# Patient Record
Sex: Female | Born: 1985 | Race: White | Hispanic: No | Marital: Married | Smoking: Former smoker
Health system: Southern US, Community
[De-identification: ages and names within clinical notes are randomized; demographics above are authoritative.]

## PROBLEM LIST (undated history)

## (undated) ENCOUNTER — Inpatient Hospital Stay (HOSPITAL_COMMUNITY): Payer: Self-pay

## (undated) DIAGNOSIS — O24419 Gestational diabetes mellitus in pregnancy, unspecified control: Secondary | ICD-10-CM

## (undated) DIAGNOSIS — F329 Major depressive disorder, single episode, unspecified: Secondary | ICD-10-CM

## (undated) DIAGNOSIS — F411 Generalized anxiety disorder: Secondary | ICD-10-CM

## (undated) DIAGNOSIS — R87810 Cervical high risk human papillomavirus (HPV) DNA test positive: Secondary | ICD-10-CM

## (undated) DIAGNOSIS — R55 Syncope and collapse: Secondary | ICD-10-CM

## (undated) DIAGNOSIS — Z87891 Personal history of nicotine dependence: Secondary | ICD-10-CM

## (undated) DIAGNOSIS — I4510 Unspecified right bundle-branch block: Secondary | ICD-10-CM

## (undated) DIAGNOSIS — Z8759 Personal history of other complications of pregnancy, childbirth and the puerperium: Secondary | ICD-10-CM

## (undated) DIAGNOSIS — I491 Atrial premature depolarization: Secondary | ICD-10-CM

## (undated) DIAGNOSIS — F419 Anxiety disorder, unspecified: Secondary | ICD-10-CM

## (undated) DIAGNOSIS — F32A Depression, unspecified: Secondary | ICD-10-CM

## (undated) DIAGNOSIS — O24415 Gestational diabetes mellitus in pregnancy, controlled by oral hypoglycemic drugs: Secondary | ICD-10-CM

## (undated) DIAGNOSIS — F988 Other specified behavioral and emotional disorders with onset usually occurring in childhood and adolescence: Secondary | ICD-10-CM

## (undated) DIAGNOSIS — R87612 Low grade squamous intraepithelial lesion on cytologic smear of cervix (LGSIL): Secondary | ICD-10-CM

## (undated) HISTORY — DX: Low grade squamous intraepithelial lesion on cytologic smear of cervix (LGSIL): R87.612

## (undated) HISTORY — DX: Other specified behavioral and emotional disorders with onset usually occurring in childhood and adolescence: F98.8

## (undated) HISTORY — DX: Unspecified right bundle-branch block: I45.10

## (undated) HISTORY — DX: Atrial premature depolarization: I49.1

## (undated) HISTORY — DX: Syncope and collapse: R55

## (undated) HISTORY — DX: Depression, unspecified: F32.A

## (undated) HISTORY — DX: Major depressive disorder, single episode, unspecified: F32.9

## (undated) HISTORY — DX: Cervical high risk human papillomavirus (HPV) DNA test positive: R87.810

## (undated) HISTORY — DX: Gestational diabetes mellitus in pregnancy, unspecified control: O24.419

## (undated) HISTORY — DX: Anxiety disorder, unspecified: F41.9

## (undated) HISTORY — DX: Personal history of nicotine dependence: Z87.891

## (undated) HISTORY — DX: Gestational diabetes mellitus in pregnancy, controlled by oral hypoglycemic drugs: O24.415

## (undated) HISTORY — DX: Generalized anxiety disorder: F41.1

## (undated) HISTORY — PX: FRACTURE SURGERY: SHX138

---
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
## 2007-12-20 ENCOUNTER — Ambulatory Visit: Payer: Self-pay | Admitting: Psychiatry

## 2007-12-20 ENCOUNTER — Other Ambulatory Visit: Payer: Self-pay

## 2008-09-14 ENCOUNTER — Ambulatory Visit: Payer: Self-pay | Admitting: Psychiatry

## 2010-01-09 ENCOUNTER — Emergency Department: Payer: Self-pay | Admitting: Emergency Medicine

## 2012-05-09 ENCOUNTER — Ambulatory Visit (INDEPENDENT_AMBULATORY_CARE_PROVIDER_SITE_OTHER): Payer: BC Managed Care – PPO | Admitting: Family Medicine

## 2012-05-09 VITALS — BP 100/68 | HR 83 | Temp 98.9°F | Resp 16 | Ht 66.0 in | Wt 126.0 lb

## 2012-05-09 DIAGNOSIS — IMO0001 Reserved for inherently not codable concepts without codable children: Secondary | ICD-10-CM

## 2012-05-09 DIAGNOSIS — Z0000 Encounter for general adult medical examination without abnormal findings: Secondary | ICD-10-CM

## 2012-05-09 LAB — POCT URINALYSIS DIPSTICK
Bilirubin, UA: NEGATIVE
Blood, UA: NEGATIVE
Glucose, UA: NEGATIVE
Ketones, UA: NEGATIVE
Leukocytes, UA: NEGATIVE
Nitrite, UA: NEGATIVE
Protein, UA: NEGATIVE
Spec Grav, UA: 1.015
Urobilinogen, UA: 1
pH, UA: 7.5

## 2012-05-09 LAB — POCT CBC
Granulocyte percent: 64.8 %G (ref 37–80)
HCT, POC: 46.3 % (ref 37.7–47.9)
Hemoglobin: 14.6 g/dL (ref 12.2–16.2)
Lymph, poc: 2.6 (ref 0.6–3.4)
MCH, POC: 30.2 pg (ref 27–31.2)
MCHC: 31.5 g/dL — AB (ref 31.8–35.4)
MCV: 95.9 fL (ref 80–97)
MID (cbc): 0.5 (ref 0–0.9)
MPV: 9.8 fL (ref 0–99.8)
POC Granulocyte: 5.7 (ref 2–6.9)
POC LYMPH PERCENT: 29.9 %L (ref 10–50)
POC MID %: 5.3 %M (ref 0–12)
Platelet Count, POC: 288 10*3/uL (ref 142–424)
RBC: 4.83 M/uL (ref 4.04–5.48)
RDW, POC: 12.5 %
WBC: 8.8 10*3/uL (ref 4.6–10.2)

## 2012-05-09 LAB — COMPREHENSIVE METABOLIC PANEL
ALT: 9 U/L (ref 0–35)
AST: 13 U/L (ref 0–37)
Albumin: 4.5 g/dL (ref 3.5–5.2)
Alkaline Phosphatase: 38 U/L — ABNORMAL LOW (ref 39–117)
BUN: 10 mg/dL (ref 6–23)
CO2: 30 mEq/L (ref 19–32)
Calcium: 9.6 mg/dL (ref 8.4–10.5)
Chloride: 103 mEq/L (ref 96–112)
Creat: 0.63 mg/dL (ref 0.50–1.10)
Glucose, Bld: 74 mg/dL (ref 70–99)
Potassium: 4.2 mEq/L (ref 3.5–5.3)
Sodium: 139 mEq/L (ref 135–145)
Total Bilirubin: 0.3 mg/dL (ref 0.3–1.2)
Total Protein: 7.1 g/dL (ref 6.0–8.3)

## 2012-05-09 LAB — LIPID PANEL
Cholesterol: 124 mg/dL (ref 0–200)
HDL: 47 mg/dL (ref >39)
LDL Cholesterol: 63 mg/dL (ref 0–99)
Total CHOL/HDL Ratio: 2.6 Ratio
Triglycerides: 69 mg/dL (ref <150)
VLDL: 14 mg/dL (ref 0–40)

## 2012-05-09 LAB — POCT URINE PREGNANCY: Preg Test, Ur: NEGATIVE

## 2012-05-09 MED ORDER — NORETHINDRONE ACET-ETHINYL EST 1.5-30 MG-MCG PO TABS: 1.0000 | ORAL_TABLET | Freq: Every day | ORAL | Status: DC

## 2012-05-09 NOTE — Progress Notes (Signed)
@[facility] @  Patient ID: Zoe Wiley MRN: [id number], DOB: 1986-02-24, 26 y.o. Date of Encounter: 05/09/2012, 3:45 PM  Primary Physician: No primary provider on file.  Chief Complaint: Physical (CPE)  HPI: 26 y.o. y/o female with history of noted below here for CPE.  Doing well. C/o spotting midcycle x 2 cycles, no cramps  LMP: 10 days ago, spotting now Pap:  1 year ago MMG:  N/A Review of Systems: Consitutional: No fever, chills, fatigue, night sweats, lymphadenopathy, or weight changes. Eyes: No visual changes, eye redness, or discharge. ENT/Mouth: Ears: No otalgia, tinnitus, hearing loss, discharge. Nose: No congestion, rhinorrhea, sinus pain, or epistaxis. Throat: No sore throat, post nasal drip, or teeth pain. Cardiovascular: No CP, palpitations, diaphoresis, DOE, edema, orthopnea, PND. Respiratory: No cough, hemoptysis, SOB, or wheezing. Gastrointestinal: No anorexia, dysphagia, reflux, pain, nausea, vomiting, hematemesis, diarrhea, constipation, BRBPR, or melena. Breast: No discharge, pain, swelling, or mass. Genitourinary: No dysuria, frequency, urgency, hematuria, incontinence, nocturia, amenorrhea, vaginal discharge, pruritis, burning, abnormal bleeding, or pain. Musculoskeletal: No decreased ROM, myalgias, stiffness, joint swelling, or weakness. Skin: No rash, erythema, lesion changes, pain, warmth, jaundice, or pruritis. Neurological: No headache, dizziness, syncope, seizures, tremors, memory loss, coordination problems, or paresthesias. Psychological: No anxiety, depression, hallucinations, SI/HI. Endocrine: No fatigue, polydipsia, polyphagia, polyuria, or known diabetes. All other systems were reviewed and are otherwise negative.  No past medical history on file.   No past surgical history on file.  Home Meds:  Prior to Admission medications   Medication Sig Start Date End Date Taking? Authorizing Provider  amphetamine-dextroamphetamine (ADDERALL) 30 MG tablet Take  30 mg by mouth as needed.   Yes Historical Provider, MD  clonazePAM (KLONOPIN) 0.5 MG tablet Take 0.5 mg by mouth 2 (two) times daily as needed.   Yes Historical Provider, MD  Norethindrone Acetate-Ethinyl Estradiol (JUNEL,LOESTRIN,MICROGESTIN) 1.5-30 MG-MCG tablet Take 1 tablet by mouth daily. 05/09/12 05/09/13  Elvina Sidle, MD    Allergies: No Known Allergies  History   Social History  . Marital Status: Single    Spouse Name: N/A    Number of Children: N/A  . Years of Education: N/A   Occupational History  . Not on file.   Social History Main Topics  . Smoking status: Current Everyday Smoker -- 1.0 packs/day    Types: Cigarettes  . Smokeless tobacco: Not on file  . Alcohol Use: Not on file  . Drug Use: Not on file  . Sexually Active: Not on file   Other Topics Concern  . Not on file   Social History Narrative  . No narrative on file    No family history on file.  Physical Exam: Blood pressure 100/68, pulse 83, temperature 98.9 F (37.2 C), temperature source Oral, resp. rate 16, height 5\' 6"  (1.676 m), weight 126 lb (57.153 kg), last menstrual period 05/08/2012, SpO2 100.00%., Body mass index is 20.34 kg/(m^2). General: Well developed, well nourished, in no acute distress. HEENT: Normocephalic, atraumatic. Conjunctiva pink, sclera non-icteric. Pupils 2 mm constricting to 1 mm, round, regular, and equally reactive to light and accomodation. EOMI. Internal auditory canal clear. TMs with good cone of light and without pathology. Nasal mucosa pink. Nares are without discharge. No sinus tenderness. Oral mucosa pink. Dentition good. Pharynx without exudate.   Neck: Supple. Trachea midline. No thyromegaly. Full ROM. No lymphadenopathy. Lungs: Clear to auscultation bilaterally without wheezes, rales, or rhonchi. Breathing is of normal effort and unlabored. Cardiovascular: RRR with S1 S2. No murmurs, rubs, or gallops appreciated. Distal pulses 2+  symmetrically. No carotid or  abdominal bruit Abdomen: Soft, non-tender, non-distended with normoactive bowel sounds. No hepatosplenomegaly or masses. No rebound/guarding. No CVA tenderness. Without hernias.  Genitourinary:  External genitalia without lesions. Vaginal mucosa pink. Cervix pink and without discharge. No cervical or adnexal tenderness. Pap smear taken Musculoskeletal: Full range of motion and 5/5 strength throughout. Without swelling, atrophy, tenderness, crepitus, or warmth. Extremities without clubbing, cyanosis, or edema. Calves supple. Skin: Warm and moist without erythema, ecchymosis, wounds, or rash. Neuro: A+Ox3. CN II-XII grossly intact. Moves all extremities spontaneously. Full sensation throughout. Normal gait. DTR 2+ throughout upper and lower extremities. Finger to nose intact. Psych:  Responds to questions appropriately with a normal affect.   Studies:  Patient is healthy except for the spotting   Assessment/Plan:  26 y.o. y/o female here for CPE 1. Contraception  Norethindrone Acetate-Ethinyl Estradiol (JUNEL,LOESTRIN,MICROGESTIN) 1.5-30 MG-MCG tablet, POCT CBC, Comprehensive metabolic panel  2. Routine general medical examination at a health care facility  POCT CBC, Comprehensive metabolic panel, POCT urinalysis dipstick, POCT urine pregnancy, Pap IG w/ reflex to HPV when ASC-U    -  Signed, Elvina Sidle, MD 05/09/2012 3:45 PM

## 2012-05-10 ENCOUNTER — Encounter: Payer: Self-pay | Admitting: Family Medicine

## 2012-05-10 LAB — PAP IG W/ RFLX HPV ASCU

## 2012-06-21 ENCOUNTER — Telehealth: Payer: Self-pay

## 2012-06-21 NOTE — Telephone Encounter (Signed)
Please pull chart.

## 2012-06-21 NOTE — Telephone Encounter (Signed)
PT STATES SHE CANNOT GET IN TO SEE HER DR UNTIL October, BUT IS OUT OF HER MEDS AND WOULD LIKE TO KNOW IF WE WOULD GIVE HER THE ADDERALL 10MG S AND THE ADDERALL TIME RELEASE XR 30MG S, ALONG WITH HER KOLONOPIN PLEASE CALL [phone]

## 2012-06-22 NOTE — Telephone Encounter (Signed)
No paper chart, epic only.

## 2012-06-22 NOTE — Telephone Encounter (Signed)
We have never seen you for this before. Sorry not without a formal evaluation.

## 2012-06-22 NOTE — Telephone Encounter (Signed)
LMOM for pt that we would be happy to see her and eval her for these medications and gave her our walk -in hours, but explained that we can not write these Rxs for her w/out ever having seen her for this before.

## 2012-06-25 ENCOUNTER — Ambulatory Visit (INDEPENDENT_AMBULATORY_CARE_PROVIDER_SITE_OTHER): Payer: BC Managed Care – PPO | Admitting: Internal Medicine

## 2012-06-25 VITALS — BP 117/73 | HR 77 | Temp 98.7°F | Resp 16 | Ht 66.0 in | Wt 129.0 lb

## 2012-06-25 DIAGNOSIS — F988 Other specified behavioral and emotional disorders with onset usually occurring in childhood and adolescence: Secondary | ICD-10-CM

## 2012-06-25 DIAGNOSIS — F411 Generalized anxiety disorder: Secondary | ICD-10-CM

## 2012-06-25 HISTORY — DX: Generalized anxiety disorder: F41.1

## 2012-06-25 MED ORDER — CLONAZEPAM 0.5 MG PO TABS: 0.5000 mg | ORAL_TABLET | Freq: Two times a day (BID) | ORAL | Status: DC | PRN

## 2012-06-25 MED ORDER — AMPHETAMINE-DEXTROAMPHET ER 30 MG PO CP24: 30.0000 mg | ORAL_CAPSULE | ORAL | Status: DC

## 2012-06-25 MED ORDER — AMPHETAMINE-DEXTROAMPHETAMINE 10 MG PO TABS: 10.0000 mg | ORAL_TABLET | Freq: Every day | ORAL | Status: DC

## 2012-06-25 NOTE — Progress Notes (Signed)
  Subjective:    Patient ID: Zoe Wiley, female    DOB: April 27, 1986, 26 y.o.   MRN: [id number]  HPIHere for followup for problems= Patient Active Problem List  Diagnosis  . ADD (attention deficit disorder)  . Generalized anxiety disorder  Diagnosed by psychiatry in [place] about 18 months ago after a stormy time With anxiety and depression And poor school performance. Problems with her mom in the aftermath of the divorce. Mom also with cerebral palsy and cervical disc disease needing support. Zoe Wiley Was considered as ADD in elementary school but mom refused medications. She had mediocre performance through high school. She dropped out after 3 years at [employer]. She returned to [employer] years ago but did poorly and was placed on academic probation until her eventual presentation to psychiatrist Theador Hawthorne. Since being on medication for attention deficit disorder she has had straight A's and made the Dean's list every time. Her anxiety has greatly improved. She initially did not respond to 4 different SSRIs or other medications but did respond to Klonopin twice a day. She now only uses this once in the morning before work. 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 She is nearing the end of the business degree at [employer]. She is also now a good relationship for over one year No signs of depression/anxiety only about work/no insomnia Adderall 30 XRT the a.m. With occasional 10 mg doses in the p.m. Her heavy days of work. Her college curriculum is now on line.    Past medical history-recent physical examination here documents everything/all normal  Review of Systems     Objective:   Physical Exam In no acute distress Normal vital signs Neuropsychiatric stable       Assessment & Plan:  Problem #1 attention deficit disorder Problem #2 generalized anxiety secondary to situation   Her psychiatrist is moving and  she needs followup here Meds ordered this encounter  Medications  . amphetamine-dextroamphetamine (ADDERALL XR) 30 MG 24 hr capsule    Sig: Take 1 capsule (30 mg total) by mouth every morning.    Dispense:  30 capsule    Refill:  0  . amphetamine-dextroamphetamine (ADDERALL XR) 30 MG 24 hr capsule    Sig: Take 1 capsule (30 mg total) by mouth every morning.    Dispense:  30 capsule    Refill:  0   4 after 07/25/2012  . amphetamine-dextroamphetamine (ADDERALL XR) 30 MG 24 hr capsule    Sig: Take 1 capsule (30 mg total) by mouth every morning.    Dispense:  30 capsule    Refill:  04 after 08/25/2012  . amphetamine-dextroamphetamine (ADDERALL) 10 MG tablet    Sig: Take 1 tablet (10 mg total) by mouth daily.    Dispense:  30 tablet    Refill:  0  . amphetamine-dextroamphetamine (ADDERALL) 10 MG tablet    Sig: Take 1 tablet (10 mg total) by mouth daily.    Dispense:  30 tablet    Refill:  0Full       4 after 07/25/2012  . clonazePAM (KLONOPIN) 0.5 MG tablet    Sig: Take 1 tablet (0.5 mg total) by mouth 2 (two) times daily as needed for anxiety.    Dispense:  60 tablet    Refill:  2   Followup 3 months or 104

## 2012-06-27 NOTE — Progress Notes (Signed)
Left msg for pt to call to schedule appt 09/14/12. Zoe Wiley

## 2012-09-14 ENCOUNTER — Encounter: Payer: Self-pay | Admitting: Internal Medicine

## 2012-09-14 ENCOUNTER — Ambulatory Visit (INDEPENDENT_AMBULATORY_CARE_PROVIDER_SITE_OTHER): Payer: BC Managed Care – PPO | Admitting: Internal Medicine

## 2012-09-14 VITALS — BP 110/74 | HR 89 | Temp 98.1°F | Resp 16 | Ht 66.0 in | Wt 125.4 lb

## 2012-09-14 DIAGNOSIS — F988 Other specified behavioral and emotional disorders with onset usually occurring in childhood and adolescence: Secondary | ICD-10-CM

## 2012-09-14 DIAGNOSIS — F419 Anxiety disorder, unspecified: Secondary | ICD-10-CM

## 2012-09-14 DIAGNOSIS — F411 Generalized anxiety disorder: Secondary | ICD-10-CM

## 2012-09-14 MED ORDER — AMPHETAMINE-DEXTROAMPHET ER 30 MG PO CP24: 30.0000 mg | ORAL_CAPSULE | ORAL | Status: DC

## 2012-09-14 MED ORDER — AMPHETAMINE-DEXTROAMPHET ER 30 MG PO CP24
30.0000 mg | ORAL_CAPSULE | ORAL | Status: DC
Start: 2012-09-14 — End: 2013-04-03

## 2012-09-14 MED ORDER — CLONAZEPAM 0.5 MG PO TABS: 0.5000 mg | ORAL_TABLET | Freq: Two times a day (BID) | ORAL | Status: DC | PRN

## 2012-09-14 MED ORDER — AMPHETAMINE-DEXTROAMPHETAMINE 10 MG PO TABS
10.0000 mg | ORAL_TABLET | Freq: Every day | ORAL | Status: DC
Start: 2012-09-14 — End: 2012-11-03

## 2012-09-14 MED ORDER — AMPHETAMINE-DEXTROAMPHETAMINE 10 MG PO TABS: 10.0000 mg | ORAL_TABLET | Freq: Every day | ORAL | Status: DC

## 2012-09-14 NOTE — Progress Notes (Signed)
  Subjective:    Patient ID: Zoe Wiley, female    DOB: March 22, 1986, 26 y.o.   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 in the business program expecting to graduate in 2-3 years unless she can accelerate her course load Her anxiety is improving slowly over these past few years as she comes to grips with growing up with an alcoholic mother and having an unstable family/she uses Klonopin 5 or 6 times a week occasionally before work and occasionally for sleep  Stable relationship Arts and crafts are her passion [employer]-bored with her job and hopes to make changes as she graduates Continues to [employer] ready to change Review of Systems No vision changes or headaches No tremor No chest pain or palpitations No weight loss Adderall does not change appetite or sleep habits No irritability    Objective:   Physical Exam Vital signs stable Pupils equal round reactive to light and accommodation/EOMs conjugate Heart regular Neurological intact Psychological intact     Assessment & Plan:   1. ADD (attention deficit disorder)  amphetamine-dextroamphetamine (ADDERALL XR) 30 MG 24 hr capsule, amphetamine-dextroamphetamine (ADDERALL XR) 30 MG 24 hr capsule,for 10/14/12 amphetamine-dextroamphetamine (ADDERALL XR) 30 MG 24 hr capsule,for2/3/14 amphetamine-dextroamphetamine (ADDERALL) 10 MG tablet, amphetamine-dextroamphetamine (ADDERALL) 10 MG tablet for after 10/14/12  2. Anxiety  clonazePAM (KLONOPIN) 0.5 MG tablet 2 ref  3.  Nic addic to be addressed at next OV  If doing well in 3 months she may call for refills of all her medications for another 3 months with followup in 6 months

## 2012-10-24 NOTE — Progress Notes (Signed)
Completed prior auth for pt's Adderall 10 mg and Adderall XR 30 mg over the phone and received approval for 1 year. Faxed notice of approval to pharmacy.

## 2012-11-03 ENCOUNTER — Ambulatory Visit (INDEPENDENT_AMBULATORY_CARE_PROVIDER_SITE_OTHER): Payer: BC Managed Care – PPO | Admitting: Physician Assistant

## 2012-11-03 VITALS — BP 115/75 | HR 76 | Temp 98.5°F | Resp 16 | Ht 66.0 in | Wt 125.0 lb

## 2012-11-03 DIAGNOSIS — R1110 Vomiting, unspecified: Secondary | ICD-10-CM

## 2012-11-03 DIAGNOSIS — J111 Influenza due to unidentified influenza virus with other respiratory manifestations: Secondary | ICD-10-CM

## 2012-11-03 DIAGNOSIS — R110 Nausea: Secondary | ICD-10-CM

## 2012-11-03 DIAGNOSIS — R5383 Other fatigue: Secondary | ICD-10-CM

## 2012-11-03 DIAGNOSIS — R5381 Other malaise: Secondary | ICD-10-CM

## 2012-11-03 LAB — POCT CBC
Granulocyte percent: 53.8 %G (ref 37–80)
MID (cbc): 0.4 (ref 0–0.9)
MPV: 9.7 fL (ref 0–99.8)
POC Granulocyte: 3.7 (ref 2–6.9)
POC LYMPH PERCENT: 40.4 %L (ref 10–50)
Platelet Count, POC: 238 10*3/uL (ref 142–424)
RBC: 4.26 M/uL (ref 4.04–5.48)
RDW, POC: 12.6 %

## 2012-11-03 LAB — POCT INFLUENZA A/B
Influenza A, POC: NEGATIVE
Influenza B, POC: NEGATIVE

## 2012-11-03 MED ORDER — OSELTAMIVIR PHOSPHATE 75 MG PO CAPS: 75.0000 mg | ORAL_CAPSULE | Freq: Two times a day (BID) | ORAL | Status: DC

## 2012-11-03 MED ORDER — ONDANSETRON 4 MG PO TBDP
4.0000 mg | ORAL_TABLET | Freq: Once | ORAL | Status: AC
  Administered 2012-11-03: 4 mg via ORAL

## 2012-11-03 MED ORDER — ONDANSETRON 4 MG PO TBDP: 4.0000 mg | ORAL_TABLET | Freq: Three times a day (TID) | ORAL | Status: DC | PRN

## 2012-11-03 MED ORDER — ONDANSETRON 4 MG PO TBDP: 8.0000 mg | ORAL_TABLET | Freq: Once | ORAL | Status: DC

## 2012-11-03 NOTE — Progress Notes (Signed)
Patient ID: Zoe Wiley MRN: [id number], DOB: [birth year], 27 y.o. Date of Encounter: 11/03/2012, 3:43 PM  Primary Physician: No primary provider on file.  Chief Complaint: Fatigue, congestion, and myalgias for one day  HPI: 27 y.o. year old female with history below presents with nasal congestion, fatigue, myalgias, and emesis for one day. Patient woke this morning with sudden onset of the above. Tried to go to work, but could not make it through the day. Afebrile. No chills. Complains of nasal congestion, rhinorrhea, and sinus pressure. No sore throat or cough. No shortness of breath or wheezing. No chest pain. One episode of of emesis earlier today. Still with some nausea, without recent emesis though. One episode of diarrhea this afternoon prior to her arrival here. Multiple sick contacts at work with similar symptoms and with influenza. She did not receive the flu vaccine this year. She refuses to get the flu shot since 2009 secondary to getting H1N1 that year after getting the vaccine. She has taken Dayquil today without any relief.    Past Medical History  Diagnosis Date  . Anxiety      Home Meds: Prior to Admission medications   Medication Sig Start Date End Date Taking? Authorizing Provider  amphetamine-dextroamphetamine (ADDERALL XR) 30 MG 24 hr capsule Take 1 capsule (30 mg total) by mouth every morning. 09/14/12  Yes Tonye Pearson, MD  amphetamine-dextroamphetamine (ADDERALL) 10 MG tablet Take 1 tablet (10 mg total) by mouth daily. 09/14/12  Yes Tonye Pearson, MD  clonazePAM (KLONOPIN) 0.5 MG tablet Take 0.5 mg by mouth once.   Yes Historical Provider, MD         Norethindrone Acetate-Ethinyl Estradiol (JUNEL,LOESTRIN,MICROGESTIN) 1.5-30 MG-MCG tablet Take 1 tablet by mouth daily. 05/09/12 05/09/13  Elvina Sidle, MD    Allergies: No Known Allergies  History   Social History  . Marital Status: Single    Spouse Name: N/A    Number of Children: N/A  . Years of  Education: N/A   Occupational History  . Not on file.   Social History Main Topics  . Smoking status: Current Every Day Smoker -- 0.5 packs/day    Types: Cigarettes  . Smokeless tobacco: Not on file  . Alcohol Use: Not on file  . Drug Use: Not on file  . Sexually Active: Not on file   Other Topics Concern  . Not on file   Social History Narrative  . No narrative on file     Review of Systems: Constitutional: positive for fatigue. negative for chills, fever, night sweats, or weight changes HEENT: see above Cardiovascular: negative for chest pain or palpitations Respiratory: negative for hemoptysis, wheezing, shortness of breath, or cough Abdominal: positive for nausea, vomiting, and diarrhea. negative for abdominal pain, BRBPR, or melena Dermatological: negative for rash Neurologic: positive for headache negative for dizziness, or syncope All other systems reviewed and are otherwise negative with the exception to those above and in the HPI.   Physical Exam: Blood pressure 115/75, pulse 76, temperature 98.5 F (36.9 C), temperature source Oral, resp. rate 16, height 5\' 6"  (1.676 m), weight 125 lb (56.7 kg), SpO2 100.00%., Body mass index is 20.18 kg/(m^2). General: Well developed, well nourished, in no acute distress. Not toxic appearing.  Head: Normocephalic, atraumatic, eyes without discharge, sclera non-icteric, nares are congested. Bilateral auditory canals clear, TM's are without perforation, pearly grey and translucent with reflective cone of light bilaterally. Serous effusion bilaterally. Oral cavity moist, posterior pharynx with post nasal drip. No  exudate, erythema, or peritonsillar abscess. Uvula midline.  Neck: Supple. No thyromegaly. Full ROM. No lymphadenopathy. No nuchal rigidity.  Lungs: Clear bilaterally to auscultation without wheezes, rales, or rhonchi. Breathing is unlabored. Heart: RRR with S1 S2. No murmurs, rubs, or gallops appreciated. Msk:  Strength and  tone normal for age. Extremities/Skin: Warm and dry. No clubbing or cyanosis. No edema. No rashes or suspicious lesions. Neuro: Alert and oriented X 3. Moves all extremities spontaneously. Gait is normal. CNII-XII grossly in tact. Psych:  Responds to questions appropriately with a normal affect.   Labs: Results for orders placed in visit on 11/03/12  POCT CBC      Component Value Range   WBC 6.9  4.6 - 10.2 K/uL   Lymph, poc 2.8  0.6 - 3.4   POC LYMPH PERCENT 40.4  10 - 50 %L   MID (cbc) 0.4  0 - 0.9   POC MID % 5.8  0 - 12 %M   POC Granulocyte 3.7  2 - 6.9   Granulocyte percent 53.8  37 - 80 %G   RBC 4.26  4.04 - 5.48 M/uL   Hemoglobin 12.8  12.2 - 16.2 g/dL   HCT, POC 14.7  82.9 - 47.9 %   MCV 94.6  80 - 97 fL   MCH, POC 30.0  27 - 31.2 pg   MCHC 31.8  31.8 - 35.4 g/dL   RDW, POC 56.2     Platelet Count, POC 238  142 - 424 K/uL   MPV 9.7  0 - 99.8 fL  POCT INFLUENZA A/B      Component Value Range   Influenza A, POC Negative     Influenza B, POC Negative       ASSESSMENT AND PLAN:  27 y.o. year old female with ILI, nausea, emesis, and diarrhea. -Empiric treatment with Tamiflu 75 mg 1 po bid #10 no RF -Zofran ODT 4 mg x 2 given in office -Fluids -Rest -Out of work 11/04/12 -Return to clinic precautions  Signed, Eula Listen, PA-C 11/03/2012 3:43 PM

## 2012-11-16 ENCOUNTER — Telehealth: Payer: Self-pay

## 2012-11-16 NOTE — Telephone Encounter (Signed)
PT SAYS SHE LOST HER SCRIPT FOR CLONAZEPAM AND WANTS TO KNOW IF [facility] WILL CALL HER IN A REFILL. [phone]

## 2012-11-20 MED ORDER — CLONAZEPAM 0.5 MG PO TABS: 0.5000 mg | ORAL_TABLET | Freq: Once | ORAL | Status: DC

## 2012-11-20 NOTE — Telephone Encounter (Signed)
Pt notified that rx was sent to pharm.

## 2012-11-20 NOTE — Telephone Encounter (Signed)
Meds ordered this encounter  Medications  . clonazePAM (KLONOPIN) 0.5 MG tablet    Sig: Take 1 tablet (0.5 mg total) by mouth once.    Dispense:  30 tablet    Refill:  2

## 2012-12-12 ENCOUNTER — Telehealth: Payer: Self-pay | Admitting: Radiology

## 2012-12-12 NOTE — Telephone Encounter (Signed)
Patient states the Klonopin that was filled was for #30 she is requesting quantity of #60 please advise.

## 2012-12-12 NOTE — Telephone Encounter (Signed)
Looks like pt has previously been prescribed #60 for BID use, but Dr. Merla Riches will have to address this as he is the prescriber.  I will forward to him

## 2012-12-13 ENCOUNTER — Other Ambulatory Visit: Payer: Self-pay | Admitting: Radiology

## 2012-12-13 MED ORDER — CLONAZEPAM 0.5 MG PO TABS: 0.5000 mg | ORAL_TABLET | Freq: Two times a day (BID) | ORAL | Status: DC | PRN

## 2012-12-13 NOTE — Telephone Encounter (Signed)
She is correct/My mistake  Meds ordered this encounter  Medications  . clonazePAM (KLONOPIN) 0.5 MG tablet    Sig: Take 1 tablet (0.5 mg total) by mouth 2 (two) times daily as needed for anxiety.    Dispense:  60 tablet    Refill:  2

## 2013-04-03 ENCOUNTER — Ambulatory Visit: Payer: BC Managed Care – PPO | Admitting: Internal Medicine

## 2013-04-03 VITALS — BP 104/72 | HR 82 | Temp 98.1°F | Resp 16 | Ht 66.0 in | Wt 126.8 lb

## 2013-04-03 DIAGNOSIS — F411 Generalized anxiety disorder: Secondary | ICD-10-CM

## 2013-04-03 DIAGNOSIS — F988 Other specified behavioral and emotional disorders with onset usually occurring in childhood and adolescence: Secondary | ICD-10-CM

## 2013-04-03 DIAGNOSIS — F17200 Nicotine dependence, unspecified, uncomplicated: Secondary | ICD-10-CM

## 2013-04-03 MED ORDER — AMPHETAMINE-DEXTROAMPHET ER 30 MG PO CP24: 30.0000 mg | ORAL_CAPSULE | ORAL | Status: DC

## 2013-04-03 MED ORDER — CLONAZEPAM 0.5 MG PO TABS: 0.5000 mg | ORAL_TABLET | Freq: Two times a day (BID) | ORAL | Status: DC | PRN

## 2013-04-03 MED ORDER — AMPHETAMINE-DEXTROAMPHETAMINE 10 MG PO TABS: 10.0000 mg | ORAL_TABLET | Freq: Every day | ORAL | Status: DC

## 2013-04-03 NOTE — Progress Notes (Signed)
  Subjective:    Patient ID: Zoe Wiley, female    DOB: 14-Nov-1985, 27 y.o.   MRN: [id number]  HPI followup  Patient Active Problem List   Diagnosis Date Noted  . Nicotine addiction--1ppd to 1ppweek//has quit plan- 09/14/2012  . 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 is already selling things on [employer]//good relationship for past 2 years --he is well employed and they are considering eloping  Needing only long-acting Adderall since no longer in school  06/25/2012  . Generalized anxiety disorder--improving /no longer requiring Klonopin daily /has come to terms with her family particularly her mother though still struggles with guilt  06/25/2012   No other issues  Review of Systems No headache or vision changes Chest pain or palpitations No weight loss or fatigue No tremor    Objective:   Physical Exam BP 104/72  Pulse 82  Temp(Src) 98.1 F (36.7 C) (Oral)  Resp 16  Ht 5\' 6"  (1.676 m)  Wt 126 lb 12.8 oz (57.516 kg)  BMI 20.48 kg/m2  SpO2 100%  LMP 03/27/2013 Pupils equal round reactive to light and accommodation/EOMs conjugate  Heart regular Cranial nerves II through XII intact Gait normal Psychiatric normal       Assessment & Plan:  ADD (attention deficit disorder) -  Plan: amphetamine-dextroamphetamine (ADDERALL XR) 30 MG 24 hr capsule, amphetamine-dextroamphetamine (ADDERALL XR) 30 MG 24 hr capsule, amphetamine-dextroamphetamine (ADDERALL XR) 30 MG 24 hr capsule, amphetamine-dextroamphetamine (ADDERALL) 10 MG tablet amphetamine-dextroamphetamine (ADDERALL) 10 MG tablet  Generalized anxiety disorder - Plan: clonazePAM (KLONOPIN) 0.5 MG tablet  Nicotine  addiction--improving  Discussed the value of an ADD brain Call for refills in 3 months in followup in 6 months

## 2013-05-02 ENCOUNTER — Other Ambulatory Visit: Payer: Self-pay | Admitting: Internal Medicine

## 2013-05-03 NOTE — Telephone Encounter (Signed)
Needs office visit.

## 2013-05-30 ENCOUNTER — Other Ambulatory Visit: Payer: Self-pay | Admitting: Physician Assistant

## 2013-06-05 ENCOUNTER — Telehealth: Payer: Self-pay

## 2013-06-05 MED ORDER — NORETHINDRONE ACET-ETHINYL EST 1.5-30 MG-MCG PO TABS: 1.0000 | ORAL_TABLET | Freq: Every day | ORAL | Status: DC

## 2013-06-05 NOTE — Telephone Encounter (Signed)
Sent!

## 2013-06-05 NOTE — Telephone Encounter (Signed)
Pt is scheduled for a CPE with Dr Merla Riches on 08/02/13. Pt is asking if she can get 1 month refill on birth control

## 2013-06-21 ENCOUNTER — Telehealth: Payer: Self-pay

## 2013-06-21 MED ORDER — NORETHINDRONE ACET-ETHINYL EST 1.5-30 MG-MCG PO TABS: 1.0000 | ORAL_TABLET | Freq: Every day | ORAL | Status: DC

## 2013-06-21 NOTE — Telephone Encounter (Signed)
lmom that rx was sent in and to make sure she keeps her appt.

## 2013-06-21 NOTE — Telephone Encounter (Signed)
Patient has an appt with Dr. Merla Riches on Oct.22nd but her birth control runs out this week. Wants to know if she can get an extension on them; one more refill. [facility]. [phone].

## 2013-06-21 NOTE — Telephone Encounter (Signed)
Pended please advise.  

## 2013-06-21 NOTE — Telephone Encounter (Signed)
Sent!

## 2013-06-26 ENCOUNTER — Ambulatory Visit (INDEPENDENT_AMBULATORY_CARE_PROVIDER_SITE_OTHER): Payer: BC Managed Care – PPO | Admitting: Emergency Medicine

## 2013-06-26 VITALS — BP 120/70 | HR 72 | Temp 98.7°F | Resp 16 | Ht 65.0 in | Wt 133.0 lb

## 2013-06-26 DIAGNOSIS — J209 Acute bronchitis, unspecified: Secondary | ICD-10-CM

## 2013-06-26 MED ORDER — AZITHROMYCIN 250 MG PO TABS: ORAL_TABLET | ORAL | Status: DC

## 2013-06-26 MED ORDER — HYDROCOD POLST-CHLORPHEN POLST 10-8 MG/5ML PO LQCR: 5.0000 mL | Freq: Two times a day (BID) | ORAL | Status: DC | PRN

## 2013-06-26 MED ORDER — PSEUDOEPHEDRINE-GUAIFENESIN ER 60-600 MG PO TB12: 1.0000 | ORAL_TABLET | Freq: Two times a day (BID) | ORAL | Status: DC

## 2013-06-26 NOTE — Progress Notes (Signed)
[facility] [address], [place] [address] [phone]  Date:  06/26/2013   Name:  Zoe Wiley   DOB:  1986-01-19   MRN:  [id number]  PCP:  No PCP Per Patient    Chief Complaint: URI   History of Present Illness:  Karuna Balducci is a 27 y.o. very pleasant female patient who presents with the following:  Ill since last week with nasal congestion, sore throat, post nasal drainage and cough productive of a foul tasting sputum.  No wheezing or shortness of breath.  Has frontal headache.  No nausea or vomiting.  No stool change.  No rash.  No improvement with over the counter medications or other home remedies. Denies other complaint or health concern today.   Patient Active Problem List   Diagnosis Date Noted  . Nicotine addiction 09/14/2012  . ADD (attention deficit disorder) 06/25/2012  . Generalized anxiety disorder 06/25/2012    Past Medical History  Diagnosis Date  . Anxiety     Past Surgical History  Procedure Laterality Date  . Fracture surgery      History  Substance Use Topics  . Smoking status: Current Every Day Smoker -- 0.50 packs/day for 7 years    Types: Cigarettes  . Smokeless tobacco: Not on file  . Alcohol Use: Not on file    Family History  Problem Relation Age of Onset  . Heart disease Father     No Known Allergies  Medication list has been reviewed and updated.  Current Outpatient Prescriptions on File Prior to Visit  Medication Sig Dispense Refill  . amphetamine-dextroamphetamine (ADDERALL XR) 30 MG 24 hr capsule Take 1 capsule (30 mg total) by mouth every morning.  30 capsule  0  . amphetamine-dextroamphetamine (ADDERALL XR) 30 MG 24 hr capsule Take 1 capsule (30 mg total) by mouth every morning.  30 capsule  0  . amphetamine-dextroamphetamine (ADDERALL XR) 30 MG 24 hr capsule Take 1 capsule (30 mg total) by mouth every morning.  30 capsule  0  . amphetamine-dextroamphetamine (ADDERALL) 10 MG tablet Take 1 tablet (10  mg total) by mouth daily.  30 tablet  0  . clonazePAM (KLONOPIN) 0.5 MG tablet Take 1 tablet (0.5 mg total) by mouth 2 (two) times daily as needed for anxiety.  60 tablet  2  . Norethindrone Acetate-Ethinyl Estradiol (MICROGESTIN) 1.5-30 MG-MCG tablet Take 1 tablet by mouth daily. PATIENT NEEDS OFFICE VISIT FOR ADDITIONAL REFILLS  21 tablet  1   No current facility-administered medications on file prior to visit.    Review of Systems:  As per HPI, otherwise negative.    Physical Examination: Filed Vitals:   06/26/13 2042  BP: 120/70  Pulse: 72  Temp: 98.7 F (37.1 C)  Resp: 16   Filed Vitals:   06/26/13 2042  Height: 5\' 5"  (1.651 m)  Weight: 133 lb (60.328 kg)   Body mass index is 22.13 kg/(m^2). Ideal Body Weight: Weight in (lb) to have BMI = 25: 149.9  GEN: WDWN, NAD, Non-toxic, A & O x 3 HEENT: Atraumatic, Normocephalic. Neck supple. No masses, No LAD. Ears and Nose: No external deformity. CV: RRR, No M/G/R. No JVD. No thrill. No extra heart sounds. PULM: CTA B, no wheezes, crackles, rhonchi. No retractions. No resp. distress. No accessory muscle use. ABD: S, NT, ND, +BS. No rebound. No HSM. EXTR: No c/c/e NEURO Normal gait.  PSYCH: Normally interactive. Conversant. Not depressed or anxious appearing.  Calm demeanor.  Assessment and Plan: Upper respiratory infection Bronchitis tussionex mucinex d  Signed,  Phillips Odor, MD

## 2013-06-26 NOTE — Patient Instructions (Addendum)

## 2013-07-10 ENCOUNTER — Ambulatory Visit: Payer: BC Managed Care – PPO | Admitting: Family Medicine

## 2013-07-10 VITALS — BP 112/70 | HR 77 | Temp 98.0°F | Resp 16 | Ht 67.0 in | Wt 129.0 lb

## 2013-07-10 DIAGNOSIS — J3489 Other specified disorders of nose and nasal sinuses: Secondary | ICD-10-CM

## 2013-07-10 DIAGNOSIS — J309 Allergic rhinitis, unspecified: Secondary | ICD-10-CM

## 2013-07-10 MED ORDER — AMOXICILLIN 500 MG PO CAPS: 1000.0000 mg | ORAL_CAPSULE | Freq: Two times a day (BID) | ORAL | Status: DC

## 2013-07-10 MED ORDER — FLUTICASONE PROPIONATE 50 MCG/ACT NA SUSP: 2.0000 | Freq: Every day | NASAL | Status: DC

## 2013-07-10 NOTE — Patient Instructions (Addendum)
You probably have allergies. Try using the nasal spray, and also use claritin or zyrtec OTC.  If you are not feeling better in the next few days you can also start the amoxicillin rx (antiboitc).  Please let me know if you are not feeling better soon- Sooner if worse.

## 2013-07-10 NOTE — Progress Notes (Signed)
[facility] [address], [place] [address] [phone]  Date:  07/10/2013   Name:  Zoe Wiley   DOB:  [birth year]   MRN:  [id number]  PCP:  No PCP Per Patient    Chief Complaint: Nasal Congestion and Cough   History of Present Illness:  Zoe Wiley is a 27 y.o. very pleasant female patient who presents with the following:  She is here today for a recheck- see OV from 9/15.  She notes sneezing, congestion, stuffy and runny nose.  She has not noted a fever.  She does have some cough. She was treated with azithromycin, mucinex and tussionex at her last visit.  She felt better while she was using these medications, but now her sx seem to have recurred (as of the last 3 days). No GI symptoms.   Her LMP is current.   Her sx are now more in her sinuses- they had been more in her chest  Patient Active Problem List   Diagnosis Date Noted  . Nicotine addiction 09/14/2012  . ADD (attention deficit disorder) 06/25/2012  . Generalized anxiety disorder 06/25/2012    Past Medical History  Diagnosis Date  . Anxiety     Past Surgical History  Procedure Laterality Date  . Fracture surgery      History  Substance Use Topics  . Smoking status: Current Every Day Smoker -- 0.50 packs/day for 7 years    Types: Cigarettes  . Smokeless tobacco: Not on file  . Alcohol Use: Not on file    Family History  Problem Relation Age of Onset  . Heart disease Father     Not on File  Medication list has been reviewed and updated.  Current Outpatient Prescriptions on File Prior to Visit  Medication Sig Dispense Refill  . amphetamine-dextroamphetamine (ADDERALL XR) 30 MG 24 hr capsule Take 1 capsule (30 mg total) by mouth every morning.  30 capsule  0  . amphetamine-dextroamphetamine (ADDERALL XR) 30 MG 24 hr capsule Take 1 capsule (30 mg total) by mouth every morning.  30 capsule  0  . amphetamine-dextroamphetamine (ADDERALL XR) 30 MG 24 hr capsule Take 1 capsule (30  mg total) by mouth every morning.  30 capsule  0  . amphetamine-dextroamphetamine (ADDERALL) 10 MG tablet Take 1 tablet (10 mg total) by mouth daily.  30 tablet  0  . clonazePAM (KLONOPIN) 0.5 MG tablet Take 1 tablet (0.5 mg total) by mouth 2 (two) times daily as needed for anxiety.  60 tablet  2  . Norethindrone Acetate-Ethinyl Estradiol (MICROGESTIN) 1.5-30 MG-MCG tablet Take 1 tablet by mouth daily. PATIENT NEEDS OFFICE VISIT FOR ADDITIONAL REFILLS  21 tablet  1  . azithromycin (ZITHROMAX) 250 MG tablet Take 2 tabs PO x 1 dose, then 1 tab PO QD x 4 days  6 tablet  0  . chlorpheniramine-HYDROcodone (TUSSIONEX PENNKINETIC ER) 10-8 MG/5ML LQCR Take 5 mLs by mouth every 12 (twelve) hours as needed.  60 mL  0  . pseudoephedrine-guaifenesin (MUCINEX D) 60-600 MG per tablet Take 1 tablet by mouth every 12 (twelve) hours.  18 tablet  0   No current facility-administered medications on file prior to visit.    Review of Systems:  As per HPI- otherwise negative.   Physical Examination: Filed Vitals:   07/10/13 1057  BP: 100/60  Pulse: 77  Temp: 98 F (36.7 C)  Resp: 16   Filed Vitals:   07/10/13 1057  Height: 5\' 7"  (1.702 m)  Weight: 129 lb (58.514 kg)   Body mass index is 20.2 kg/(m^2). Ideal Body Weight: Weight in (lb) to have BMI = 25: 159.3  GEN: WDWN, NAD, Non-toxic, A & O x 3 HEENT: Atraumatic, Normocephalic. Neck supple. No masses, No LAD.  Bilateral TM wnl, oropharynx normal.  PEERL,EOMI.   Nasal cavity is inflamed and sinuses are tender to palpation Ears and Nose: No external deformity. CV: RRR, No M/G/R. No JVD. No thrill. No extra heart sounds. PULM: CTA B, no wheezes, crackles, rhonchi. No retractions. No resp. distress. No accessory muscle use.Marland Kitchen EXTR: No c/c/e NEURO Normal gait.  PSYCH: Normally interactive. Conversant. Not depressed or anxious appearing.  Calm demeanor.    Assessment and Plan: Allergic rhinitis - Plan: fluticasone (FLONASE) 50 MCG/ACT nasal  spray  Sinus pain - Plan: amoxicillin (AMOXIL) 500 MG capsule  Suspect viral sinus infection vs allergies.  Recommend that she try flonase and zyrtec or claritin first- if not better soon can take a course of amox.  Let me know if any problems.    Signed Abbe Amsterdam, MD

## 2013-08-02 ENCOUNTER — Ambulatory Visit (INDEPENDENT_AMBULATORY_CARE_PROVIDER_SITE_OTHER): Payer: BC Managed Care – PPO | Admitting: Internal Medicine

## 2013-08-02 ENCOUNTER — Encounter: Payer: Self-pay | Admitting: Internal Medicine

## 2013-08-02 VITALS — BP 110/72 | HR 73 | Temp 97.9°F | Resp 16 | Ht 65.5 in | Wt 130.0 lb

## 2013-08-02 DIAGNOSIS — F411 Generalized anxiety disorder: Secondary | ICD-10-CM

## 2013-08-02 DIAGNOSIS — Z309 Encounter for contraceptive management, unspecified: Secondary | ICD-10-CM

## 2013-08-02 DIAGNOSIS — D235 Other benign neoplasm of skin of trunk: Secondary | ICD-10-CM

## 2013-08-02 DIAGNOSIS — D225 Melanocytic nevi of trunk: Secondary | ICD-10-CM

## 2013-08-02 DIAGNOSIS — IMO0001 Reserved for inherently not codable concepts without codable children: Secondary | ICD-10-CM

## 2013-08-02 DIAGNOSIS — F988 Other specified behavioral and emotional disorders with onset usually occurring in childhood and adolescence: Secondary | ICD-10-CM

## 2013-08-02 DIAGNOSIS — Z0000 Encounter for general adult medical examination without abnormal findings: Secondary | ICD-10-CM

## 2013-08-02 LAB — POCT URINALYSIS DIPSTICK
Bilirubin, UA: NEGATIVE
Blood, UA: NEGATIVE
Glucose, UA: NEGATIVE
Nitrite, UA: NEGATIVE
Spec Grav, UA: 1.015
pH, UA: 7

## 2013-08-02 MED ORDER — NORETHINDRONE ACET-ETHINYL EST 1.5-30 MG-MCG PO TABS: 1.0000 | ORAL_TABLET | Freq: Every day | ORAL | Status: DC

## 2013-08-02 MED ORDER — AMPHETAMINE-DEXTROAMPHET ER 30 MG PO CP24: 30.0000 mg | ORAL_CAPSULE | ORAL | Status: DC

## 2013-08-02 MED ORDER — CLONAZEPAM 0.5 MG PO TABS: 0.5000 mg | ORAL_TABLET | Freq: Two times a day (BID) | ORAL | Status: DC | PRN

## 2013-08-02 MED ORDER — AMPHETAMINE-DEXTROAMPHETAMINE 10 MG PO TABS: 10.0000 mg | ORAL_TABLET | Freq: Every day | ORAL | Status: DC

## 2013-08-02 MED ORDER — FLUOXETINE HCL 10 MG PO CAPS: 10.0000 mg | ORAL_CAPSULE | Freq: Every day | ORAL | Status: DC

## 2013-08-02 NOTE — Progress Notes (Signed)
Subjective:    Patient ID: Zoe Wiley, female    DOB: [birth year], 27 y.o.   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 Nicotine addiction----Has quit this year!!!! 09/14/2012  . ADD (attention deficit disorder)---meds working well 06/25/2012  . Generalized anxiety disorder 06/25/2012   Anxiety long term-always afr to try meds --reluctant to use more than 1/2 -1 benzo in am incr agitat Snaps at bf Short at work Small panic attacks Unsure why the recent increase  Review of Systems 13point all neg other than pi    Objective:   Physical Exam  Constitutional: She is oriented to person, place, and time. She appears well-developed and well-nourished.  HENT:  Right Ear: External ear normal.  Left Ear: External ear normal.  Nose: Nose normal.  Mouth/Throat: Oropharynx is clear and moist.  Eyes: Conjunctivae and EOM are normal. Pupils are equal, round, and reactive to light.  Neck: Normal range of motion. Neck supple. No thyromegaly present.  Cardiovascular: Normal rate, regular rhythm, normal heart sounds and intact distal pulses.   No murmur heard. Pulmonary/Chest: Breath sounds normal. No respiratory distress. She has no wheezes.    Marland Kitchen8 cm atypical nevus with central clearing and rolled outer edges that are discrete but sl irreg  Both breasts are otherwise normal to exam Small dark nevus lower outer quad L breast as well but more normal appearance  Abdominal: Soft. Bowel sounds are normal. There is no splenomegaly or hepatomegaly. Hernia confirmed negative in the right inguinal area and confirmed negative in the left inguinal area.  Genitourinary: Vagina normal. Pelvic exam was performed with patient supine. There is  no rash or lesion on the right labia. There is no rash or lesion on the left labia.  Uterus midposit NT No adnexal masses or tenderness  Musculoskeletal: Normal range of motion. She exhibits no edema and no tenderness.  Lymphadenopathy:    She has no cervical adenopathy.  Neurological: She is alert and oriented to person, place, and time. She has normal reflexes. No cranial nerve deficit.  Skin: Rash noted.  Small hives and blotches around neck come and go  Psychiatric: She has a normal mood and affect. Her behavior is normal. Judgment and thought content normal.   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 Norethindrone Acetate-Ethinyl Estradiol (MICROGESTIN) 1.5-30 MG-MCG tablet    Sig: Take 1 tablet by mouth daily.  Dispense:  21 tablet    Refill:  1  . clonazePAM (KLONOPIN) 0.5 MG tablet    Sig: Take 1 tablet (0.5 mg total) by mouth 2 (two) times daily as needed for anxiety.    Dispense:  60 tablet    Refill:  2  . amphetamine-dextroamphetamine (ADDERALL XR) 30 MG 24 hr capsule    Sig: Take 1 capsule (30 mg total) by mouth every morning.    Dispense:  30 capsule    Refill:  0  . amphetamine-dextroamphetamine (ADDERALL XR) 30 MG 24 hr capsule    Sig: Take 1 capsule (30 mg  total) by mouth every morning.    Dispense:  30 capsule    Refill:  0  . amphetamine-dextroamphetamine (ADDERALL XR) 30 MG 24 hr capsule    Sig: Take 1 capsule (30 mg total) by mouth every morning.    Dispense:  30 capsule    Refill:  0  . amphetamine-dextroamphetamine (ADDERALL) 10 MG tablet    Sig: Take 1 tablet (10 mg total) by mouth daily.    Dispense:  30 tablet    Refill:  0  . FLUoxetine (PROZAC) 10 MG capsule    Sig: Take 1 capsule (10 mg total) by mouth daily.    Dispense:  30 capsule    Refill:  5

## 2013-08-03 ENCOUNTER — Other Ambulatory Visit: Payer: Self-pay | Admitting: Radiology

## 2013-08-03 ENCOUNTER — Telehealth: Payer: Self-pay | Admitting: Internal Medicine

## 2013-08-03 DIAGNOSIS — D235 Other benign neoplasm of skin of trunk: Secondary | ICD-10-CM

## 2013-08-03 NOTE — Telephone Encounter (Signed)
Left message that dr Irene Limbo at [facility] derm can see her at 230 today [facility] scheduling nurse [phone] option 1

## 2013-08-04 LAB — PAP IG, CT-NG, RFX HPV ASCU: GC Probe Amp: NEGATIVE

## 2013-08-10 ENCOUNTER — Telehealth: Payer: Self-pay | Admitting: Internal Medicine

## 2013-08-10 DIAGNOSIS — IMO0002 Reserved for concepts with insufficient information to code with codable children: Secondary | ICD-10-CM

## 2013-08-10 NOTE — Telephone Encounter (Signed)
i discussed with her

## 2013-09-06 ENCOUNTER — Telehealth: Payer: Self-pay

## 2013-09-06 MED ORDER — FLUOXETINE HCL 20 MG PO TABS: 20.0000 mg | ORAL_TABLET | Freq: Every day | ORAL | Status: DC

## 2013-09-06 NOTE — Telephone Encounter (Signed)
PT STATES DR DOOLITTLE PUT HER ON PROZAC FOR ONCE A DAY AND TOLD HER TO CALL IF SHE NEEDED TO TAKE MORE AND SHE IS TAKING TWICE A DAY AND DIDN'T KNOW IF THE MGS NEEDED TO BE CHANGED. PLEASE CALL [phone]    [facility] ON [address] IN [facility]

## 2013-09-06 NOTE — Telephone Encounter (Signed)
Meds ordered this encounter  Medications  . FLUoxetine (PROZAC) 20 MG tablet    Sig: Take 1 tablet (20 mg total) by mouth daily.    Dispense:  30 tablet    Refill:  3   Can increase again in 1-2 months if needed

## 2013-09-06 NOTE — Telephone Encounter (Signed)
Pended at higher dose please advise.

## 2013-09-08 NOTE — Telephone Encounter (Signed)
Called her to advise, left mssg for her to call me back.

## 2013-09-11 NOTE — Telephone Encounter (Signed)
Called again. Left another, detailed message.

## 2013-10-13 ENCOUNTER — Telehealth: Payer: Self-pay

## 2013-10-13 NOTE — Telephone Encounter (Signed)
Pt would like to increase her prozac from 20 mg to 30 mg. [facility] [address] [employer] [phone]

## 2013-10-13 NOTE — Telephone Encounter (Signed)
Pended please advise.  

## 2013-10-14 MED ORDER — FLUOXETINE HCL 20 MG PO TABS: 30.0000 mg | ORAL_TABLET | Freq: Every day | ORAL | Status: DC

## 2013-10-14 NOTE — Telephone Encounter (Signed)
Ok proz 30--to f/u if questions or in 1-2 months

## 2013-10-16 NOTE — Telephone Encounter (Signed)
Thanks. Called her to advise.

## 2013-11-10 ENCOUNTER — Ambulatory Visit: Payer: BC Managed Care – PPO | Admitting: Internal Medicine

## 2014-01-03 ENCOUNTER — Ambulatory Visit (INDEPENDENT_AMBULATORY_CARE_PROVIDER_SITE_OTHER): Payer: BC Managed Care – PPO | Admitting: Internal Medicine

## 2014-01-03 ENCOUNTER — Encounter: Payer: Self-pay | Admitting: Internal Medicine

## 2014-01-03 VITALS — BP 130/72 | HR 79 | Temp 98.2°F | Resp 16 | Ht 66.5 in | Wt 129.2 lb

## 2014-01-03 DIAGNOSIS — F411 Generalized anxiety disorder: Secondary | ICD-10-CM

## 2014-01-03 DIAGNOSIS — F988 Other specified behavioral and emotional disorders with onset usually occurring in childhood and adolescence: Secondary | ICD-10-CM

## 2014-01-03 MED ORDER — NORETHIN ACE-ETH ESTRAD-FE 1-20 MG-MCG PO TABS: 1.0000 | ORAL_TABLET | Freq: Every day | ORAL | Status: DC

## 2014-01-03 MED ORDER — FLUOXETINE HCL 40 MG PO CAPS: 40.0000 mg | ORAL_CAPSULE | Freq: Every day | ORAL | Status: DC

## 2014-01-03 MED ORDER — FLUOXETINE HCL 20 MG PO TABS: 30.0000 mg | ORAL_TABLET | Freq: Every day | ORAL | Status: DC

## 2014-01-03 MED ORDER — AMPHETAMINE-DEXTROAMPHETAMINE 10 MG PO TABS: 10.0000 mg | ORAL_TABLET | Freq: Two times a day (BID) | ORAL | Status: DC

## 2014-01-03 NOTE — Progress Notes (Signed)
Subjective:    Patient ID: Zoe Wiley, female    DOB: 21-Jun-1986, 28 y.o.   MRN: [id number]  HPI followup Patient Active Problem List   Diagnosis Date Noted  . Generalized anxiety disorder 06/25/2012    Priority: Medium  . ADD (attention deficit disorder) 06/25/2012   30xr=too much now that she is on prozac and her anxiety is well controlled  She has discontinued this and now takes 10 mg in the morning which seems to last all day  Occasionally takes an evening dose  She has no procrastination, no disorganization, no hyperactive or impulsive symptoms   Meds were started by psychiatrist who told her not to be too concerned about the increased   anxiety on this medication  She now but rarely needs Klonopin, maybe once or twice a week Quit smoking oct 2014 Diet soda 2-3/day is current project to discontinue Steady BF--anticipating marriage/ childrearing 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  Eyes: Negative for visual disturbance.  Respiratory: Negative for shortness of breath.   Cardiovascular: Negative for chest pain and palpitations.  Neurological: Negative for headaches.  Psychiatric/Behavioral: Negative for behavioral problems and sleep disturbance.       Objective:   Physical Exam BP 130/72  Pulse 79  Temp(Src) 98.2 F (36.8 C) (Oral)  Resp 16  Ht 5' 6.5" (1.689 m)  Wt 129 lb 3.2 oz (58.605 kg)  BMI 20.54 kg/m2  SpO2 98%  LMP 01/02/2014 HEENT clear/no  thyromegaly Heart regular Neurological intact Mood good/affect appropriate       Assessment & Plan:  Generalized anxiety disorder  Increase Prozac to 40 mg  Change birth control back to original pills-Junel  Continue Klonopin when necessary  ADD (attention deficit disorder)  Discontinue long-acting Adderall and use Adderall 10 mg in the morning/second dose prn  She is encouraged to consider using 5 mg and even discontinuing medication to see if her  attention deficit disorder was not an accurate diagnosis  Meds ordered this encounter  Medications  . DISCONTD: FLUoxetine (PROZAC) 20 MG tablet    Sig: Take 1.5 tablets (30 mg total) by mouth daily.    Dispense:  45 tablet    Refill:  3  . FLUoxetine (PROZAC) 40 MG capsule    Sig: Take 1 capsule (40 mg total) by mouth daily.    Dispense:  90 capsule    Refill:  3  . norethindrone-ethinyl estradiol (JUNEL FE 1/20) 1-20 MG-MCG tablet    Sig: Take 1 tablet by mouth daily.    Dispense:  1 Package    Refill:  11  . amphetamine-dextroamphetamine (ADDERALL) 10 MG tablet    Sig: Take 1 tablet (10 mg total) by mouth 2 (two) times daily.    Dispense:  60 tablet    Refill:  0  . amphetamine-dextroamphetamine (ADDERALL) 10 MG tablet    Sig: Take 1 tablet (10 mg total) by mouth 2 (two) times daily. 30 d from signed date    Dispense:  8 tablet  Refill:  0  . amphetamine-dextroamphetamine (ADDERALL) 10 MG tablet    Sig: Take 1 tablet (10 mg total) by mouth 2 (two) times daily. 60d from signed date    Dispense:  60 tablet    Refill:  0

## 2014-03-19 ENCOUNTER — Ambulatory Visit (INDEPENDENT_AMBULATORY_CARE_PROVIDER_SITE_OTHER): Payer: BC Managed Care – PPO | Admitting: Family Medicine

## 2014-03-19 VITALS — BP 128/72 | HR 83 | Temp 98.2°F | Resp 16 | Ht 65.0 in | Wt 127.4 lb

## 2014-03-19 DIAGNOSIS — T783XXA Angioneurotic edema, initial encounter: Secondary | ICD-10-CM

## 2014-03-19 DIAGNOSIS — H01119 Allergic dermatitis of unspecified eye, unspecified eyelid: Secondary | ICD-10-CM

## 2014-03-19 MED ORDER — HYDROXYZINE HCL 25 MG PO TABS: 12.5000 mg | ORAL_TABLET | Freq: Three times a day (TID) | ORAL | Status: DC | PRN

## 2014-03-19 MED ORDER — CETIRIZINE HCL 10 MG PO TABS: 10.0000 mg | ORAL_TABLET | Freq: Every day | ORAL | Status: DC

## 2014-03-19 MED ORDER — RANITIDINE HCL 150 MG PO TABS: 150.0000 mg | ORAL_TABLET | Freq: Two times a day (BID) | ORAL | Status: DC

## 2014-03-19 MED ORDER — METHYLPREDNISOLONE ACETATE 80 MG/ML IJ SUSP
80.0000 mg | Freq: Once | INTRAMUSCULAR | Status: AC
  Administered 2014-03-19: 80 mg via INTRAMUSCULAR

## 2014-03-19 NOTE — Patient Instructions (Signed)
Angioedema Angioedema is sudden puffiness (swelling), often of the skin. It can happen:  On your face or privates (genitals).  In your belly (abdomen) or other body parts. It usually happens quickly and gets better in 1 or 2 days. It often starts at night and is found when you wake up. You may get red, itchy patches of skin (hives). Attacks can be dangerous if your breathing passages get puffy. The condition may happen only once, or it can come back at random times. It may happen for several years before it goes away for good. HOME CARE  Only take medicines as told by your doctor.  Always carry your emergency allergy medicines with you.  Wear a medical bracelet as told by your doctor.  Avoid things that you know will cause attacks (triggers). GET HELP IF:  You have another attack.  Your attacks happen more often or get worse.  The condition was passed to you by your parents and you want to have children. GET HELP RIGHT AWAY IF:   Your mouth, tongue, or lips are very puffy.  You have trouble breathing.  You have trouble swallowing.  You pass out (faint). MAKE SURE YOU:   Understand these instructions.  Will watch your condition.  Will get help right away if you are not doing well or get worse. Document Released: 09/16/2009 Document Revised: 07/19/2013 Document Reviewed: 05/22/2013 [facility] Patient Information 2014 [place], [place].

## 2014-03-24 ENCOUNTER — Other Ambulatory Visit: Payer: Self-pay | Admitting: Internal Medicine

## 2014-03-24 NOTE — Telephone Encounter (Signed)
PT CALLED IN WANTING A REFILL ON HER clonazePAM (KLONOPIN) 0.5 MG tablet STATED DR. DOOLITTLE TOLD HER TO CALL IN.   PLEASE RETURN HER CALL AT [phone]

## 2014-03-27 NOTE — Telephone Encounter (Signed)
Faxed and notified pt on VM. 

## 2014-05-07 ENCOUNTER — Telehealth: Payer: Self-pay

## 2014-05-07 NOTE — Telephone Encounter (Signed)
amphetamine-dextroamphetamine (ADDERALL) 10 MG tablet   Patient is coming in on Saturday Aug 1st to see Dr. Laney Pastor,  Meanwhile she is out of meds.    [phone]

## 2014-05-08 NOTE — Telephone Encounter (Signed)
Please advise 

## 2014-05-09 MED ORDER — AMPHETAMINE-DEXTROAMPHETAMINE 10 MG PO TABS: 10.0000 mg | ORAL_TABLET | Freq: Two times a day (BID) | ORAL | Status: DC

## 2014-05-09 NOTE — Telephone Encounter (Signed)
Notified pt Rx is ready on VM.

## 2014-06-01 NOTE — Progress Notes (Signed)
Subjective:    Patient ID: Zoe Wiley, female    DOB: 1986-06-03, 28 y.o.   MRN: [id number] Chief Complaint  Patient presents with  . Allergic Reaction    thinks she is having a reaction to the eye make up she tried on x 2 days    HPI  Past Medical History  Diagnosis Date  . Anxiety    Current Outpatient Prescriptions on File Prior to Visit  Medication Sig Dispense Refill  . amphetamine-dextroamphetamine (ADDERALL) 10 MG tablet Take 1 tablet (10 mg total) by mouth 2 (two) times daily. 30 d from signed date  60 tablet  0  . amphetamine-dextroamphetamine (ADDERALL) 10 MG tablet Take 1 tablet (10 mg total) by mouth 2 (two) times daily. 60d from signed date  60 tablet  0  . Norethindrone Acetate-Ethinyl Estradiol (MICROGESTIN) 1.5-30 MG-MCG tablet Take 1 tablet by mouth daily.  21 tablet  11  . norethindrone-ethinyl estradiol (JUNEL FE 1/20) 1-20 MG-MCG tablet Take 1 tablet by mouth daily.  1 Package  11   No current facility-administered medications on file prior to visit.   No Known Allergies   Review of Systems  Constitutional: Negative for fever, chills, diaphoresis, activity change, appetite change and fatigue.  HENT: Positive for congestion. Negative for ear discharge, ear pain, nosebleeds, postnasal drip, rhinorrhea, sinus pressure, sneezing, sore throat, trouble swallowing and voice change.   Eyes: Positive for itching. Negative for photophobia, pain, discharge, redness and visual disturbance.       Lids swollen  Respiratory: Negative for cough and shortness of breath.   Cardiovascular: Negative for chest pain.  Gastrointestinal: Negative for nausea, vomiting and abdominal pain.  Musculoskeletal: Negative for neck pain and neck stiffness.  Skin: Positive for color change and rash.  Neurological: Negative for dizziness, syncope and headaches.  Hematological: Negative for adenopathy.  Psychiatric/Behavioral: Negative for sleep disturbance.       Objective:  BP  128/72  Pulse 83  Temp(Src) 98.2 F (36.8 C) (Oral)  Resp 16  Ht 5\' 5"  (1.651 m)  Wt 127 lb 6.4 oz (57.788 kg)  BMI 21.20 kg/m2  SpO2 100%  LMP 03/13/2014  Physical Exam  Constitutional: She is oriented to person, place, and time. She appears well-developed and well-nourished. She does not appear ill. No distress.  HENT:  Head: Normocephalic and atraumatic.  Right Ear: External ear and ear canal normal. Tympanic membrane is retracted. A middle ear effusion is present.  Left Ear: External ear and ear canal normal. Tympanic membrane is retracted. A middle ear effusion is present.  Nose: Mucosal edema and rhinorrhea present. Right sinus exhibits maxillary sinus tenderness. Left sinus exhibits maxillary sinus tenderness.  Mouth/Throat: Uvula is midline and mucous membranes are normal. Posterior oropharyngeal erythema present. No oropharyngeal exudate, posterior oropharyngeal edema or tonsillar abscesses.  Eyes: Conjunctivae and EOM are normal. Pupils are equal, round, and reactive to light. Right eye exhibits no chemosis and no discharge. Left eye exhibits no chemosis and no discharge. No scleral icterus.  Lids swollen  Neck: Normal range of motion. Neck supple.  Cardiovascular: Normal rate, regular rhythm, normal heart sounds and intact distal pulses.   Pulmonary/Chest: Effort normal and breath sounds normal.  Lymphadenopathy:       Head (right side): No submandibular, no tonsillar, no preauricular and no posterior auricular adenopathy present.       Head (left side): No submandibular, no tonsillar, no preauricular and no posterior auricular adenopathy present.    She has no  cervical adenopathy.       Right: No supraclavicular adenopathy present.       Left: No supraclavicular adenopathy present.  Neurological: She is alert and oriented to person, place, and time.  Skin: Skin is warm and dry. She is not diaphoretic. No erythema.  Psychiatric: She has a normal mood and affect. Her behavior  is normal.          Assessment & Plan:   Allergic dermatitis eyelid - Plan: methylPREDNISolone acetate (DEPO-MEDROL) injection 80 mg  Allergic angioedema  Meds ordered this encounter  Medications  . FLUoxetine (PROZAC) 40 MG capsule    Sig: Take 20 mg by mouth daily.  . ranitidine (ZANTAC) 150 MG tablet    Sig: Take 1 tablet (150 mg total) by mouth 2 (two) times daily.    Dispense:  60 tablet    Refill:  1  . cetirizine (ZYRTEC) 10 MG tablet    Sig: Take 1 tablet (10 mg total) by mouth daily.    Dispense:  30 tablet    Refill:  1  . methylPREDNISolone acetate (DEPO-MEDROL) injection 80 mg    Sig:   . hydrOXYzine (ATARAX/VISTARIL) 25 MG tablet    Sig: Take 0.5-1 tablets (12.5-25 mg total) by mouth every 8 (eight) hours as needed for itching.    Dispense:  30 tablet    Refill:  0     Delman Cheadle, MD MPH

## 2014-07-02 ENCOUNTER — Ambulatory Visit (INDEPENDENT_AMBULATORY_CARE_PROVIDER_SITE_OTHER): Payer: BC Managed Care – PPO | Admitting: Internal Medicine

## 2014-07-02 VITALS — BP 124/70 | HR 70 | Temp 99.0°F | Resp 16 | Ht 66.0 in | Wt 130.0 lb

## 2014-07-02 DIAGNOSIS — N920 Excessive and frequent menstruation with regular cycle: Secondary | ICD-10-CM

## 2014-07-02 DIAGNOSIS — N921 Excessive and frequent menstruation with irregular cycle: Secondary | ICD-10-CM

## 2014-07-02 DIAGNOSIS — F988 Other specified behavioral and emotional disorders with onset usually occurring in childhood and adolescence: Secondary | ICD-10-CM

## 2014-07-02 DIAGNOSIS — F411 Generalized anxiety disorder: Secondary | ICD-10-CM

## 2014-07-02 MED ORDER — NORGESTIMATE-ETH ESTRADIOL 0.25-35 MG-MCG PO TABS: 1.0000 | ORAL_TABLET | Freq: Every day | ORAL | Status: DC

## 2014-07-02 MED ORDER — CLONAZEPAM 0.5 MG PO TABS: ORAL_TABLET | ORAL | Status: DC

## 2014-07-02 MED ORDER — FLUOXETINE HCL 40 MG PO CAPS: 40.0000 mg | ORAL_CAPSULE | Freq: Every day | ORAL | Status: DC

## 2014-07-02 NOTE — Progress Notes (Signed)
Subjective:  This chart was scribed for Zoe Lin, MD by Mercy Moore, Medial Scribe. This patient was seen in room 12 and the patient's care was started at 7:05 PM.    Patient ID: Zoe Wiley, female    DOB: 1985-12-12, 28 y.o.   MRN: [id number]  Chief Complaint  Patient presents with  . Panic Attack    Pt. had a panic attack after work, had a rough day at work. Pt. was taking Klonopin but stopped taking it.     HPI HPI Comments: Zoe Wiley is a 28 y.o. female who presents to the [facility] complaining of a panic attack, increased anxiety, today at work. Patient reports an embarrassing event inducing her attack. She shares recently starting her period and bleeding so heavily today that she bleed through her pants and onto her chair at work. Patient reports that her the period is typically irregular even with her taking birth control. Bleeds before placebos. Occas back pain with her periods which she usually treats with Midol. Does not bleed heavily with every period Patient denies complications with intercourse. One partner. Monogamous.  Patient shares that she and her boyfriend are discussing marriage and children and she is concerned that she will have to discontinue use of her Prozac and other anxiety medications during pregnancy for the health of her child. Patient is reassured that her increased anxiety today was not atypical or a sign that her medication dosage is no longer effective. Over the past 6 months she has really remained in good control of her life and has even been able to discontinue Adderall feeling that the Prozac has been so effective at relieving anxiety and she now is more clearheaded. She has needed Klonopin once a week or less at half dose.   Patient Active Problem List   Diagnosis Date Noted  . ADD (attention deficit disorder) 06/25/2012  . Generalized anxiety disorder 06/25/2012   Past Medical History  Diagnosis Date  .  Anxiety    Past Surgical History  Procedure Laterality Date  . Fracture surgery     No Known Allergies Prior to Admission medications   Medication Sig Start Date End Date Taking? Authorizing Provider  amphetamine-dextroamphetamine (ADDERALL) 10 MG tablet Take 1 tablet (10 mg total) by mouth 2 (two) times daily. 30 d from signed date 01/03/14  Yes Leandrew Koyanagi, MD  amphetamine-dextroamphetamine (ADDERALL) 10 MG tablet Take 1 tablet (10 mg total) by mouth 2 (two) times daily. 60d from signed date 01/03/14  Yes Leandrew Koyanagi, MD  amphetamine-dextroamphetamine (ADDERALL) 10 MG tablet Take 1 tablet (10 mg total) by mouth 2 (two) times daily. 05/09/14  Yes Leandrew Koyanagi, MD  FLUoxetine (PROZAC) 40 MG capsule Take 20 mg by mouth daily. 01/03/14  Yes Leandrew Koyanagi, MD  norethindrone-ethinyl estradiol (JUNEL FE 1/20) 1-20 MG-MCG tablet Take 1 tablet by mouth daily. 01/03/14  Yes Leandrew Koyanagi, MD  cetirizine (ZYRTEC) 10 MG tablet Take 1 tablet (10 mg total) by mouth daily. 03/19/14   Shawnee Knapp, MD  clonazePAM (KLONOPIN) 0.5 MG tablet TAKE 1 TABLET BY MOUTH TWICE DAILY AS NEEDED    Leandrew Koyanagi, MD  hydrOXYzine (ATARAX/VISTARIL) 25 MG tablet Take 0.5-1 tablets (12.5-25 mg total) by mouth every 8 (eight) hours as needed for itching. 03/19/14   Shawnee Knapp, MD  Norethindrone Acetate-Ethinyl Estradiol (MICROGESTIN) 1.5-30 MG-MCG tablet Take 1 tablet by mouth daily. 08/02/13   Leandrew Koyanagi,  MD  ranitidine (ZANTAC) 150 MG tablet Take 1 tablet (150 mg total) by mouth 2 (two) times daily. 03/19/14   Shawnee Knapp, MD    Review of Systems  Constitutional: Negative for fever and chills.  Gastrointestinal: Negative for abdominal pain.  Genitourinary: Positive for menstrual problem.       Objective:   Physical Exam  Nursing note and vitals reviewed. Constitutional: She is oriented to person, place, and time. She appears well-developed and well-nourished. No distress.  HENT:  Head:  Normocephalic and atraumatic.  Eyes: EOM are normal.  Neck: Neck supple.  Cardiovascular: Normal rate.   Pulmonary/Chest: Effort normal. No respiratory distress.  Musculoskeletal: Normal range of motion.  Neurological: She is alert and oriented to person, place, and time.  Skin: Skin is warm and dry.  Psychiatric: She has a normal mood and affect. Her behavior is normal.    Filed Vitals:   07/02/14 1839  BP: 124/70  Pulse: 70  Temp: 99 F (37.2 C)  TempSrc: Oral  Resp: 16  Height: 5\' 6"  (1.676 m)  Weight: 130 lb (58.968 kg)  SpO2: 99%        Assessment & Plan:  Generalized anxiety disorder  Refill Prozac 40 mg  Continue Klonopin for panic attacks  ADD (attention deficit disorder)  Stay off medication for now  Menorrhagia with irregular cycle  Change birth control to [employer]  F/u 3-6 mos//f/u when desires pregnancy  I personally performed the services described in this documentation, which was scribed in my presence. The recorded information has been reviewed and is accurate.

## 2014-08-07 ENCOUNTER — Telehealth: Payer: Self-pay

## 2014-08-07 MED ORDER — AMPHETAMINE-DEXTROAMPHETAMINE 10 MG PO TABS: 10.0000 mg | ORAL_TABLET | Freq: Two times a day (BID) | ORAL | Status: DC

## 2014-08-07 NOTE — Telephone Encounter (Signed)
Pt is needing refill on clonazePAM (KLONOPIN) 0.5 MG tablet and her adderall

## 2014-08-07 NOTE — Telephone Encounter (Signed)
Meds ordered this encounter  Medications  . amphetamine-dextroamphetamine (ADDERALL) 10 MG tablet    Sig: Take 1 tablet (10 mg total) by mouth 2 (two) times daily.    Dispense:  60 tablet    Refill:  0   Was given ref on klonopin 07/02/14

## 2014-08-08 NOTE — Telephone Encounter (Signed)
Called pt, advised adderall Rx ready and she should have RFs on clonazepam from last months Rx. She agreed to contact pharmacy.

## 2014-10-19 ENCOUNTER — Telehealth: Payer: Self-pay | Admitting: Internal Medicine

## 2014-10-19 MED ORDER — AMPHETAMINE-DEXTROAMPHETAMINE 10 MG PO TABS: 10.0000 mg | ORAL_TABLET | Freq: Two times a day (BID) | ORAL | Status: DC

## 2014-10-19 NOTE — Telephone Encounter (Signed)
Patient wants to know if she needs to have an OV in order to get another refill on her Adderall.  [phone]

## 2014-10-19 NOTE — Telephone Encounter (Signed)
Per Dr. Ninfa Meeker note, she needs to follow-up with him in Mid-March.  Rx printed. Meds ordered this encounter  Medications  . amphetamine-dextroamphetamine (ADDERALL) 10 MG tablet    Sig: Take 1 tablet (10 mg total) by mouth 2 (two) times daily.    Dispense:  60 tablet    Refill:  0

## 2014-10-20 NOTE — Telephone Encounter (Signed)
LMOM that this is ready for p/u and to f/u in mid March

## 2014-11-26 ENCOUNTER — Other Ambulatory Visit: Payer: Self-pay

## 2014-11-26 ENCOUNTER — Telehealth: Payer: Self-pay | Admitting: Family Medicine

## 2014-11-26 NOTE — Telephone Encounter (Signed)
Pt requesting adderall  Refill    Best phone for pt is [phone]

## 2014-11-28 MED ORDER — AMPHETAMINE-DEXTROAMPHETAMINE 10 MG PO TABS: 10.0000 mg | ORAL_TABLET | Freq: Two times a day (BID) | ORAL | Status: DC

## 2014-11-28 NOTE — Telephone Encounter (Signed)
Dr Laney Pastor, I just pended 1 mos bc it looks like she might be due for f/up then?

## 2014-11-28 NOTE — Telephone Encounter (Signed)
Meds ordered this encounter  Medications  . amphetamine-dextroamphetamine (ADDERALL) 10 MG tablet    Sig: Take 1 tablet (10 mg total) by mouth 2 (two) times daily.    Dispense:  60 tablet    Refill:  0   F/u in march

## 2014-11-29 NOTE — Telephone Encounter (Signed)
Notified pt ready and she agreed to f/up bf runs out.

## 2014-12-09 ENCOUNTER — Other Ambulatory Visit: Payer: Self-pay | Admitting: Internal Medicine

## 2014-12-11 NOTE — Telephone Encounter (Signed)
Faxed

## 2014-12-21 ENCOUNTER — Encounter (INDEPENDENT_AMBULATORY_CARE_PROVIDER_SITE_OTHER): Payer: BLUE CROSS/BLUE SHIELD

## 2014-12-21 ENCOUNTER — Other Ambulatory Visit: Payer: Self-pay | Admitting: *Deleted

## 2014-12-21 ENCOUNTER — Ambulatory Visit (INDEPENDENT_AMBULATORY_CARE_PROVIDER_SITE_OTHER): Payer: BLUE CROSS/BLUE SHIELD | Admitting: Cardiology

## 2014-12-21 ENCOUNTER — Encounter: Payer: Self-pay | Admitting: Radiology

## 2014-12-21 ENCOUNTER — Ambulatory Visit (INDEPENDENT_AMBULATORY_CARE_PROVIDER_SITE_OTHER): Payer: BLUE CROSS/BLUE SHIELD | Admitting: Family Medicine

## 2014-12-21 ENCOUNTER — Encounter: Payer: Self-pay | Admitting: Cardiology

## 2014-12-21 ENCOUNTER — Ambulatory Visit
Admission: RE | Admit: 2014-12-21 | Discharge: 2014-12-21 | Disposition: A | Discharge status: Home / Self Care | Payer: BLUE CROSS/BLUE SHIELD | Source: Ambulatory Visit | Attending: Family Medicine | Admitting: Family Medicine

## 2014-12-21 ENCOUNTER — Encounter: Payer: Self-pay | Admitting: Family Medicine

## 2014-12-21 VITALS — BP 102/60 | HR 61 | Temp 98.8°F | Resp 16 | Ht 66.5 in | Wt 127.0 lb

## 2014-12-21 VITALS — BP 104/50 | HR 63 | Ht 66.0 in | Wt 127.2 lb

## 2014-12-21 DIAGNOSIS — E041 Nontoxic single thyroid nodule: Secondary | ICD-10-CM

## 2014-12-21 DIAGNOSIS — I499 Cardiac arrhythmia, unspecified: Secondary | ICD-10-CM

## 2014-12-21 DIAGNOSIS — R55 Syncope and collapse: Secondary | ICD-10-CM

## 2014-12-21 DIAGNOSIS — I491 Atrial premature depolarization: Secondary | ICD-10-CM | POA: Insufficient documentation

## 2014-12-21 DIAGNOSIS — I4510 Unspecified right bundle-branch block: Secondary | ICD-10-CM

## 2014-12-21 DIAGNOSIS — R221 Localized swelling, mass and lump, neck: Secondary | ICD-10-CM | POA: Diagnosis not present

## 2014-12-21 DIAGNOSIS — R9431 Abnormal electrocardiogram [ECG] [EKG]: Secondary | ICD-10-CM | POA: Diagnosis not present

## 2014-12-21 DIAGNOSIS — N920 Excessive and frequent menstruation with regular cycle: Secondary | ICD-10-CM

## 2014-12-21 DIAGNOSIS — I498 Other specified cardiac arrhythmias: Secondary | ICD-10-CM

## 2014-12-21 DIAGNOSIS — F411 Generalized anxiety disorder: Secondary | ICD-10-CM

## 2014-12-21 HISTORY — DX: Unspecified right bundle-branch block: I45.10

## 2014-12-21 HISTORY — DX: Atrial premature depolarization: I49.1

## 2014-12-21 HISTORY — DX: Syncope and collapse: R55

## 2014-12-21 LAB — POCT UA - MICROSCOPIC ONLY
Casts, Ur, LPF, POC: NEGATIVE
Crystals, Ur, HPF, POC: NEGATIVE
MUCUS UA: POSITIVE
RBC, URINE, MICROSCOPIC: NEGATIVE
Yeast, UA: NEGATIVE

## 2014-12-21 LAB — COMPREHENSIVE METABOLIC PANEL
ALT: 12 U/L (ref 0–35)
AST: 13 U/L (ref 0–37)
Albumin: 4.2 g/dL (ref 3.5–5.2)
Alkaline Phosphatase: 34 U/L — ABNORMAL LOW (ref 39–117)
BILIRUBIN TOTAL: 0.5 mg/dL (ref 0.2–1.2)
BUN: 10 mg/dL (ref 6–23)
CALCIUM: 8.9 mg/dL (ref 8.4–10.5)
CHLORIDE: 106 meq/L (ref 96–112)
CO2: 25 meq/L (ref 19–32)
Creat: 0.68 mg/dL (ref 0.50–1.10)
GLUCOSE: 83 mg/dL (ref 70–99)
POTASSIUM: 4.3 meq/L (ref 3.5–5.3)
SODIUM: 137 meq/L (ref 135–145)
TOTAL PROTEIN: 6.6 g/dL (ref 6.0–8.3)

## 2014-12-21 LAB — POCT URINALYSIS DIPSTICK
Bilirubin, UA: NEGATIVE
Blood, UA: NEGATIVE
Glucose, UA: NEGATIVE
Ketones, UA: NEGATIVE
LEUKOCYTES UA: NEGATIVE
NITRITE UA: NEGATIVE
Protein, UA: NEGATIVE
Spec Grav, UA: 1.02
UROBILINOGEN UA: 0.2
pH, UA: 5.5

## 2014-12-21 LAB — POCT CBC
Granulocyte percent: 54.6 %G (ref 37–80)
HCT, POC: 40.9 % (ref 37.7–47.9)
Hemoglobin: 13.6 g/dL (ref 12.2–16.2)
LYMPH, POC: 1.5 (ref 0.6–3.4)
MCH, POC: 30.5 pg (ref 27–31.2)
MCHC: 33.1 g/dL (ref 31.8–35.4)
MCV: 92.2 fL (ref 80–97)
MID (CBC): 0.3 (ref 0–0.9)
MPV: 8.1 fL (ref 0–99.8)
PLATELET COUNT, POC: 168 10*3/uL (ref 142–424)
POC Granulocyte: 2.1 (ref 2–6.9)
POC LYMPH %: 38.4 % (ref 10–50)
POC MID %: 7 % (ref 0–12)
RBC: 4.44 M/uL (ref 4.04–5.48)
RDW, POC: 12.5 %
WBC: 3.8 10*3/uL — AB (ref 4.6–10.2)

## 2014-12-21 LAB — POCT URINE PREGNANCY: PREG TEST UR: NEGATIVE

## 2014-12-21 LAB — TSH: TSH: 1.792 u[IU]/mL (ref 0.350–4.500)

## 2014-12-21 LAB — T4, FREE: FREE T4: 0.95 ng/dL (ref 0.80–1.80)

## 2014-12-21 LAB — HCG, SERUM, QUALITATIVE: Preg, Serum: NEGATIVE

## 2014-12-21 LAB — POCT SEDIMENTATION RATE: POCT SED RATE: 4 mm/h (ref 0–22)

## 2014-12-21 LAB — T3, FREE: T3 FREE: 3.1 pg/mL (ref 2.3–4.2)

## 2014-12-21 NOTE — Patient Instructions (Signed)
Go to [facility] on [address] talked with Dr. Radford Pax who has ordered a heart monitor for you to wear over the weekend and then you will be scheduled to come in for a cardiology visit at some point next week.  Go to [facility] on [address] by 1:25 for the thyroid ultasound  Near-Syncope Near-syncope (commonly known as near fainting) is sudden weakness, dizziness, or feeling like you might pass out. During an episode of near-syncope, you may also develop pale skin, have tunnel vision, or feel sick to your stomach (nauseous). Near-syncope may occur when getting up after sitting or while standing for a long time. It is caused by a sudden decrease in blood flow to the brain. This decrease can result from various causes or triggers, most of which are not serious. However, because near-syncope can sometimes be a sign of something serious, a medical evaluation is required. The specific cause is often not determined. HOME CARE INSTRUCTIONS  Monitor your condition for any changes. The following actions may help to alleviate any discomfort you are experiencing:  Have someone stay with you until you feel stable.  Lie down right away and prop your feet up if you start feeling like you might faint. Breathe deeply and steadily. Wait until all the symptoms have passed. Most of these episodes last only a few minutes. You may feel tired for several hours.   Drink enough fluids to keep your urine clear or pale yellow.   If you are taking blood pressure or heart medicine, get up slowly when seated or lying down. Take several minutes to sit and then stand. This can reduce dizziness.  Follow up with your health care provider as directed. SEEK IMMEDIATE MEDICAL CARE IF:   You have a severe headache.   You have unusual pain in the chest, abdomen, or back.   You are bleeding from the mouth or rectum, or you have black or tarry stool.   You have an irregular or  very fast heartbeat.   You have repeated fainting or have seizure-like jerking during an episode.   You faint when sitting or lying down.   You have confusion.   You have difficulty walking.   You have severe weakness.   You have vision problems.  MAKE SURE YOU:   Understand these instructions.  Will watch your condition.  Will get help right away if you are not doing well or get worse. Document Released: 09/28/2005 Document Revised: 10/03/2013 Document Reviewed: 03/03/2013 [facility] Patient Information 2015 [place], [place]. This information is not intended to replace advice given to you by your health care provider. Make sure you discuss any questions you have with your health care provider. Syncope Syncope is a medical term for fainting or passing out. This means you lose consciousness and drop to the ground. People are generally unconscious for less than 5 minutes. You may have some muscle twitches for up to 15 seconds before waking up and returning to normal. Syncope occurs more often in older adults, but it can happen to anyone. While most causes of syncope are not dangerous, syncope can be a sign of a serious medical problem. It is important to seek medical care.  CAUSES  Syncope is caused by a sudden drop in blood flow to the brain. The specific cause is often not determined. Factors that can bring on syncope include:  Taking medicines that lower blood pressure.  Sudden changes in posture, such as  standing up quickly.  Taking more medicine than prescribed.  Standing in one place for too long.  Seizure disorders.  Dehydration and excessive exposure to heat.  Low blood sugar (hypoglycemia).  Straining to have a bowel movement.  Heart disease, irregular heartbeat, or other circulatory problems.  Fear, emotional distress, seeing blood, or severe pain. SYMPTOMS  Right before fainting, you may:  Feel dizzy or light-headed.  Feel nauseous.  See all white or  all black in your field of vision.  Have cold, clammy skin. DIAGNOSIS  Your health care provider will ask about your symptoms, perform a physical exam, and perform an electrocardiogram (ECG) to record the electrical activity of your heart. Your health care provider may also perform other heart or blood tests to determine the cause of your syncope which may include:  Transthoracic echocardiogram (TTE). During echocardiography, sound waves are used to evaluate how blood flows through your heart.  Transesophageal echocardiogram (TEE).  Cardiac monitoring. This allows your health care provider to monitor your heart rate and rhythm in real time.  Holter monitor. This is a portable device that records your heartbeat and can help diagnose heart arrhythmias. It allows your health care provider to track your heart activity for several days, if needed.  Stress tests by exercise or by giving medicine that makes the heart beat faster. TREATMENT  In most cases, no treatment is needed. Depending on the cause of your syncope, your health care provider may recommend changing or stopping some of your medicines. HOME CARE INSTRUCTIONS  Have someone stay with you until you feel stable.  Do not drive, use machinery, or play sports until your health care provider says it is okay.  Keep all follow-up appointments as directed by your health care provider.  Lie down right away if you start feeling like you might faint. Breathe deeply and steadily. Wait until all the symptoms have passed.  Drink enough fluids to keep your urine clear or pale yellow.  If you are taking blood pressure or heart medicine, get up slowly and take several minutes to sit and then stand. This can reduce dizziness. SEEK IMMEDIATE MEDICAL CARE IF:   You have a severe headache.  You have unusual pain in the chest, abdomen, or back.  You are bleeding from your mouth or rectum, or you have black or tarry stool.  You have an irregular  or very fast heartbeat.  You have pain with breathing.  You have repeated fainting or seizure-like jerking during an episode.  You faint when sitting or lying down.  You have confusion.  You have trouble walking.  You have severe weakness.  You have vision problems. If you fainted, call your local emergency services (911 in [place] Do not drive yourself to the hospital.  MAKE SURE YOU:  Understand these instructions.  Will watch your condition.  Will get help right away if you are not doing well or get worse. Document Released: 09/28/2005 Document Revised: 10/03/2013 Document Reviewed: 11/27/2011 [facility] Patient Information 2015 [place], [place]. This information is not intended to replace advice given to you by your health care provider. Make sure you discuss any questions you have with your health care provider. Vasovagal Syncope, Adult Syncope, commonly known as fainting, is a temporary loss of consciousness. It occurs when the blood flow to the brain is reduced. Vasovagal syncope (also called neurocardiogenic syncope) is a fainting spell in which the blood flow to the brain is reduced because of a sudden drop in heart rate  and blood pressure. Vasovagal syncope occurs when the brain and the cardiovascular system (blood vessels) do not adequately communicate and respond to each other. This is the most common cause of fainting. It often occurs in response to fear or some other type of emotional or physical stress. The body has a reaction in which the heart starts beating too slowly or the blood vessels expand, reducing blood pressure. This type of fainting spell is generally considered harmless. However, injuries can occur if a person takes a sudden fall during a fainting spell.  CAUSES  Vasovagal syncope occurs when a person's blood pressure and heart rate decrease suddenly, usually in response to a trigger. Many things and situations can trigger an episode. Some of these include:    Pain.   Fear.   The sight of blood or medical procedures, such as blood being drawn from a vein.   Common activities, such as coughing, swallowing, stretching, or going to the bathroom.   Emotional stress.   Prolonged standing, especially in a warm environment.   Lack of sleep or rest.   Prolonged lack of food.   Prolonged lack of fluids.   Recent illness.  The use of certain drugs that affect blood pressure, such as cocaine, alcohol, marijuana, inhalants, and opiates.  SYMPTOMS  Before the fainting episode, you may:   Feel dizzy or light headed.   Become pale.  Sense that you are going to faint.   Feel like the room is spinning.   Have tunnel vision, only seeing directly in front of you.   Feel sick to your stomach (nauseous).   See spots or slowly lose vision.   Hear ringing in your ears.   Have a headache.   Feel warm and sweaty.   Feel a sensation of pins and needles. During the fainting spell, you will generally be unconscious for no longer than a couple minutes before waking up and returning to normal. If you get up too quickly before your body can recover, you may faint again. Some twitching or jerky movements may occur during the fainting spell.  DIAGNOSIS  Your caregiver will ask about your symptoms, take a medical history, and perform a physical exam. Various tests may be done to rule out other causes of fainting. These may include blood tests and tests to check the heart, such as electrocardiography, echocardiography, and possibly an electrophysiology study. When other causes have been ruled out, a test may be done to check the body's response to changes in position (tilt table test). TREATMENT  Most cases of vasovagal syncope do not require treatment. Your caregiver may recommend ways to avoid fainting triggers and may provide home strategies for preventing fainting. If you must be exposed to a possible trigger, you can drink  additional fluids to help reduce your chances of having an episode of vasovagal syncope. If you have warning signs of an oncoming episode, you can respond by positioning yourself favorably (lying down). If your fainting spells continue, you may be given medicines to prevent fainting. Some medicines may help make you more resistant to repeated episodes of vasovagal syncope. Special exercises or compression stockings may be recommended. In rare cases, the surgical placement of a pacemaker is considered. HOME CARE INSTRUCTIONS   Learn to identify the warning signs of vasovagal syncope.   Sit or lie down at the first warning sign of a fainting spell. If sitting, put your head down between your legs. If you lie down, swing your legs up in  the air to increase blood flow to the brain.   Avoid hot tubs and saunas.  Avoid prolonged standing.  Drink enough fluids to keep your urine clear or pale yellow. Avoid caffeine.  Increase salt in your diet as directed by your caregiver.   If you have to stand for a long time, perform movements such as:   Crossing your legs.   Flexing and stretching your leg muscles.   Squatting.   Moving your legs.   Bending over.   Only take over-the-counter or prescription medicines as directed by your caregiver. Do not suddenly stop any medicines without asking your caregiver first. SEEK MEDICAL CARE IF:   Your fainting spells continue or happen more frequently in spite of treatment.   You lose consciousness for more than a couple minutes.  You have fainting spells during or after exercising or after being startled.   You have new symptoms that occur with the fainting spells, such as:   Shortness of breath.  Chest pain.   Irregular heartbeat.   You have episodes of twitching or jerky movements that last longer than a few seconds.  You have episodes of twitching or jerky movements without obvious fainting. SEEK IMMEDIATE MEDICAL CARE IF:    You have injuries or bleeding after a fainting spell.   You have episodes of twitching or jerky movements that last longer than 5 minutes.   You have more than one spell of twitching or jerky movements before returning to consciousness after fainting. MAKE SURE YOU:   Understand these instructions.  Will watch your condition.  Will get help right away if you are not doing well or get worse. Document Released: 09/14/2012 Document Reviewed: 09/14/2012 [facility] Patient Information 2015 [place]. This information is not intended to replace advice given to you by your health care provider. Make sure you discuss any questions you have with your health care provider.

## 2014-12-21 NOTE — Patient Instructions (Signed)
Your physician recommends that you continue on your current medications as directed. Please refer to the Current Medication list given to you today.  Your physician has requested that you have an exercise tolerance test. DX: SYNCOPE For further information please visit [email or web address]. Please also follow instruction sheet, as given.   Your physician has requested that you have a cardiac MRI. DX: SYNCOPE. Cardiac MRI uses a computer to create images of your heart as its beating, producing both still and moving pictures of your heart and major blood vessels. For further information please visit [email or web address] Please follow the instruction sheet given to you today for more information.  Your physician recommends that you schedule a follow-up appointment in: 4 WEEKS

## 2014-12-21 NOTE — Progress Notes (Addendum)
Cardiology Office Note   Date:  12/21/2014   ID:  Zoe Wiley, DOB [birth year], MRN [id number]  PCP:  No PCP Per Patient  Cardiologist:   Sueanne Margarita, MD   Chief Complaint  Patient presents with  . Loss of Consciousness      History of Present Illness: Zoe Wiley is a 29 y.o. female who presents for evaluation of syncope.  She has a history of syncope a year ago and then twice in the past week.  A year ago she all of a sudden felt dizzy and got on the floor and waited for it to pass but she did not pass out completely.  She has had 2 more in the past month  The first one occurred after she got out of bed to get a drink of water and then got very dizzy and lightheaded and felt like she was going to faint if she did not sit down.  It resolved after a minute of sitting.  She gets a funny feeling in her chest but dose not think it was palpitations.  She also gets diaphoretic with the episode.  The most recent episode occurred while fixing a bowl of cereal in the kitchen and suddenly felt very dizzy and broke out in a sweat.  She leaned against the kitchen counter and then walked to the bedroom and collapsed.  Her husband witnessed the event and said she was soaking wet with sweat.  She lost consciousness with this event.  She denies any chest pain or SOB. She denies any OTC medications.  She denies any family history of sudden cardiac death or syncope.  She does have a family history of atrial fibrillation but no family history of CAD or DCM.  She is oriented completely when she wakes up with no incontinence or tongue biting.      Past Medical History  Diagnosis Date  . Anxiety   . Syncope 12/21/2014  . RBBB 12/21/2014  . 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 Fracture surgery      bilateral wrist     Current Outpatient Prescriptions  Medication Sig Dispense Refill  . amphetamine-dextroamphetamine (ADDERALL) 10 MG tablet Take 1  tablet (10 mg total) by mouth 2 (two) times daily. 60 tablet 0  . clonazePAM (KLONOPIN) 0.5 MG tablet TAKE 1/2-1 TABLET BY MOUTH TWICE DAILY AS NEEDED 30 tablet 0  . FLUoxetine (PROZAC) 40 MG capsule Take 1 capsule (40 mg total) by mouth daily. 30 capsule 11   No current facility-administered medications for this visit.    Allergies:   Review of patient's allergies indicates no known allergies.    Social History:  The patient  reports that she quit smoking about 17 months ago. Her smoking use included Cigarettes. She has a 3.5 pack-year smoking history. She does not have any smokeless tobacco history on file. She reports that she drinks about 0.6 oz of alcohol per week. She reports that she does not use illicit drugs.   Family History:  The patient's family history includes Arrhythmia in her father.    ROS:  Please see the history of present illness.   Otherwise, review of systems are positive for none.   All other systems are reviewed and negative.    PHYSICAL EXAM: VS:  BP 104/50 mmHg  Pulse 63  Ht 5\' 6"  (1.676 m)  Wt 127 lb 3.2 oz (57.698 kg)  BMI 20.54 kg/m2  SpO2 98%  LMP 11/29/2014 , BMI Body mass index is 20.54 kg/(m^2). GEN: Well nourished, well developed, in no acute distress HEENT: normal Neck: no JVD, carotid bruits, or masses Cardiac: RRR; no murmurs, rubs, or gallops,no edema  Respiratory:  clear to auscultation bilaterally, normal work of breathing GI: soft, nontender, nondistended, + BS MS: no deformity or atrophy Skin: warm and dry, no rash Neuro:  Strength and sensation are intact Psych: euthymic mood, full affect   EKG:  EKG is not ordered today.    Recent Labs: 12/21/2014: ALT 12; BUN 10; Creatinine 0.68; Hemoglobin 13.6; Potassium 4.3; Sodium 137; TSH 1.792    Lipid Panel    Component Value Date/Time   CHOL 124 05/09/2012 1553   TRIG 69 05/09/2012 1553   HDL 47 05/09/2012 1553   CHOLHDL 2.6 05/09/2012 1553   VLDL 14 05/09/2012 1553   LDLCALC 63  05/09/2012 1553      Wt Readings from Last 3 Encounters:  12/21/14 127 lb 3.2 oz (57.698 kg)  12/21/14 127 lb (57.607 kg)  07/02/14 130 lb (58.968 kg)      Other studies Reviewed: Additional studies/ records that were reviewed today include: EKG. Review of the above records demonstrates: ectopic atrial rhythm with intermittent RBBB   ASSESSMENT AND PLAN:  1.  Syncope of unclear etiology.  BP is very soft and most episodes occurred while standing so most likely this is due to orthostatic hypotension.  She also has an intermittent RBBB on EKG and ectopic atrial rhythm so need to consider arrhythmia.  I will get a Cardiac MRI to assess for structural heart disease.  I will also get a lifewatch monitor to assess for arrhythmias.  I have encouraged her to increase her fluid intake and avoid caffeine (she drinks a lot of diet coke).  I have encouraged her to liberalize her sodium intake and consider drinking some gatorade.  I have advised her to avoid driving until I see her back.   2.  Intermittent RBBB - I will get an ETT  3.  Ectopic atrial rhythm   Current medicines are reviewed at length with the patient today.  The patient does not have concerns regarding medicines.  The following changes have been made:  no change  Labs/ tests ordered today include: ETT, cardiac MRI, event monitor   Orders Placed This Encounter  Procedures  . MR Card Morphology Wo/W Cm  . Exercise tolerance test     Disposition:   FU with me in 4 weeks   Signed, Sueanne Margarita, MD  12/21/2014 4:32 PM    [facility] [address], [place], [place]  [address] Phone: [phone]; Fax: [phone]

## 2014-12-21 NOTE — Progress Notes (Signed)
Patient ID: Zoe Wiley, female   DOB: 1986-04-04, 29 y.o.   MRN: [id number] Lifewatch 30 day monitor applied. EOS 01-20-15

## 2014-12-25 ENCOUNTER — Encounter: Payer: Self-pay | Admitting: Cardiology

## 2014-12-26 ENCOUNTER — Institutional Professional Consult (permissible substitution): Payer: Self-pay | Admitting: Cardiology

## 2015-01-01 ENCOUNTER — Encounter: Payer: Self-pay | Admitting: Cardiology

## 2015-01-08 ENCOUNTER — Ambulatory Visit: Payer: BLUE CROSS/BLUE SHIELD

## 2015-01-09 NOTE — Progress Notes (Signed)
Subjective:    Patient ID: Zoe Wiley, female    DOB: 1986-06-02, 29 y.o.   MRN: [id number] This chart was scribed for Delman Cheadle, MD by Zola Button, Medical Scribe. This patient was seen in Room 25 and the patient's care was started at 8:35 AM.   Chief Complaint  Patient presents with  . estab care  . Blacking out    first episode 1 yr ago, 2 episodes within the last 1-2 months    HPI HPI Comments: Zoe Wiley is a 29 y.o. female with a hx of ADD and generalized anxiety disorder who presents to the [facility] complaining of syncope.   She was last seen in office 6 months ago for complaints of metromenorrhagia at that point. Her BCP were changed to [place] for improved control, but she was thinking about stopping them to conceive. At that point, she weaned herself off of Adderall but was still on Prozac 40 mg and Klonopin prn; However, she planned to wean herself off of Adderall and Prozac for conception. Her last labs were 2 years ago. 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 Toney Rakes for colposcopy.  Syncope: Patient states she has had 3 episodes of syncope in total; her first episode was about a year ago, but she has had 2 more episodes the past month with the most recent episode 2 nights ago. She believes each episode lasted about 1-2 minutes. During her most recent episode, patient states she was making a bowl of cereal while standing when she felt something wasn't right. She describes it as if "someone put a burlap sack over her" because it happened quickly. She ended up falling down in the bathroom, and her fiance was able to place her onto a bed after she lost consciousness, but when she became conscious 1-2 minutes later, she woke up soaking wet with diaphoresis. During her first episode 1 year ago, she was getting a drink in the kitchen right before going to bed. She thought she had bent over or stood up too quickly. She did lay  down on the floor before losing consciousness, so she did not fall. Patient notes that she has had some occasional palpitations for the past year. She has had some excessive menses, but she thought it was due to stopping her BCP 3 months ago. She notes that one time, she did bleed through onto the chair - she states that her menses had not been as severe before. Patient has been taking pregnancy tests 1-2 times per month, with all negative results so far. She denies lightheaded aside from the syncopal episodes, dizziness, chest pain, chest pressure, SOB, wheezing, cough, urinary symptoms and bowel symptoms. She also denies taking OTC medications, supplements, and FMHx of similar symptoms. Patient has never had to call 911 for her symptoms. She notes that she drinks about 1-2 bottles of water a day, but prefers drinking juice and soda. She drinks diet Pepsi daily. Patient also notes having some stress due to new job.  Past Medical History  Diagnosis Date  . Anxiety   . Syncope 12/21/2014  . RBBB 12/21/2014  . 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 amphetamine-dextroamphetamine (ADDERALL) 10 MG tablet Take 1 tablet (10 mg total) by mouth 2 (two) times daily. 60 tablet 0  . clonazePAM (KLONOPIN) 0.5 MG tablet TAKE 1/2-1 TABLET  BY MOUTH TWICE DAILY AS NEEDED 30 tablet 0  . FLUoxetine (PROZAC) 40 MG capsule Take 1 capsule (40 mg total) by mouth daily. 30 capsule 11   No current facility-administered medications on file prior to visit.   No Known Allergies   Review of Systems  Constitutional: Positive for diaphoresis.  Respiratory: Negative for cough, shortness of breath and wheezing.   Cardiovascular: Positive for palpitations. Negative for chest pain.  Gastrointestinal: Negative for diarrhea and constipation.  Genitourinary: Positive for menstrual problem. Negative for dysuria, urgency, hematuria, decreased urine  volume, enuresis and difficulty urinating.  Neurological: Positive for syncope and light-headedness. Negative for dizziness.       Objective:  Physical Exam  Constitutional: She is oriented to person, place, and time. She appears well-developed and well-nourished. No distress.  HENT:  Head: Normocephalic and atraumatic.  Mouth/Throat: Oropharynx is clear and moist. No oropharyngeal exudate.  Eyes: Pupils are equal, round, and reactive to light.  Neck: Neck supple. Thyromegaly present.  Left lobe of thyroid is enlarged. Right lobe of thyroid is normal.   Cardiovascular: Normal rate, regular rhythm, S2 normal and normal heart sounds.   Soft S1, normal S2. Question of 1/6 systolic murmur right upper sternal border, but uncertain.   Pulmonary/Chest: Effort normal.  Musculoskeletal: She exhibits no edema.  Lymphadenopathy:    She has no cervical adenopathy.  Neurological: She is alert and oriented to person, place, and time. No cranial nerve deficit.  Skin: Skin is warm and dry. No rash noted.  Psychiatric: She has a normal mood and affect. Her behavior is normal.  Nursing note and vitals reviewed.  BP 102/60 mmHg  Pulse 61  Temp(Src) 98.8 F (37.1 C) (Oral)  Resp 16  Ht 5' 6.5" (1.689 m)  Wt 127 lb (57.607 kg)  BMI 20.19 kg/m2  SpO2 98%  LMP 11/29/2014   Orthostatics negative.  Results for orders placed or performed in visit on 12/21/14  Comprehensive metabolic panel  Result Value Ref Range   Sodium 137 135 - 145 mEq/L   Potassium 4.3 3.5 - 5.3 mEq/L   Chloride 106 96 - 112 mEq/L   CO2 25 19 - 32 mEq/L   Glucose, Bld 83 70 - 99 mg/dL   BUN 10 6 - 23 mg/dL   Creat 0.68 0.50 - 1.10 mg/dL   Total Bilirubin 0.5 0.2 - 1.2 mg/dL   Alkaline Phosphatase 34 (L) 39 - 117 U/L   AST 13 0 - 37 U/L   ALT 12 0 - 35 U/L   Total Protein 6.6 6.0 - 8.3 g/dL   Albumin 4.2 3.5 - 5.2 g/dL   Calcium 8.9 8.4 - 10.5 mg/dL  TSH  Result Value Ref Range   TSH 1.792 0.350 - 4.500 uIU/mL  T3,  Free  Result Value Ref Range   T3, Free 3.1 2.3 - 4.2 pg/mL  T4, Free  Result Value Ref Range   Free T4 0.95 0.80 - 1.80 ng/dL  hCG, serum, qualitative  Result Value Ref Range   Preg, Serum NEG   POCT CBC  Result Value Ref Range   WBC 3.8 (A) 4.6 - 10.2 K/uL   Lymph, poc 1.5 0.6 - 3.4   POC LYMPH PERCENT 38.4 10 - 50 %L   MID (cbc) 0.3 0 - 0.9   POC MID % 7.0 0 - 12 %M   POC Granulocyte 2.1 2 - 6.9   Granulocyte percent 54.6 37 - 80 %G   RBC 4.44 4.04 -  5.48 M/uL   Hemoglobin 13.6 12.2 - 16.2 g/dL   HCT, POC 40.9 37.7 - 47.9 %   MCV 92.2 80 - 97 fL   MCH, POC 30.5 27 - 31.2 pg   MCHC 33.1 31.8 - 35.4 g/dL   RDW, POC 12.5 %   Platelet Count, POC 168 142 - 424 K/uL   MPV 8.1 0 - 99.8 fL  POCT SEDIMENTATION RATE  Result Value Ref Range   POCT SED RATE 4 0 - 22 mm/hr  POCT UA - Microscopic Only  Result Value Ref Range   WBC, Ur, HPF, POC 1-2    RBC, urine, microscopic neg    Bacteria, U Microscopic 4+    Mucus, UA pos    Epithelial cells, urine per micros 10-12    Crystals, Ur, HPF, POC neg    Casts, Ur, LPF, POC neg    Yeast, UA neg   POCT urinalysis dipstick  Result Value Ref Range   Color, UA yellow    Clarity, UA turbid    Glucose, UA neg    Bilirubin, UA neg    Ketones, UA neg    Spec Grav, UA 1.020    Blood, UA neg    pH, UA 5.5    Protein, UA neg    Urobilinogen, UA 0.2    Nitrite, UA neg    Leukocytes, UA Negative   POCT urine pregnancy  Result Value Ref Range   Preg Test, Ur Negative     EKG: abnormal  With flipped p waves in lead 2, 3 and avr    Assessment & Plan:  Spoke to Dr. Radford Pax at [facility] about the case. They will have her come into the office today to have a holter monitor placed. From the abnormal flipped p waves They thought it could be an atrial arrhythmia which could be related to her syncopal episodes.  Syncope and collapse - Plan: EKG 12-Lead, POCT CBC, POCT SEDIMENTATION RATE, POCT UA - Microscopic Only, POCT urinalysis  dipstick, POCT urine pregnancy, Comprehensive metabolic panel, TSH, T3, Free, T4, Free, hCG, serum, qualitative, Ambulatory referral to Cardiology, Ambulatory referral to Neurology  Syncope, unspecified syncope type  Menorrhagia with regular cycle  Anxiety state  Mass of thyroid region - Plan: TSH, T3, Free, T4, Free, US Soft Tissue Head/Neck - arranged for today as pt very anxious  Abnormal finding on EKG  Atrial arrhythmia  Benign thyroid cyst    I personally performed the services described in this documentation, which was scribed in my presence. The recorded information has been reviewed and considered, and addended by me as needed.  Delman Cheadle, MD MPH

## 2015-01-10 ENCOUNTER — Ambulatory Visit (HOSPITAL_COMMUNITY)
Admission: RE | Admit: 2015-01-10 | Discharge: 2015-01-10 | Disposition: A | Discharge status: Home / Self Care | Payer: BLUE CROSS/BLUE SHIELD | Source: Ambulatory Visit | Attending: Cardiology | Admitting: Cardiology

## 2015-01-10 DIAGNOSIS — R55 Syncope and collapse: Secondary | ICD-10-CM | POA: Diagnosis present

## 2015-01-10 DIAGNOSIS — I4510 Unspecified right bundle-branch block: Secondary | ICD-10-CM | POA: Diagnosis not present

## 2015-01-10 DIAGNOSIS — I491 Atrial premature depolarization: Secondary | ICD-10-CM

## 2015-01-10 DIAGNOSIS — I498 Other specified cardiac arrhythmias: Secondary | ICD-10-CM | POA: Insufficient documentation

## 2015-01-10 MED ORDER — GADOBENATE DIMEGLUMINE 529 MG/ML IV SOLN
20.0000 mL | Freq: Once | INTRAVENOUS | Status: AC | PRN
  Administered 2015-01-10: 20 mL via INTRAVENOUS

## 2015-01-14 ENCOUNTER — Telehealth: Payer: Self-pay

## 2015-01-14 NOTE — Telephone Encounter (Signed)
Patient requesting refill on:  amphetamine-dextroamphetamine (ADDERALL) 10 MG tablet   [phone]

## 2015-01-15 NOTE — Telephone Encounter (Signed)
Patient called again about her Adderrall refill.   [phone]

## 2015-01-16 MED ORDER — AMPHETAMINE-DEXTROAMPHETAMINE 10 MG PO TABS: 10.0000 mg | ORAL_TABLET | Freq: Two times a day (BID) | ORAL | Status: DC

## 2015-01-16 NOTE — Telephone Encounter (Signed)
Pt states she was in and out of the hospital in March and was unable to get in. She said she won't be able to come in for a few more weeks. She has been out of rx going on 2 weeks.

## 2015-01-16 NOTE — Telephone Encounter (Signed)
lmom that rx ready for p/u.

## 2015-01-17 ENCOUNTER — Telehealth (HOSPITAL_COMMUNITY): Payer: Self-pay

## 2015-01-17 NOTE — Telephone Encounter (Signed)
Encounter complete. 

## 2015-01-21 ENCOUNTER — Encounter: Payer: Self-pay | Admitting: Family Medicine

## 2015-01-22 ENCOUNTER — Ambulatory Visit (HOSPITAL_COMMUNITY)
Admission: RE | Admit: 2015-01-22 | Discharge: 2015-01-22 | Disposition: A | Discharge status: Home / Self Care | Payer: BLUE CROSS/BLUE SHIELD | Source: Ambulatory Visit | Attending: Cardiology | Admitting: Cardiology

## 2015-01-22 DIAGNOSIS — I4510 Unspecified right bundle-branch block: Secondary | ICD-10-CM

## 2015-01-22 DIAGNOSIS — R55 Syncope and collapse: Secondary | ICD-10-CM | POA: Diagnosis not present

## 2015-01-22 DIAGNOSIS — R079 Chest pain, unspecified: Secondary | ICD-10-CM | POA: Insufficient documentation

## 2015-01-22 DIAGNOSIS — I498 Other specified cardiac arrhythmias: Secondary | ICD-10-CM | POA: Diagnosis not present

## 2015-01-22 DIAGNOSIS — I491 Atrial premature depolarization: Secondary | ICD-10-CM

## 2015-01-23 NOTE — Procedures (Signed)
Exercise Treadmill Test   Test  Exercise Tolerance Test Ordering MD: Fransico Him, MD    Unique Test No: 1  Treadmill:  1  Indication for ETT: chest pain - rule out ischemia  Contraindication to ETT: No   Stress Modality: exercise - treadmill  Cardiac Imaging Performed: non   Protocol: standard Bruce - maximal  Max BP:  164/115  Max MPHR (bpm):  192 85% MPR (bpm):  163  MPHR obtained (bpm):  166 % MPHR obtained:  86  Reached 85% MPHR (min:sec):  4:01 Total Exercise Time (min-sec):  9:44  Workload in METS:  11.2 Borg Scale: 15  Reason ETT Terminated:  exaggerated hypertensive response    ST Segment Analysis At Rest: normal ST segments - no evidence of significant ST depression With Exercise: no evidence of significant ST depression  Other Information Arrhythmia:  No Angina during ETT:  absent (0) Quality of ETT:  diagnostic  ETT Interpretation:  normal - no evidence of ischemia by ST analysis  Comments: No ischemic EKG changes with exercise Good exercise tolerance Diastolic hypertensive response - up to 115 No chest pain  Pixie Casino, MD, [facility] Attending Cardiologist [place]

## 2015-01-25 ENCOUNTER — Telehealth: Payer: Self-pay | Admitting: Cardiology

## 2015-01-25 ENCOUNTER — Ambulatory Visit: Payer: BLUE CROSS/BLUE SHIELD | Admitting: Cardiology

## 2015-01-25 NOTE — Telephone Encounter (Signed)
Please let patient know that heart monitor was normal

## 2015-01-25 NOTE — Telephone Encounter (Signed)
Left message to call back  

## 2015-01-28 NOTE — Telephone Encounter (Signed)
Informed patient of results and verbal understanding expressed.  

## 2015-01-28 NOTE — Telephone Encounter (Signed)
Left message to call back  

## 2015-01-29 ENCOUNTER — Ambulatory Visit (INDEPENDENT_AMBULATORY_CARE_PROVIDER_SITE_OTHER): Payer: BLUE CROSS/BLUE SHIELD | Admitting: Physician Assistant

## 2015-01-29 VITALS — BP 116/64 | HR 69 | Temp 98.2°F | Resp 18 | Ht 66.0 in | Wt 129.0 lb

## 2015-01-29 DIAGNOSIS — F411 Generalized anxiety disorder: Secondary | ICD-10-CM

## 2015-01-29 DIAGNOSIS — Z3200 Encounter for pregnancy test, result unknown: Secondary | ICD-10-CM

## 2015-01-29 DIAGNOSIS — F988 Other specified behavioral and emotional disorders with onset usually occurring in childhood and adolescence: Secondary | ICD-10-CM | POA: Insufficient documentation

## 2015-01-29 DIAGNOSIS — F909 Attention-deficit hyperactivity disorder, unspecified type: Secondary | ICD-10-CM

## 2015-01-29 HISTORY — DX: Other specified behavioral and emotional disorders with onset usually occurring in childhood and adolescence: F98.8

## 2015-01-29 LAB — POCT URINE PREGNANCY: Preg Test, Ur: NEGATIVE

## 2015-01-29 MED ORDER — CLONAZEPAM 0.5 MG PO TABS: ORAL_TABLET | ORAL | Status: DC

## 2015-01-29 MED ORDER — SERTRALINE HCL 50 MG PO TABS: 75.0000 mg | ORAL_TABLET | Freq: Every day | ORAL | Status: DC

## 2015-01-29 MED ORDER — NORETHINDRONE ACET-ETHINYL EST 1-20 MG-MCG PO TABS: 1.0000 | ORAL_TABLET | Freq: Every day | ORAL | Status: DC

## 2015-01-29 NOTE — Patient Instructions (Signed)
Stop prozac today.  In four days start Sertraline at 25 mgs and maintain this dose for 1 week.  After 1 week at 25 mg double the dose of Sertraline to 50 mg.  After 1 week at 50 mg increase the dose to 75 mg and maintain this dose.  Take Klonopin 0.5 mg twice daily as needed during this transition.

## 2015-01-29 NOTE — Progress Notes (Signed)
01/29/2015 at 4:53 PM  Zoe Wiley / DOB: [birth year] / MRN: [id number]  The patient has Generalized anxiety disorder; Syncope; RBBB; Ectopic atrial rhythm; and ADD (attention deficit disorder) on her problem list.  SUBJECTIVE  Chief complaint: Medication Refill   Zoe Wiley is a pleasant 29 yo female here today for medication refills of klonopin and Adderall.  She reports she is trying to get pregnant, and is concerned that the medications that she is currently taking may harm her future fetus, and reports she would like to discuss other options to treat her anxiety. She reports her anxiousness has been improved on 40 mg of Prozac, but she still requires a klonopin from time to time.    She  has a past medical history of Anxiety; Syncope (12/21/2014); RBBB (12/21/2014); and Ectopic atrial rhythm (12/21/2014).    Medications reviewed and updated by myself where necessary, and exist elsewhere in the encounter.   Zoe Wiley has No Known Allergies. She  reports that she quit smoking about 18 months ago. Her smoking use included Cigarettes. She has a 3.5 pack-year smoking history. She does not have any smokeless tobacco history on file. She reports that she drinks about 0.6 oz of alcohol per week. She reports that she does not use illicit drugs. She  has no sexual activity history on file. The patient  has past surgical history that includes Fracture surgery.  Her family history includes Arrhythmia in her father.  Review of Systems  Constitutional: Negative for fever.  Eyes: Negative for blurred vision.  Respiratory: Negative for cough.   Cardiovascular: Negative for chest pain.  Gastrointestinal: Negative for heartburn and nausea.  Genitourinary: Negative for dysuria.  Musculoskeletal: Negative for myalgias.  Skin: Negative for rash.  Neurological: Negative for dizziness and headaches.  Psychiatric/Behavioral: Negative for depression.    OBJECTIVE  Her  height is 5\' 6"  (1.676 m) and  weight is 129 lb (58.514 kg). Her oral temperature is 98.2 F (36.8 C). Her blood pressure is 116/64 and her pulse is 69. Her respiration is 18 and oxygen saturation is 99%.  The patient's body mass index is 20.83 kg/(m^2).  Physical Exam  Constitutional: She is oriented to person, place, and time. She appears well-developed and well-nourished.  Cardiovascular: Normal rate and regular rhythm.   Respiratory: Effort normal and breath sounds normal.  GI: Soft.  Musculoskeletal: Normal range of motion.  Neurological: She is alert and oriented to person, place, and time. She has normal reflexes.  Skin: Skin is warm and dry.  Psychiatric: She has a normal mood and affect. Her behavior is normal. Judgment and thought content normal.    Results for orders placed or performed in visit on 01/29/15 (from the past 24 hour(s))  POCT urine pregnancy     Status: None   Collection Time: 01/29/15  4:29 PM  Result Value Ref Range   Preg Test, Ur Negative     ASSESSMENT & PLAN  [name] was seen today for medication refill.  Diagnoses and all orders for this visit:  Generalized anxiety disorder: Moving patient to Sertraline as the literature suggest it is the safest option for pregnancy. Will likely need to titrate up in order to go off benzodiazepine for future pregnancy.  Patient to take OCPs for the next month, or two, while moving from Prozac to Sertraline, as she is more likely to need klonopin during this switch.   Orders: -     sertraline (ZOLOFT) 50 MG tablet; Take 1.5  tablets (75 mg total) by mouth daily. -     clonazePAM (KLONOPIN) 0.5 MG tablet; TAKE 1/2-1 TABLET BY MOUTH TWICE DAILY AS NEEDED.   ADD (attention deficit disorder): Will hold on therapy for now, as this medication may be pushing her into an anxious state.    Possible pregnancy Orders: -     POCT urine pregnancy -     norethindrone-ethinyl estradiol (MICROGESTIN) 1-20 MG-MCG tablet; Take 1 tablet by mouth daily.    The  patient was advised to call or come back to clinic if she does not see an improvement in symptoms, or worsens with the above plan.   Philis Fendt, MHS, PA-C [facility] 01/29/2015 4:53 PM

## 2015-02-04 ENCOUNTER — Telehealth: Payer: Self-pay | Admitting: Neurology

## 2015-02-04 NOTE — Telephone Encounter (Signed)
appt cancelled per pt request (via automated appt reminder system). I have left message for pt to call and r/s if desired. Referring provider's office notified of canc via EPIC referral /  Sherri S.

## 2015-02-06 ENCOUNTER — Ambulatory Visit: Payer: BLUE CROSS/BLUE SHIELD | Admitting: Neurology

## 2015-02-08 ENCOUNTER — Encounter: Payer: Self-pay | Admitting: Cardiology

## 2015-02-19 ENCOUNTER — Telehealth: Payer: Self-pay | Admitting: Internal Medicine

## 2015-02-19 NOTE — Telephone Encounter (Signed)
Answering service call She took 1 fluoxetine from her past prescription instead of Zoloft tonight and worried that it might be a problem I reassured her and she will resume her regular medicine tomorrow

## 2015-02-25 ENCOUNTER — Telehealth: Payer: Self-pay

## 2015-02-25 ENCOUNTER — Other Ambulatory Visit: Payer: Self-pay | Admitting: Physician Assistant

## 2015-02-25 DIAGNOSIS — F988 Other specified behavioral and emotional disorders with onset usually occurring in childhood and adolescence: Secondary | ICD-10-CM

## 2015-02-25 MED ORDER — AMPHETAMINE-DEXTROAMPHETAMINE 10 MG PO TABS
10.0000 mg | ORAL_TABLET | Freq: Two times a day (BID) | ORAL | Status: DC
Start: 2015-04-26 — End: 2015-05-26

## 2015-02-25 MED ORDER — AMPHETAMINE-DEXTROAMPHETAMINE 10 MG PO TABS: 10.0000 mg | ORAL_TABLET | Freq: Two times a day (BID) | ORAL | Status: DC

## 2015-02-25 NOTE — Telephone Encounter (Signed)
Spoke with patient. She is taking birth control and her plans for pregnancy have changed.  No longer needs klonopin.  Has been taking 75 of sertraline with excellent results.  Aderall refilled.

## 2015-02-25 NOTE — Telephone Encounter (Signed)
Spoke with pt, she decided to wait and she is now on birth control.

## 2015-02-25 NOTE — Telephone Encounter (Signed)
amphetamine-dextroamphetamine (ADDERALL) 10 MG tablet   [phone] (H)

## 2015-02-25 NOTE — Telephone Encounter (Signed)
Please clarify.  When here last, the patient was trying to become pregnant. Adderall is not recommended during pregnancy.  Zoe Wiley-you saw her last, and perhaps have some insight?

## 2015-03-04 ENCOUNTER — Ambulatory Visit: Payer: BLUE CROSS/BLUE SHIELD | Admitting: Physician Assistant

## 2015-04-08 ENCOUNTER — Telehealth: Payer: Self-pay

## 2015-04-08 NOTE — Telephone Encounter (Signed)
Wants to talk with Philis Fendt to increase Zoloft  [phone]

## 2015-04-09 ENCOUNTER — Other Ambulatory Visit: Payer: Self-pay | Admitting: Physician Assistant

## 2015-04-09 DIAGNOSIS — F411 Generalized anxiety disorder: Secondary | ICD-10-CM

## 2015-04-09 MED ORDER — SERTRALINE HCL 100 MG PO TABS: 100.0000 mg | ORAL_TABLET | Freq: Every day | ORAL | Status: DC

## 2015-04-09 NOTE — Progress Notes (Signed)
Spoke with patient.  She reports she is feeling much better and is enjoying her life much more. Is now in the process o  Has much less anxiety but still suffers with mild symptoms.  She would like to increase her medication to 100 mg qd.  I agree with this plan and will send a script to pharmacy.  Patient is desiring to become pregnant in the future, however not soon. Advised that once she reaches that point she will need to stop taking Adderall. Philis Fendt, MS, PA-C   8:18 PM, 04/09/2015

## 2015-07-02 ENCOUNTER — Telehealth: Payer: Self-pay

## 2015-07-02 NOTE — Telephone Encounter (Signed)
PATIENT WOULD LIKE MICHAEL CLARK TO CALL HER BACK. SHE SAID SHE TALKED WITH HIM ABOUT MAYBE INCREASING HER ZOLOFT. HE TOLD HER TO GIVE IT AT LEAST 1 MONTH TO SEE HOW SHE FEELS WITH HER CURRENT DOSAGE. SHE HAS GIVEN IT 2 MONTHS NOW AND SHE WOULD LIKE TO GO AHEAD AND INCREASE IT TO THE NEXT DOSAGE. BEST PHONE [phone] (CELL)  [place], [place]

## 2015-07-06 ENCOUNTER — Other Ambulatory Visit: Payer: Self-pay | Admitting: *Deleted

## 2015-07-06 ENCOUNTER — Other Ambulatory Visit: Payer: Self-pay | Admitting: Physician Assistant

## 2015-07-06 DIAGNOSIS — F419 Anxiety disorder, unspecified: Secondary | ICD-10-CM

## 2015-07-06 MED ORDER — SERTRALINE HCL 25 MG PO TABS
ORAL_TABLET | ORAL | Status: DC
Start: 2015-07-06 — End: 2016-02-21

## 2015-07-06 MED ORDER — SERTRALINE HCL 100 MG PO TABS: ORAL_TABLET | ORAL | Status: DC

## 2015-07-06 MED ORDER — SERTRALINE HCL 25 MG PO TABS: ORAL_TABLET | ORAL | Status: DC

## 2015-07-06 NOTE — Telephone Encounter (Signed)
Mailbox is full.

## 2015-07-06 NOTE — Telephone Encounter (Signed)
I have sent refills to the pharmacy.  She is to take 125 mg total daily.  She will have two prescriptions, one for 100 mg tabs and one for 25 mg tabs.

## 2015-07-10 NOTE — Telephone Encounter (Signed)
Spoke with pt, advised message from [place], pt understood.

## 2015-07-27 ENCOUNTER — Ambulatory Visit (INDEPENDENT_AMBULATORY_CARE_PROVIDER_SITE_OTHER): Payer: BLUE CROSS/BLUE SHIELD | Admitting: Urgent Care

## 2015-07-27 VITALS — BP 100/60 | HR 67 | Temp 98.0°F | Resp 18 | Ht 66.0 in | Wt 138.0 lb

## 2015-07-27 DIAGNOSIS — Z3201 Encounter for pregnancy test, result positive: Secondary | ICD-10-CM | POA: Diagnosis not present

## 2015-07-27 LAB — POCT URINE PREGNANCY: Preg Test, Ur: POSITIVE — AB

## 2015-07-27 NOTE — Patient Instructions (Signed)
First Trimester of Pregnancy The first trimester of pregnancy is from week 1 until the end of week 12 (months 1 through 3). A week after a sperm fertilizes an egg, the egg will implant on the wall of the uterus. This embryo will begin to develop into a baby. Genes from you and your partner are forming the baby. The female genes determine whether the baby is a boy or a girl. At 6-8 weeks, the eyes and face are formed, and the heartbeat can be seen on ultrasound. At the end of 12 weeks, all the baby's organs are formed.  Now that you are pregnant, you will want to do everything you can to have a healthy baby. Two of the most important things are to get good prenatal care and to follow your health care provider's instructions. Prenatal care is all the medical care you receive before the baby's birth. This care will help prevent, find, and treat any problems during the pregnancy and childbirth. BODY CHANGES Your body goes through many changes during pregnancy. The changes vary from woman to woman.   You may gain or lose a couple of pounds at first.  You may feel sick to your stomach (nauseous) and throw up (vomit). If the vomiting is uncontrollable, call your health care provider.  You may tire easily.  You may develop headaches that can be relieved by medicines approved by your health care provider.  You may urinate more often. Painful urination may mean you have a bladder infection.  You may develop heartburn as a result of your pregnancy.  You may develop constipation because certain hormones are causing the muscles that push waste through your intestines to slow down.  You may develop hemorrhoids or swollen, bulging veins (varicose veins).  Your breasts may begin to grow larger and become tender. Your nipples may stick out more, and the tissue that surrounds them (areola) may become darker.  Your gums may bleed and may be sensitive to brushing and flossing.  Dark spots or blotches (chloasma,  mask of pregnancy) may develop on your face. This will likely fade after the baby is born.  Your menstrual periods will stop.  You may have a loss of appetite.  You may develop cravings for certain kinds of food.  You may have changes in your emotions from day to day, such as being excited to be pregnant or being concerned that something may go wrong with the pregnancy and baby.  You may have more vivid and strange dreams.  You may have changes in your hair. These can include thickening of your hair, rapid growth, and changes in texture. Some women also have hair loss during or after pregnancy, or hair that feels dry or thin. Your hair will most likely return to normal after your baby is born. WHAT TO EXPECT AT YOUR PRENATAL VISITS During a routine prenatal visit:  You will be weighed to make sure you and the baby are growing normally.  Your blood pressure will be taken.  Your abdomen will be measured to track your baby's growth.  The fetal heartbeat will be listened to starting around week 10 or 12 of your pregnancy.  Test results from any previous visits will be discussed. Your health care provider may ask you:  How you are feeling.  If you are feeling the baby move.  If you have had any abnormal symptoms, such as leaking fluid, bleeding, severe headaches, or abdominal cramping.  If you are using any tobacco products,   including cigarettes, chewing tobacco, and electronic cigarettes.  If you have any questions. Other tests that may be performed during your first trimester include:  Blood tests to find your blood type and to check for the presence of any previous infections. They will also be used to check for low iron levels (anemia) and Rh antibodies. Later in the pregnancy, blood tests for diabetes will be done along with other tests if problems develop.  Urine tests to check for infections, diabetes, or protein in the urine.  An ultrasound to confirm the proper growth  and development of the baby.  An amniocentesis to check for possible genetic problems.  Fetal screens for spina bifida and Down syndrome.  You may need other tests to make sure you and the baby are doing well.  HIV (human immunodeficiency virus) testing. Routine prenatal testing includes screening for HIV, unless you choose not to have this test. HOME CARE INSTRUCTIONS  Medicines  Follow your health care provider's instructions regarding medicine use. Specific medicines may be either safe or unsafe to take during pregnancy.  Take your prenatal vitamins as directed.  If you develop constipation, try taking a stool softener if your health care provider approves. Diet  Eat regular, well-balanced meals. Choose a variety of foods, such as meat or vegetable-based protein, fish, milk and low-fat dairy products, vegetables, fruits, and whole grain breads and cereals. Your health care provider will help you determine the amount of weight gain that is right for you.  Avoid raw meat and uncooked cheese. These carry germs that can cause birth defects in the baby.  Eating four or five small meals rather than three large meals a day may help relieve nausea and vomiting. If you start to feel nauseous, eating a few soda crackers can be helpful. Drinking liquids between meals instead of during meals also seems to help nausea and vomiting.  If you develop constipation, eat more high-fiber foods, such as fresh vegetables or fruit and whole grains. Drink enough fluids to keep your urine clear or pale yellow. Activity and Exercise  Exercise only as directed by your health care provider. Exercising will help you:  Control your weight.  Stay in shape.  Be prepared for labor and delivery.  Experiencing pain or cramping in the lower abdomen or low back is a good sign that you should stop exercising. Check with your health care provider before continuing normal exercises.  Try to avoid standing for long  periods of time. Move your legs often if you must stand in one place for a long time.  Avoid heavy lifting.  Wear low-heeled shoes, and practice good posture.  You may continue to have sex unless your health care provider directs you otherwise. Relief of Pain or Discomfort  Wear a good support bra for breast tenderness.   Take warm sitz baths to soothe any pain or discomfort caused by hemorrhoids. Use hemorrhoid cream if your health care provider approves.   Rest with your legs elevated if you have leg cramps or low back pain.  If you develop varicose veins in your legs, wear support hose. Elevate your feet for 15 minutes, 3-4 times a day. Limit salt in your diet. Prenatal Care  Schedule your prenatal visits by the twelfth week of pregnancy. They are usually scheduled monthly at first, then more often in the last 2 months before delivery.  Write down your questions. Take them to your prenatal visits.  Keep all your prenatal visits as directed by your   health care provider. Safety  Wear your seat belt at all times when driving.  Make a list of emergency phone numbers, including numbers for family, friends, the hospital, and police and fire departments. General Tips  Ask your health care provider for a referral to a local prenatal education class. Begin classes no later than at the beginning of month 6 of your pregnancy.  Ask for help if you have counseling or nutritional needs during pregnancy. Your health care provider can offer advice or refer you to specialists for help with various needs.  Do not use hot tubs, steam rooms, or saunas.  Do not douche or use tampons or scented sanitary pads.  Do not cross your legs for long periods of time.  Avoid cat litter boxes and soil used by cats. These carry germs that can cause birth defects in the baby and possibly loss of the fetus by miscarriage or stillbirth.  Avoid all smoking, herbs, alcohol, and medicines not prescribed by  your health care provider. Chemicals in these affect the formation and growth of the baby.  Do not use any tobacco products, including cigarettes, chewing tobacco, and electronic cigarettes. If you need help quitting, ask your health care provider. You may receive counseling support and other resources to help you quit.  Schedule a dentist appointment. At home, brush your teeth with a soft toothbrush and be gentle when you floss. SEEK MEDICAL CARE IF:   You have dizziness.  You have mild pelvic cramps, pelvic pressure, or nagging pain in the abdominal area.  You have persistent nausea, vomiting, or diarrhea.  You have a bad smelling vaginal discharge.  You have pain with urination.  You notice increased swelling in your face, hands, legs, or ankles. SEEK IMMEDIATE MEDICAL CARE IF:   You have a fever.  You are leaking fluid from your vagina.  You have spotting or bleeding from your vagina.  You have severe abdominal cramping or pain.  You have rapid weight gain or loss.  You vomit blood or material that looks like coffee grounds.  You are exposed to German measles and have never had them.  You are exposed to fifth disease or chickenpox.  You develop a severe headache.  You have shortness of breath.  You have any kind of trauma, such as from a fall or a car accident.   This information is not intended to replace advice given to you by your health care provider. Make sure you discuss any questions you have with your health care provider.   Document Released: 09/22/2001 Document Revised: 10/19/2014 Document Reviewed: 08/08/2013 Elsevier Interactive Patient Education 2016 [employer]  

## 2015-07-27 NOTE — Progress Notes (Signed)
    MRN: [id number] DOB: 10-Oct-1986  Subjective:   Zoe Wiley is a 29 y.o. female presenting for chief complaint of Possible Pregnancy  Reports missing her cycle during the first week of this month. Patient took a urine pregnancy test at home which was positive. She would like counseling in confirmation of possible pregnancy today. She is currently engaged and here with her fianc, they plan on getting married in June of 2017. She currently uses Vape cigarettes. Denies smoking cigarettes or drinking alcohol. Denies fever, nausea, vomiting, abdominal pain, vaginal bleeding, abnormal vaginal discharge. Denies any other aggravating or relieving factors, no other questions or concerns.  Zoe Wiley has a current medication list which includes the following prescription(s): sertraline and sertraline. Also has No Known Allergies.  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 Also  has past surgical history that includes Fracture surgery.  Objective:   Vitals: BP 100/60 mmHg  Pulse 67  Temp(Src) 98 F (36.7 C) (Oral)  Resp 18  Ht 5\' 6"  (1.676 m)  Wt 138 lb (62.596 kg)  BMI 22.28 kg/m2  SpO2 99%  LMP 06/20/2015 (Approximate)  Physical Exam  Constitutional: She is oriented to person, place, and time. She appears well-developed and well-nourished.  HENT:  Mouth/Throat: Oropharynx is clear and moist.  Eyes: No scleral icterus.  Cardiovascular: Normal rate, regular rhythm and intact distal pulses.  Exam reveals no gallop and no friction rub.   No murmur heard. Pulmonary/Chest: No respiratory distress. She has no wheezes. She has no rales.  Abdominal: Soft. Bowel sounds are normal. She exhibits no distension and no mass. There is no tenderness.  Musculoskeletal: She exhibits no edema.  Neurological: She is alert and oriented to person, place, and time.  Skin: Skin is warm and dry. No rash noted. No erythema. No pallor.    Psychiatric: She has a normal mood and affect.   Results for orders placed or performed in visit on 07/27/15 (from the past 24 hour(s))  POCT urine pregnancy     Status: Abnormal   Collection Time: 07/27/15 11:09 AM  Result Value Ref Range   Preg Test, Ur Positive (A) Negative   Assessment and Plan :   1. Pregnancy confirmed by positive urine test - Patient and her fianc were very happy to have their pregnancy confirmed, I congratulated them and provided anticipatory guidance. - Referral to [place] patient start taking prenatal vitamin, stop using Vape cigarettes. She may continue Zoloft unless told otherwise by her obstetrician.  Jaynee Eagles, PA-C [facility] [phone] 07/27/2015 10:59 AM

## 2015-08-01 ENCOUNTER — Encounter: Payer: Self-pay | Admitting: Certified Nurse Midwife

## 2015-08-14 ENCOUNTER — Encounter: Payer: BLUE CROSS/BLUE SHIELD | Admitting: Certified Nurse Midwife

## 2015-08-21 ENCOUNTER — Encounter: Payer: BLUE CROSS/BLUE SHIELD | Admitting: Certified Nurse Midwife

## 2015-08-21 ENCOUNTER — Encounter: Payer: Self-pay | Admitting: Certified Nurse Midwife

## 2015-09-09 ENCOUNTER — Encounter: Payer: Self-pay | Admitting: Internal Medicine

## 2015-09-16 ENCOUNTER — Inpatient Hospital Stay (HOSPITAL_COMMUNITY): Payer: BLUE CROSS/BLUE SHIELD

## 2015-09-16 ENCOUNTER — Encounter (HOSPITAL_COMMUNITY): Payer: Self-pay

## 2015-09-16 ENCOUNTER — Inpatient Hospital Stay (HOSPITAL_COMMUNITY)
Admission: AD | Admit: 2015-09-16 | Discharge: 2015-09-16 | Disposition: A | Discharge status: Home / Self Care | Payer: BLUE CROSS/BLUE SHIELD | Source: Ambulatory Visit | Attending: Obstetrics and Gynecology | Admitting: Obstetrics and Gynecology

## 2015-09-16 DIAGNOSIS — O039 Complete or unspecified spontaneous abortion without complication: Secondary | ICD-10-CM | POA: Diagnosis not present

## 2015-09-16 DIAGNOSIS — Z3A12 12 weeks gestation of pregnancy: Secondary | ICD-10-CM | POA: Diagnosis not present

## 2015-09-16 DIAGNOSIS — O209 Hemorrhage in early pregnancy, unspecified: Secondary | ICD-10-CM

## 2015-09-16 LAB — URINALYSIS, ROUTINE W REFLEX MICROSCOPIC
Bilirubin Urine: NEGATIVE
Glucose, UA: NEGATIVE mg/dL
KETONES UR: NEGATIVE mg/dL
Leukocytes, UA: NEGATIVE
Nitrite: NEGATIVE
Protein, ur: NEGATIVE mg/dL
pH: 6.5 (ref 5.0–8.0)

## 2015-09-16 LAB — CBC
HCT: 33.3 % — ABNORMAL LOW (ref 36.0–46.0)
HEMATOCRIT: 35.7 % — AB (ref 36.0–46.0)
Hemoglobin: 12.3 g/dL (ref 12.0–15.0)
Hemoglobin: 11.6 g/dL — ABNORMAL LOW (ref 12.0–15.0)
MCH: 30.7 pg (ref 26.0–34.0)
MCH: 30.6 pg (ref 26.0–34.0)
MCHC: 34.5 g/dL (ref 30.0–36.0)
MCHC: 34.8 g/dL (ref 30.0–36.0)
MCV: 89 fL (ref 78.0–100.0)
MCV: 87.9 fL (ref 78.0–100.0)
PLATELETS: 197 10*3/uL (ref 150–400)
Platelets: 191 10*3/uL (ref 150–400)
RBC: 4.01 MIL/uL (ref 3.87–5.11)
RBC: 3.79 MIL/uL — AB (ref 3.87–5.11)
RDW: 12.5 % (ref 11.5–15.5)
RDW: 12.5 % (ref 11.5–15.5)
WBC: 9.4 10*3/uL (ref 4.0–10.5)
WBC: 10.6 10*3/uL — AB (ref 4.0–10.5)

## 2015-09-16 LAB — URINE MICROSCOPIC-ADD ON

## 2015-09-16 LAB — ABO/RH: ABO/RH(D): A POS

## 2015-09-16 MED ORDER — ACETAMINOPHEN 325 MG PO TABS
650.0000 mg | ORAL_TABLET | Freq: Once | ORAL | Status: DC
  Filled 2015-09-16: qty 2

## 2015-09-16 MED ORDER — IBUPROFEN 600 MG PO TABS: 600.0000 mg | ORAL_TABLET | Freq: Four times a day (QID) | ORAL | Status: DC | PRN

## 2015-09-16 MED ORDER — OXYCODONE-ACETAMINOPHEN 5-325 MG PO TABS
2.0000 | ORAL_TABLET | Freq: Once | ORAL | Status: AC
  Administered 2015-09-16: 2 via ORAL
  Filled 2015-09-16: qty 2

## 2015-09-16 MED ORDER — LACTATED RINGERS IV BOLUS (SEPSIS)
1000.0000 mL | Freq: Once | INTRAVENOUS | Status: AC
  Administered 2015-09-16: 1000 mL via INTRAVENOUS

## 2015-09-16 NOTE — MAU Note (Signed)
Pt c/o light vaginal bleeding earlier today; states that it is getting heavier. Is currently wearing a pad and states that she has changed it twice today. Started having some lower abdominal cramping that started a few hours ago-rates 3/10. EDD 03/25/2016 by u/s at Dr Willis [place] office

## 2015-09-16 NOTE — Discharge Instructions (Signed)
Miscarriage  A miscarriage is the sudden loss of an unborn baby (fetus) before the 20th week of pregnancy. Most miscarriages happen in the first 3 months of pregnancy. Sometimes, it happens before a woman even knows she is pregnant. A miscarriage is also called a "spontaneous miscarriage" or "early pregnancy loss." Having a miscarriage can be an emotional experience. Talk with your caregiver about any questions you may have about miscarrying, the grieving process, and your future pregnancy plans.  CAUSES    Problems with the fetal chromosomes that make it impossible for the baby to develop normally. Problems with the baby's genes or chromosomes are most often the result of errors that occur, by chance, as the embryo divides and grows. The problems are not inherited from the parents.   Infection of the cervix or uterus.    Hormone problems.    Problems with the cervix, such as having an incompetent cervix. This is when the tissue in the cervix is not strong enough to hold the pregnancy.    Problems with the uterus, such as an abnormally shaped uterus, uterine fibroids, or congenital abnormalities.    Certain medical conditions.    Smoking, drinking alcohol, or taking illegal drugs.    Trauma.   Often, the cause of a miscarriage is unknown.   SYMPTOMS    Vaginal bleeding or spotting, with or without cramps or pain.   Pain or cramping in the abdomen or lower back.   Passing fluid, tissue, or blood clots from the vagina.  DIAGNOSIS   Your caregiver will perform a physical exam. You may also have an ultrasound to confirm the miscarriage. Blood or urine tests may also be ordered.  TREATMENT    Sometimes, treatment is not necessary if you naturally pass all the fetal tissue that was in the uterus. If some of the fetus or placenta remains in the body (incomplete miscarriage), tissue left behind may become infected and must be removed. Usually, a dilation and curettage (D and C) procedure is performed.  During a D and C procedure, the cervix is widened (dilated) and any remaining fetal or placental tissue is gently removed from the uterus.   Antibiotic medicines are prescribed if there is an infection. Other medicines may be given to reduce the size of the uterus (contract) if there is a lot of bleeding.   If you have Rh negative blood and your baby was Rh positive, you will need a Rh immunoglobulin shot. This shot will protect any future baby from having Rh blood problems in future pregnancies.  HOME CARE INSTRUCTIONS    Your caregiver may order bed rest or may allow you to continue light activity. Resume activity as directed by your caregiver.   Have someone help with home and family responsibilities during this time.    Keep track of the number of sanitary pads you use each day and how soaked (saturated) they are. Write down this information.    Do not use tampons. Do not douche or have sexual intercourse until approved by your caregiver.    Only take over-the-counter or prescription medicines for pain or discomfort as directed by your caregiver.    Do not take aspirin. Aspirin can cause bleeding.    Keep all follow-up appointments with your caregiver.    If you or your partner have problems with grieving, talk to your caregiver or seek counseling to help cope with the pregnancy loss. Allow enough time to grieve before trying to get pregnant again.     SEEK IMMEDIATE MEDICAL CARE IF:    You have severe cramps or pain in your back or abdomen.   You have a fever.   You pass large blood clots (walnut-sized or larger) ortissue from your vagina. Save any tissue for your caregiver to inspect.    Your bleeding increases.    You have a thick, bad-smelling vaginal discharge.   You become lightheaded, weak, or you faint.    You have chills.   MAKE SURE YOU:   Understand these instructions.   Will watch your condition.   Will get help right away if you are not doing well or get worse.     This  information is not intended to replace advice given to you by your health care provider. Make sure you discuss any questions you have with your health care provider.     Document Released: 03/24/2001 Document Revised: 01/23/2013 Document Reviewed: 11/17/2011  Elsevier Interactive Patient Education 2016 [employer] Inc.

## 2015-09-16 NOTE — Progress Notes (Addendum)
Pt was lying in bed and experiencing pain when I arrived. Lennette Bihari, SO, was bedside. They each described their feelings of devastated. She (voluntarily) said she didn't know what this meant for their future as a family. She talked about her Christian faith. She seemed to accept her baby going to be in heaven-but still sad and concerned. She wanted prayer for which she and Lennette Bihari were very appreciative. Please page if additional support is needed. [place]

## 2015-09-16 NOTE — MAU Provider Note (Signed)
Chief Complaint: Vaginal Bleeding and Abdominal Cramping   First Provider Initiated Contact with Patient 09/16/15 0117     SUBJECTIVE HPI: Zoe Wiley is a 29 y.o. G1P0 at [redacted]w[redacted]d who presents to Maternity Admissions reporting bleeding and low abd cramping that started 09/15/15 and worsened overnight. Had [place] at [place] about 1 month ago and has US showing 9 week live fetus per pt.    Location: low abd Quality: Cramping Severity: 3/10 on pain scale Duration: 12 hours Context: None Timing: Intermittent Modifying factors: Nothing. Hasn't tried anything for pain. Associated signs and symptoms: Positive for vaginal bleeding. Negative for fever, chills, passage of tissue, vaginal discharge, nausea, vomiting, diarrhea, constipation, urinary complaints.  Past Medical History  Diagnosis Date  . Anxiety   . Syncope 12/21/2014  . RBBB 12/21/2014  . 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 Fracture surgery      bilateral wrist   Social History   Social History  . Marital Status: Single    Spouse Name: N/A  . Number of Children: N/A  . Years of Education: N/A   Occupational History  . Not on file.   Social History Main Topics  . Smoking status: Former Smoker -- 0.50 packs/day for 7 years    Types: Cigarettes    Quit date: 07/15/2013  . Smokeless tobacco: Not on file  . Alcohol Use: 0.6 oz/week    1 Standard drinks or equivalent per week     Comment: socially  . Drug Use: No  . Sexual Activity:  yes    Other Topics Concern  . Not on file   Social History Narrative   No current facility-administered medications on file prior to encounter.   Current Outpatient Prescriptions on File Prior to Encounter  Medication Sig Dispense Refill  .  sertraline (ZOLOFT) 100 MG tablet Take a total of 125 mg daily. 30 tablet 6  . sertraline (ZOLOFT) 25 MG tablet Take a total of 125 mg daily. 30 tablet 6   No Known Allergies  I have reviewed the past Medical Hx, Surgical Hx, Social Hx, Allergies and Medications.   Review of Systems  Constitutional: Negative for fever and chills.  Gastrointestinal: Positive for abdominal pain. Negative for nausea, vomiting, diarrhea and constipation.  Genitourinary: Positive for vaginal bleeding and pelvic pain. Negative for dysuria, urgency, frequency, hematuria, flank pain, vaginal discharge and vaginal pain.  Musculoskeletal: Negative for back pain.  Neurological: Negative for dizziness.    OBJECTIVE Patient Vitals for the past 24 hrs:  BP Temp Temp src Pulse Resp SpO2 Height Weight  09/16/15 0406 114/57 mmHg - - 72 18 - - -  09/16/15 0035 130/82 mmHg 98.4 F (36.9 C) Oral 73 18 99 % 5' 5.2" (1.656 m) 150 lb (68.04 kg)   Constitutional: Well-developed, well-nourished female in mild distress. Tearful. Cardiovascular: normal rate Respiratory: normal rate and effort.  GI: Abd soft, non-tender, ? Fundus palpable at S/P. MS: Extremities nontender, no edema, normal ROM Neurologic: Alert and oriented x 4.  GU: Neg CVAT.  SPECULUM EXAM: NEFG, moderate amount) blood noted, cervix clean, no polyps.  BIMANUAL: cervix closed;  uterus 10-11 week size, no adnexal tenderness or masses. No CMT.  Unable to Doppler fetal heart tones.  LAB RESULTS Results for orders placed or performed during the hospital encounter of 09/16/15 (from the past 24 hour(s))  Urinalysis, Routine w reflex microscopic (not at [facility])     Status: Abnormal   Collection Time: 09/16/15 12:38 AM  Result Value Ref Range   Color, Urine YELLOW YELLOW   APPearance CLEAR CLEAR   Specific Gravity, Urine <1.005 (L) 1.005 - 1.030   pH 6.5 5.0 - 8.0   Glucose, UA NEGATIVE NEGATIVE mg/dL   Hgb urine dipstick LARGE (A) NEGATIVE   Bilirubin Urine  NEGATIVE NEGATIVE   Ketones, ur NEGATIVE NEGATIVE mg/dL   Protein, ur NEGATIVE NEGATIVE mg/dL   Nitrite NEGATIVE NEGATIVE   Leukocytes, UA NEGATIVE NEGATIVE  Urine microscopic-add on     Status: Abnormal   Collection Time: 09/16/15 12:38 AM  Result Value Ref Range   Squamous Epithelial / LPF 0-5 (A) NONE SEEN   WBC, UA 0-5 0 - 5 WBC/hpf   RBC / HPF 0-5 0 - 5 RBC/hpf   Bacteria, UA RARE (A) NONE SEEN  CBC     Status: Abnormal   Collection Time: 09/16/15  1:10 AM  Result Value Ref Range   WBC 9.4 4.0 - 10.5 K/uL   RBC 4.01 3.87 - 5.11 MIL/uL   Hemoglobin 12.3 12.0 - 15.0 g/dL   HCT 35.7 (L) 36.0 - 46.0 %   MCV 89.0 78.0 - 100.0 fL   MCH 30.7 26.0 - 34.0 pg   MCHC 34.5 30.0 - 36.0 g/dL   RDW 12.5 11.5 - 15.5 %   Platelets 197 150 - 400 K/uL  ABO/Rh     Status: None (Preliminary result)   Collection Time: 09/16/15  1:10 AM  Result Value Ref Range   ABO/RH(D) A POS   CBC     Status: Abnormal   Collection Time: 09/16/15  3:30 AM  Result Value Ref Range   WBC 10.6 (H) 4.0 - 10.5 K/uL   RBC 3.79 (L) 3.87 - 5.11 MIL/uL   Hemoglobin 11.6 (L) 12.0 - 15.0 g/dL   HCT 33.3 (L) 36.0 - 46.0 %   MCV 87.9 78.0 - 100.0 fL   MCH 30.6 26.0 - 34.0 pg   MCHC 34.8 30.0 - 36.0 g/dL   RDW 12.5 11.5 - 15.5 %   Platelets 191 150 - 400 K/uL    IMAGING US Ob Comp Less 14 Wks  09/16/2015  CLINICAL DATA:  Acute onset of vaginal bleeding.  Initial encounter. EXAM: OBSTETRIC <14 WK ULTRASOUND TECHNIQUE: Transabdominal ultrasound was performed for evaluation of the gestation as well as the maternal uterus and adnexal regions. COMPARISON:  None. FINDINGS: Intrauterine gestational sac: Visualized; elongated and extends through the cervix, compatible with spontaneous abortion in progress. Yolk sac:  Yes Embryo:  Yes Cardiac Activity: No CRL:   2.03 cm   8 w 4 d Maternal uterus/adnexae: A small amount of subchorionic hemorrhage is noted. The uterus is otherwise unremarkable. The ovaries are not evaluated on  this study. No free fluid is seen within the pelvic cul-de-sac. IMPRESSION: No fetal heartbeat seen. The intrauterine gestational sac is elongated and extends through the cervix, compatible with spontaneous abortion in progress. Small amount of subchorionic hemorrhage noted. Electronically Signed   By: Garald Balding M.D.   On: 09/16/2015 01:59    MAU COURSE CBC, type and screen, ultrasound.  Patient is much more pain after  coming back from ultrasound. Requesting pain medicine. Percocet ordered. Dr. Melba Coon notified of patient's bleeding, ultrasound and CBC. If Bleeding is stable patient may be discharged home with plan for follow-up on Wednesday with Dr. Willis Modena. Chaplain in to speak with patient.  No relief of pain with Percocet. Patient appears extremely uncomfortable. Pelvic exam repeated. Vaginal vault full of blood and clots which, when cleared revealed a gestational sac coming through the cervical os. Sac grasped with ring forceps and removed in pieces, but last, large fragment appeared to be removed intact. Bleeding and cramping decreased significantly immediately afterward. ~400 cc blood loss total. Will repeat CBC and give 1 L fluid bolus.  Patient up to bathroom without dizziness. Bleeding stable. Hemoglobin dropped from 12.3-11.6.   MDM 29 year old female with complete miscarriage, hemodynamically stable.  ASSESSMENT 1. Complete miscarriage   2. Vaginal bleeding in pregnancy, first trimester     PLAN Discharge home in stable condition per consult with Dr. Melba Coon. Bleeding Precautions Support given. Work note given. Follow-up Information    Follow up with MEISINGER,TODD D, MD On 09/18/2015.   Specialty:  Obstetrics and Gynecology   Why:  As scheduled or sooner as needed if symptoms worsen   Contact information:   [address] [place] [address] [phone]       Follow up with [facility].   Why:  As  needed in emergencies   Contact information:   [address] [id number] [place] [phone]       Medication List    TAKE these medications        ibuprofen 600 MG tablet  Commonly known as:  ADVIL,MOTRIN  Take 1 tablet (600 mg total) by mouth every 6 (six) hours as needed.     sertraline 100 MG tablet  Commonly known as:  ZOLOFT  Take a total of 125 mg daily.     sertraline 25 MG tablet  Commonly known as:  ZOLOFT  Take a total of 125 mg daily.       [place], [place] 09/16/2015  3:27 AM

## 2016-02-11 ENCOUNTER — Other Ambulatory Visit: Payer: Self-pay | Admitting: Physician Assistant

## 2016-02-21 ENCOUNTER — Other Ambulatory Visit: Payer: Self-pay | Admitting: Physician Assistant

## 2016-04-23 LAB — OB RESULTS CONSOLE GC/CHLAMYDIA
Chlamydia: NEGATIVE
GC PROBE AMP, GENITAL: NEGATIVE

## 2016-04-23 LAB — OB RESULTS CONSOLE RUBELLA ANTIBODY, IGM: RUBELLA: IMMUNE

## 2016-04-23 LAB — OB RESULTS CONSOLE HEPATITIS B SURFACE ANTIGEN: HEP B S AG: NEGATIVE

## 2016-04-23 LAB — OB RESULTS CONSOLE RPR: RPR: NONREACTIVE

## 2016-04-23 LAB — OB RESULTS CONSOLE HGB/HCT, BLOOD
HEMATOCRIT: 36 %
Hemoglobin: 12.3 g/dL

## 2016-04-23 LAB — OB RESULTS CONSOLE VARICELLA ZOSTER ANTIBODY, IGG: Varicella: IMMUNE

## 2016-07-21 ENCOUNTER — Encounter (HOSPITAL_COMMUNITY): Payer: Self-pay

## 2016-07-23 ENCOUNTER — Other Ambulatory Visit (HOSPITAL_COMMUNITY): Payer: Self-pay | Admitting: Obstetrics and Gynecology

## 2016-07-23 DIAGNOSIS — Z3A23 23 weeks gestation of pregnancy: Secondary | ICD-10-CM

## 2016-07-23 DIAGNOSIS — Z3689 Encounter for other specified antenatal screening: Secondary | ICD-10-CM

## 2016-07-24 ENCOUNTER — Ambulatory Visit (HOSPITAL_COMMUNITY)
Admission: RE | Admit: 2016-07-24 | Discharge: 2016-07-24 | Disposition: A | Discharge status: Home / Self Care | Payer: BLUE CROSS/BLUE SHIELD | Source: Ambulatory Visit | Attending: Obstetrics and Gynecology | Admitting: Obstetrics and Gynecology

## 2016-07-24 ENCOUNTER — Other Ambulatory Visit (HOSPITAL_COMMUNITY): Payer: Self-pay | Admitting: Obstetrics and Gynecology

## 2016-07-24 DIAGNOSIS — Z3689 Encounter for other specified antenatal screening: Secondary | ICD-10-CM

## 2016-07-24 DIAGNOSIS — Z3A23 23 weeks gestation of pregnancy: Secondary | ICD-10-CM | POA: Insufficient documentation

## 2016-07-24 DIAGNOSIS — Z3482 Encounter for supervision of other normal pregnancy, second trimester: Secondary | ICD-10-CM | POA: Diagnosis not present

## 2016-08-13 ENCOUNTER — Encounter: Payer: Self-pay | Admitting: Urgent Care

## 2016-08-24 LAB — OB RESULTS CONSOLE HIV ANTIBODY (ROUTINE TESTING): HIV: NONREACTIVE

## 2016-09-07 ENCOUNTER — Encounter: Payer: BLUE CROSS/BLUE SHIELD | Attending: Obstetrics and Gynecology | Admitting: Skilled Nursing Facility1

## 2016-09-07 ENCOUNTER — Encounter: Payer: Self-pay | Admitting: Skilled Nursing Facility1

## 2016-09-07 DIAGNOSIS — O24419 Gestational diabetes mellitus in pregnancy, unspecified control: Secondary | ICD-10-CM | POA: Diagnosis present

## 2016-09-07 DIAGNOSIS — O99810 Abnormal glucose complicating pregnancy: Secondary | ICD-10-CM

## 2016-09-07 NOTE — Progress Notes (Signed)
  Patient was seen on 09/07/2016 for Gestational Diabetes self-management class at the [facility]. The following learning objectives were met by the patient during this course:   States the definition of Gestational Diabetes  States why dietary management is important in controlling blood glucose  Describes the effects each nutrient has on blood glucose levels  Demonstrates ability to create a balanced meal plan  Demonstrates carbohydrate counting   States when to check blood glucose levels involving a total of 4 separate occurences in a day  Demonstrates proper blood glucose monitoring techniques  States the effect of stress and exercise on blood glucose levels  States the importance of limiting caffeine and abstaining from alcohol and smoking  Demonstrates the knowledge the glucometer provided in class may not be covered by their insurance and to call their insurance provider immediately after class to know which glucometer their insurance provider does cover as well as calling their physician the next day for a prescription to the glucometer their insurance does cover (if the one provided is not) as well as the lancets and strips for that meter.  Blood glucose monitor given: contour next Lot # [id number] Exp: 2017-07-11 Blood glucose reading: 87  Patient instructed to monitor glucose levels: FBS: 60 - <90 1 hour: <140 2 hour: <120  *Patient received handouts:  Nutrition Diabetes and Pregnancy  Carbohydrate Counting List  Patient will be seen for follow-up as needed.

## 2016-10-16 LAB — OB RESULTS CONSOLE GBS: GBS: NEGATIVE

## 2016-11-08 ENCOUNTER — Encounter (HOSPITAL_COMMUNITY): Payer: Self-pay

## 2016-11-08 ENCOUNTER — Inpatient Hospital Stay (HOSPITAL_COMMUNITY)
Admission: AD | Admit: 2016-11-08 | Discharge: 2016-11-11 | DRG: 766 | Disposition: A | Discharge status: Home / Self Care | Payer: BLUE CROSS/BLUE SHIELD | Source: Ambulatory Visit | Attending: Obstetrics and Gynecology | Admitting: Obstetrics and Gynecology

## 2016-11-08 DIAGNOSIS — O24415 Gestational diabetes mellitus in pregnancy, controlled by oral hypoglycemic drugs: Secondary | ICD-10-CM | POA: Diagnosis present

## 2016-11-08 DIAGNOSIS — Z87891 Personal history of nicotine dependence: Secondary | ICD-10-CM | POA: Diagnosis not present

## 2016-11-08 DIAGNOSIS — F419 Anxiety disorder, unspecified: Secondary | ICD-10-CM | POA: Diagnosis present

## 2016-11-08 DIAGNOSIS — O24425 Gestational diabetes mellitus in childbirth, controlled by oral hypoglycemic drugs: Principal | ICD-10-CM | POA: Diagnosis present

## 2016-11-08 DIAGNOSIS — O99344 Other mental disorders complicating childbirth: Secondary | ICD-10-CM | POA: Diagnosis present

## 2016-11-08 DIAGNOSIS — Z3A39 39 weeks gestation of pregnancy: Secondary | ICD-10-CM | POA: Diagnosis not present

## 2016-11-08 HISTORY — DX: Gestational diabetes mellitus in pregnancy, controlled by oral hypoglycemic drugs: O24.415

## 2016-11-08 LAB — CBC
HEMATOCRIT: 36 % (ref 36.0–46.0)
HEMOGLOBIN: 12.3 g/dL (ref 12.0–15.0)
MCH: 30.2 pg (ref 26.0–34.0)
MCHC: 34.2 g/dL (ref 30.0–36.0)
MCV: 88.5 fL (ref 78.0–100.0)
Platelets: 182 10*3/uL (ref 150–400)
RBC: 4.07 MIL/uL (ref 3.87–5.11)
RDW: 14 % (ref 11.5–15.5)
WBC: 9.5 10*3/uL (ref 4.0–10.5)

## 2016-11-08 LAB — GLUCOSE, CAPILLARY: Glucose-Capillary: 174 mg/dL — ABNORMAL HIGH (ref 65–99)

## 2016-11-08 MED ORDER — SOD CITRATE-CITRIC ACID 500-334 MG/5ML PO SOLN
30.0000 mL | ORAL | Status: DC | PRN
  Administered 2016-11-09: 30 mL via ORAL
  Filled 2016-11-08: qty 15

## 2016-11-08 MED ORDER — ACETAMINOPHEN 325 MG PO TABS
650.0000 mg | ORAL_TABLET | ORAL | Status: DC | PRN
  Administered 2016-11-09: 650 mg via ORAL
  Filled 2016-11-08: qty 2

## 2016-11-08 MED ORDER — BUTORPHANOL TARTRATE 1 MG/ML IJ SOLN: 1.0000 mg | INTRAMUSCULAR | Status: DC | PRN

## 2016-11-08 MED ORDER — OXYTOCIN 40 UNITS IN LACTATED RINGERS INFUSION - SIMPLE MED: 2.5000 [IU]/h | INTRAVENOUS | Status: DC

## 2016-11-08 MED ORDER — OXYCODONE-ACETAMINOPHEN 5-325 MG PO TABS: 2.0000 | ORAL_TABLET | ORAL | Status: DC | PRN

## 2016-11-08 MED ORDER — TERBUTALINE SULFATE 1 MG/ML IJ SOLN: 0.2500 mg | Freq: Once | INTRAMUSCULAR | Status: DC | PRN

## 2016-11-08 MED ORDER — LACTATED RINGERS IV SOLN
INTRAVENOUS | Status: DC
  Administered 2016-11-08 – 2016-11-10 (×6): via INTRAVENOUS

## 2016-11-08 MED ORDER — LACTATED RINGERS IV SOLN
500.0000 mL | INTRAVENOUS | Status: DC | PRN
  Administered 2016-11-09: 500 mL via INTRAVENOUS

## 2016-11-08 MED ORDER — ONDANSETRON HCL 4 MG/2ML IJ SOLN
4.0000 mg | Freq: Four times a day (QID) | INTRAMUSCULAR | Status: DC | PRN
  Administered 2016-11-09: 4 mg via INTRAVENOUS
  Filled 2016-11-08: qty 2

## 2016-11-08 MED ORDER — OXYTOCIN BOLUS FROM INFUSION: 500.0000 mL | Freq: Once | INTRAVENOUS | Status: DC

## 2016-11-08 MED ORDER — LIDOCAINE HCL (PF) 1 % IJ SOLN: 30.0000 mL | INTRAMUSCULAR | Status: DC | PRN

## 2016-11-08 MED ORDER — MISOPROSTOL 25 MCG QUARTER TABLET
25.0000 ug | ORAL_TABLET | ORAL | Status: DC | PRN
  Administered 2016-11-08 – 2016-11-09 (×3): 25 ug via VAGINAL
  Filled 2016-11-08 (×3): qty 0.25

## 2016-11-08 MED ORDER — OXYCODONE-ACETAMINOPHEN 5-325 MG PO TABS: 1.0000 | ORAL_TABLET | ORAL | Status: DC | PRN

## 2016-11-08 NOTE — H&P (Signed)
Zoe Wiley is a 31 y.o. female, G1P0, EGA [redacted] weeks with EDC 2-4 presenting for induction for GDM.  Prenatal care complicated by [id number] mostly controlled with Metformin, still has occasional slightly elevated pp sugar.  Also with anxiety controlled with Zoloft.  OB History    Gravida Para Term Preterm AB Living   1             SAB TAB Ectopic Multiple Live Births                 Past Medical History:  Diagnosis Date  . Anxiety   . 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 RBBB 12/21/2014  . Syncope 12/21/2014   Past Surgical History:  Procedure Laterality Date  . FRACTURE SURGERY     bilateral wrist   Family History: family history includes Arrhythmia in her father. Social History:  reports that she quit smoking about 3 years ago. Her smoking use included Cigarettes. She has a 3.50 pack-year smoking history. She does not have any smokeless tobacco history on file. She reports that she drinks about 0.6 oz of alcohol per week . She reports that she does not use drugs.     Maternal Diabetes: Yes:  Diabetes Type:  Insulin/Medication controlled Genetic Screening: Normal Maternal Ultrasounds/Referrals: Normal Fetal Ultrasounds or other Referrals:  None Maternal Substance Abuse:  No Significant Maternal Medications:  Meds include: Other:  Significant Maternal Lab Results:  Lab values include: Group B Strep negative Other Comments:  Metformin for GDM, Zoloft for anxiety  Review of Systems  Respiratory: Negative.   Cardiovascular: Negative.    Maternal Medical History:  Fetal activity: Perceived fetal activity is normal.    Prenatal Complications - Diabetes: gestational. Diabetes is managed by oral agent (monotherapy).        unknown if currently breastfeeding. Maternal Exam:  Abdomen: Patient reports no abdominal tenderness. Estimated fetal weight is 8 lbs.   Fetal presentation: vertex  Introitus: Normal vulva. Normal vagina.  Pelvis: adequate for delivery.      Physical Exam   Vitals reviewed. Constitutional: She appears well-developed and well-nourished.  Cardiovascular: Normal rate and regular rhythm.   Respiratory: Effort normal. No respiratory distress.  GI: Soft.    Prenatal labs: ABO, Rh:  A pos Antibody:  neg Rubella:  Immune RPR:   NR HBsAg:   Neg HIV:   NR GBS:   Neg  Assessment/Plan: IUP at 39 weeks for cervical ripening and induction due to GDM.  A2GDM controlled with Metformin, has had reactive NSTs.  Will ripen with cytotec overnight and assess for further plan in the am.     Hal Norrington D 11/08/2016, 9:05 PM

## 2016-11-09 ENCOUNTER — Encounter (HOSPITAL_COMMUNITY): Payer: Self-pay | Admitting: Emergency Medicine

## 2016-11-09 ENCOUNTER — Inpatient Hospital Stay (HOSPITAL_COMMUNITY): Payer: BLUE CROSS/BLUE SHIELD | Admitting: Anesthesiology

## 2016-11-09 ENCOUNTER — Encounter (HOSPITAL_COMMUNITY)
Admission: AD | Disposition: A | Discharge status: Home / Self Care | Payer: Self-pay | Source: Ambulatory Visit | Attending: Obstetrics and Gynecology

## 2016-11-09 LAB — TYPE AND SCREEN
ABO/RH(D): A POS
ANTIBODY SCREEN: NEGATIVE

## 2016-11-09 LAB — GLUCOSE, CAPILLARY
GLUCOSE-CAPILLARY: 79 mg/dL (ref 65–99)
GLUCOSE-CAPILLARY: 62 mg/dL — AB (ref 65–99)
GLUCOSE-CAPILLARY: 73 mg/dL (ref 65–99)
Glucose-Capillary: 103 mg/dL — ABNORMAL HIGH (ref 65–99)
Glucose-Capillary: 113 mg/dL — ABNORMAL HIGH (ref 65–99)
Glucose-Capillary: 78 mg/dL (ref 65–99)
Glucose-Capillary: 79 mg/dL (ref 65–99)
Glucose-Capillary: 84 mg/dL (ref 65–99)
Glucose-Capillary: 90 mg/dL (ref 65–99)

## 2016-11-09 LAB — RPR: RPR Ser Ql: NONREACTIVE

## 2016-11-09 SURGERY — Surgical Case
Anesthesia: Epidural

## 2016-11-09 MED ORDER — FENTANYL 2.5 MCG/ML BUPIVACAINE 1/10 % EPIDURAL INFUSION (WH - ANES)
14.0000 mL/h | INTRAMUSCULAR | Status: DC | PRN
  Administered 2016-11-09 (×3): 14 mL/h via EPIDURAL
  Filled 2016-11-09 (×2): qty 100

## 2016-11-09 MED ORDER — LACTATED RINGERS IV SOLN
500.0000 mL | Freq: Once | INTRAVENOUS | Status: AC
  Administered 2016-11-09: 500 mL via INTRAVENOUS

## 2016-11-09 MED ORDER — LIDOCAINE HCL (PF) 1 % IJ SOLN
INTRAMUSCULAR | Status: DC | PRN
  Administered 2016-11-09 (×2): 5 mL via EPIDURAL

## 2016-11-09 MED ORDER — OXYTOCIN 10 UNIT/ML IJ SOLN
INTRAMUSCULAR | Status: AC
  Filled 2016-11-09: qty 4

## 2016-11-09 MED ORDER — OXYTOCIN 40 UNITS IN LACTATED RINGERS INFUSION - SIMPLE MED
1.0000 m[IU]/min | INTRAVENOUS | Status: DC
  Administered 2016-11-09: 2 m[IU]/min via INTRAVENOUS
  Filled 2016-11-09: qty 1000

## 2016-11-09 MED ORDER — MORPHINE SULFATE (PF) 0.5 MG/ML IJ SOLN
INTRAMUSCULAR | Status: AC
  Filled 2016-11-09: qty 10

## 2016-11-09 MED ORDER — SODIUM BICARBONATE 8.4 % IV SOLN
INTRAVENOUS | Status: DC | PRN
  Administered 2016-11-09: 4 mL via EPIDURAL
  Administered 2016-11-09 (×2): 5 mL via EPIDURAL

## 2016-11-09 MED ORDER — EPHEDRINE 5 MG/ML INJ: 10.0000 mg | INTRAVENOUS | Status: DC | PRN

## 2016-11-09 MED ORDER — PHENYLEPHRINE 40 MCG/ML (10ML) SYRINGE FOR IV PUSH (FOR BLOOD PRESSURE SUPPORT): 80.0000 ug | PREFILLED_SYRINGE | INTRAVENOUS | Status: DC | PRN

## 2016-11-09 MED ORDER — ONDANSETRON HCL 4 MG/2ML IJ SOLN
INTRAMUSCULAR | Status: AC
  Filled 2016-11-09: qty 2

## 2016-11-09 MED ORDER — CEFAZOLIN SODIUM-DEXTROSE 2-3 GM-% IV SOLR
INTRAVENOUS | Status: DC | PRN
  Administered 2016-11-09: 2 g via INTRAVENOUS

## 2016-11-09 MED ORDER — CEFAZOLIN SODIUM-DEXTROSE 2-4 GM/100ML-% IV SOLN
INTRAVENOUS | Status: AC
  Filled 2016-11-09: qty 100

## 2016-11-09 MED ORDER — SCOPOLAMINE 1 MG/3DAYS TD PT72
MEDICATED_PATCH | TRANSDERMAL | Status: AC
  Filled 2016-11-09: qty 1

## 2016-11-09 MED ORDER — DIPHENHYDRAMINE HCL 50 MG/ML IJ SOLN: 12.5000 mg | INTRAMUSCULAR | Status: DC | PRN

## 2016-11-09 MED ORDER — PHENYLEPHRINE 40 MCG/ML (10ML) SYRINGE FOR IV PUSH (FOR BLOOD PRESSURE SUPPORT)
80.0000 ug | PREFILLED_SYRINGE | INTRAVENOUS | Status: AC | PRN
  Administered 2016-11-09 – 2016-11-10 (×3): 80 ug via INTRAVENOUS
  Filled 2016-11-09: qty 10

## 2016-11-09 SURGICAL SUPPLY — 37 items
APL SKNCLS STERI-STRIP NONHPOA (GAUZE/BANDAGES/DRESSINGS) ×1
BENZOIN TINCTURE PRP APPL 2/3 (GAUZE/BANDAGES/DRESSINGS) ×2 IMPLANT
CHLORAPREP W/TINT 26ML (MISCELLANEOUS) ×3 IMPLANT
CLAMP CORD UMBIL (MISCELLANEOUS) IMPLANT
CLOSURE STERI-STRIP 1/2X4 (GAUZE/BANDAGES/DRESSINGS) ×1
CLOTH BEACON ORANGE TIMEOUT ST (SAFETY) ×3 IMPLANT
CLSR STERI-STRIP ANTIMIC 1/2X4 (GAUZE/BANDAGES/DRESSINGS) ×1 IMPLANT
CONTAINER PREFILL 10% NBF 15ML (MISCELLANEOUS) IMPLANT
DRSG OPSITE POSTOP 4X10 (GAUZE/BANDAGES/DRESSINGS) ×3 IMPLANT
ELECT REM PT RETURN 9FT ADLT (ELECTROSURGICAL) ×3
ELECTRODE REM PT RTRN 9FT ADLT (ELECTROSURGICAL) ×1 IMPLANT
EXTRACTOR VACUUM KIWI (MISCELLANEOUS) IMPLANT
EXTRACTOR VACUUM M CUP 4 TUBE (SUCTIONS) IMPLANT
EXTRACTOR VACUUM M CUP 4' TUBE (SUCTIONS)
GLOVE BIOGEL PI IND STRL 7.0 (GLOVE) ×1 IMPLANT
GLOVE BIOGEL PI INDICATOR 7.0 (GLOVE) ×2
GLOVE ORTHO TXT STRL SZ7.5 (GLOVE) ×3 IMPLANT
GOWN STRL REUS W/TWL LRG LVL3 (GOWN DISPOSABLE) ×6 IMPLANT
KIT ABG SYR 3ML LUER SLIP (SYRINGE) IMPLANT
NDL HYPO 25X5/8 SAFETYGLIDE (NEEDLE) ×1 IMPLANT
NEEDLE HYPO 25X5/8 SAFETYGLIDE (NEEDLE) ×3 IMPLANT
NS IRRIG 1000ML POUR BTL (IV SOLUTION) ×3 IMPLANT
PACK C SECTION WH (CUSTOM PROCEDURE TRAY) ×3 IMPLANT
PAD OB MATERNITY 4.3X12.25 (PERSONAL CARE ITEMS) ×3 IMPLANT
PENCIL SMOKE EVAC W/HOLSTER (ELECTROSURGICAL) ×3 IMPLANT
RTRCTR C-SECT PINK 25CM LRG (MISCELLANEOUS) ×3 IMPLANT
SUT CHROMIC 1 CTX 36 (SUTURE) ×6 IMPLANT
SUT PLAIN 0 NONE (SUTURE) ×2 IMPLANT
SUT PLAIN 2 0 XLH (SUTURE) ×2 IMPLANT
SUT VIC AB 0 CT1 27 (SUTURE) ×6
SUT VIC AB 0 CT1 27XBRD ANBCTR (SUTURE) ×2 IMPLANT
SUT VIC AB 2-0 CT1 (SUTURE) ×3 IMPLANT
SUT VIC AB 2-0 CT1 27 (SUTURE) ×3
SUT VIC AB 2-0 CT1 TAPERPNT 27 (SUTURE) ×1 IMPLANT
SUT VIC AB 4-0 KS 27 (SUTURE) IMPLANT
TOWEL OR 17X24 6PK STRL BLUE (TOWEL DISPOSABLE) ×3 IMPLANT
TRAY FOLEY CATH SILVER 14FR (SET/KITS/TRAYS/PACK) ×3 IMPLANT

## 2016-11-09 NOTE — Progress Notes (Signed)
Admitted for ripening last night, has received 3 doses of cytotec, feeling some ctx Afeb, VSS CBG-normal since a 170 on admission FHT- 130-140, Cat I, ctx q 2 min VE-2/50/-2, vtx Will start pitocin at 1000, discussed risks, monitor progress, change CBG to q 4 hours

## 2016-11-09 NOTE — Anesthesia Preprocedure Evaluation (Signed)
Anesthesia Evaluation  Patient identified by MRN, date of birth, ID band Patient awake    Reviewed: Allergy & Precautions, NPO status , Patient's Chart, lab work & pertinent test results  Airway Mallampati: II  TM Distance: >3 FB Neck ROM: Full    Dental no notable dental hx. (+) Dental Advisory Given   Pulmonary former smoker,    Pulmonary exam normal        Cardiovascular negative cardio ROS Normal cardiovascular exam     Neuro/Psych PSYCHIATRIC DISORDERS Anxiety negative neurological ROS  negative psych ROS   GI/Hepatic negative GI ROS, Neg liver ROS,   Endo/Other  diabetes  Renal/GU negative Renal ROS  negative genitourinary   Musculoskeletal negative musculoskeletal ROS (+)   Abdominal   Peds negative pediatric ROS (+)  Hematology negative hematology ROS (+)   Anesthesia Other Findings   Reproductive/Obstetrics (+) Pregnancy                             Anesthesia Physical Anesthesia Plan  ASA: II  Anesthesia Plan: Epidural   Post-op Pain Management:    Induction:   Airway Management Planned:   Additional Equipment:   Intra-op Plan:   Post-operative Plan:   Informed Consent: I have reviewed the patients History and Physical, chart, labs and discussed the procedure including the risks, benefits and alternatives for the proposed anesthesia with the patient or authorized representative who has indicated his/her understanding and acceptance.   Dental advisory given  Plan Discussed with: Anesthesiologist  Anesthesia Plan Comments:         Anesthesia Quick Evaluation

## 2016-11-09 NOTE — Progress Notes (Signed)
Comfortable with epidural Temp 100.6, VSS FHT- Cat II, 160s, mod variability, maybe some late decels, ctx q 2-3 min on 6 mu/min pitocin VE-per RN 5-6/80/-2, vtx Discussed minimal if any progress over the past 6 hours, low grade temp, increase in FHR baseline, pt requests c-section.  Discussed c-section procedure and risks, will proceed for arrest of dilation.

## 2016-11-09 NOTE — Progress Notes (Signed)
Comfortable with epidural Afeb, VSS FHT- 130-140, Cat I, ctx q 2 min on 2 mu/min pitocin VE-3/70/-2, vtx, AROM-clear Continue pitocin, monitor progress

## 2016-11-09 NOTE — Anesthesia Procedure Notes (Signed)
Epidural Patient location during procedure: OB Start time: 11/09/2016 11:45 AM End time: 11/09/2016 11:58 AM  Staffing Anesthesiologist: Duane Boston Performed: anesthesiologist   Preanesthetic Checklist Completed: patient identified, site marked, pre-op evaluation, timeout performed, IV checked, risks and benefits discussed and monitors and equipment checked  Epidural Patient position: sitting Prep: DuraPrep Patient monitoring: heart rate, cardiac monitor, continuous pulse ox and blood pressure Approach: midline Location: L2-L3 Injection technique: LOR saline  Needle:  Needle type: Tuohy  Needle gauge: 17 G Needle length: 9 cm Needle insertion depth: 6 cm Catheter size: 20 Guage Catheter at skin depth: 11 cm Test dose: negative and Other  Assessment Events: blood not aspirated, injection not painful, no injection resistance and negative IV test  Additional Notes Informed consent obtained prior to proceeding including risk of failure, 1% risk of PDPH, risk of minor discomfort and bruising.  Discussed rare but serious complications including epidural abscess, permanent nerve injury, epidural hematoma.  Discussed alternatives to epidural analgesia and patient desires to proceed.  Timeout performed pre-procedure verifying patient name, procedure, and platelet count.  Patient tolerated procedure well.

## 2016-11-09 NOTE — Anesthesia Pain Management Evaluation Note (Signed)
  CRNA Pain Management Visit Note  Patient: Zoe Wiley, 31 y.o., female  "Hello I am a member of the anesthesia team at [facility]. We have an anesthesia team available at all times to provide care throughout the hospital, including epidural management and anesthesia for C-section. I don't know your plan for the delivery whether it a natural birth, water birth, IV sedation, nitrous supplementation, doula or epidural, but we want to meet your pain goals."   1.Was your pain managed to your expectations on prior hospitalizations?   No prior hospitalizations  2.What is your expectation for pain management during this hospitalization?     Epidural, IV pain meds and Nitrous Oxide  3.How can we help you reach that goal? Not sure  Record the patient's initial score and the patient's pain goal.   Pain: 1  Pain Goal: 7 [facility] wants you to be able to say your pain was always managed very well.  Albertha Beattie 11/09/2016

## 2016-11-09 NOTE — Progress Notes (Signed)
Comfortable with epidural Afeb, VSS FHT- 130-140, recent variable decels with position change, mod variability, Cat II, ctx q 2-3 min on 4 mu/min pitocin VE-5/70/-2, vtx Continue pitocin, monitor progress

## 2016-11-09 NOTE — Progress Notes (Signed)
Remote review of FHT 150-160, mod variability, no accels, she has had a few decels-difficult to tell if late or variable, Cat II, ctx q 2-3 min on 6 mu/min pitocin. VE unchanged per RN Will give her 2 more hours to see if cervix changes as long as FHT does not get any worse.

## 2016-11-10 ENCOUNTER — Encounter (HOSPITAL_COMMUNITY): Payer: Self-pay | Admitting: General Practice

## 2016-11-10 LAB — CBC
HCT: 26 % — ABNORMAL LOW (ref 36.0–46.0)
HEMOGLOBIN: 9.4 g/dL — AB (ref 12.0–15.0)
MCH: 31.8 pg (ref 26.0–34.0)
MCHC: 36.2 g/dL — AB (ref 30.0–36.0)
MCV: 87.8 fL (ref 78.0–100.0)
Platelets: 150 10*3/uL (ref 150–400)
RBC: 2.96 MIL/uL — AB (ref 3.87–5.11)
RDW: 13.8 % (ref 11.5–15.5)
WBC: 20 10*3/uL — ABNORMAL HIGH (ref 4.0–10.5)

## 2016-11-10 LAB — GLUCOSE, CAPILLARY: Glucose-Capillary: 119 mg/dL — ABNORMAL HIGH (ref 65–99)

## 2016-11-10 MED ORDER — SODIUM CHLORIDE 0.9% FLUSH: 3.0000 mL | INTRAVENOUS | Status: DC | PRN

## 2016-11-10 MED ORDER — DIPHENHYDRAMINE HCL 25 MG PO CAPS: 25.0000 mg | ORAL_CAPSULE | Freq: Four times a day (QID) | ORAL | Status: DC | PRN

## 2016-11-10 MED ORDER — ZOLPIDEM TARTRATE 5 MG PO TABS: 5.0000 mg | ORAL_TABLET | Freq: Every evening | ORAL | Status: DC | PRN

## 2016-11-10 MED ORDER — KETOROLAC TROMETHAMINE 30 MG/ML IJ SOLN: 30.0000 mg | Freq: Four times a day (QID) | INTRAMUSCULAR | Status: AC | PRN

## 2016-11-10 MED ORDER — OXYTOCIN 40 UNITS IN LACTATED RINGERS INFUSION - SIMPLE MED: 2.5000 [IU]/h | INTRAVENOUS | Status: AC

## 2016-11-10 MED ORDER — METOCLOPRAMIDE HCL 5 MG/ML IJ SOLN: 10.0000 mg | Freq: Once | INTRAMUSCULAR | Status: DC | PRN

## 2016-11-10 MED ORDER — PRENATAL MULTIVITAMIN CH
1.0000 | ORAL_TABLET | Freq: Every day | ORAL | Status: DC
  Administered 2016-11-10 – 2016-11-11 (×2): 1 via ORAL
  Filled 2016-11-10 (×2): qty 1

## 2016-11-10 MED ORDER — COCONUT OIL OIL
1.0000 "application " | TOPICAL_OIL | Status: DC | PRN
  Administered 2016-11-11: 1 via TOPICAL
  Filled 2016-11-10: qty 120

## 2016-11-10 MED ORDER — MEASLES, MUMPS & RUBELLA VAC ~~LOC~~ INJ
0.5000 mL | INJECTION | Freq: Once | SUBCUTANEOUS | Status: DC
  Filled 2016-11-10: qty 0.5

## 2016-11-10 MED ORDER — MORPHINE SULFATE (PF) 0.5 MG/ML IJ SOLN
INTRAMUSCULAR | Status: DC | PRN
  Administered 2016-11-10: 4 mg via EPIDURAL
  Administered 2016-11-10: 1 mg via INTRAVENOUS

## 2016-11-10 MED ORDER — OXYCODONE HCL 5 MG PO TABS
5.0000 mg | ORAL_TABLET | ORAL | Status: DC | PRN
  Administered 2016-11-10 – 2016-11-11 (×3): 5 mg via ORAL
  Filled 2016-11-10 (×3): qty 1

## 2016-11-10 MED ORDER — OXYCODONE HCL 5 MG PO TABS: 10.0000 mg | ORAL_TABLET | ORAL | Status: DC | PRN

## 2016-11-10 MED ORDER — MENTHOL 3 MG MT LOZG: 1.0000 | LOZENGE | OROMUCOSAL | Status: DC | PRN

## 2016-11-10 MED ORDER — SIMETHICONE 80 MG PO CHEW
80.0000 mg | CHEWABLE_TABLET | ORAL | Status: DC | PRN
  Administered 2016-11-11: 80 mg via ORAL
  Filled 2016-11-10: qty 1

## 2016-11-10 MED ORDER — ACETAMINOPHEN 325 MG PO TABS
650.0000 mg | ORAL_TABLET | ORAL | Status: DC | PRN
  Administered 2016-11-10: 650 mg via ORAL
  Filled 2016-11-10: qty 2

## 2016-11-10 MED ORDER — NALBUPHINE HCL 10 MG/ML IJ SOLN: 5.0000 mg | Freq: Once | INTRAMUSCULAR | Status: DC | PRN

## 2016-11-10 MED ORDER — NALBUPHINE HCL 10 MG/ML IJ SOLN: 5.0000 mg | INTRAMUSCULAR | Status: DC | PRN

## 2016-11-10 MED ORDER — SERTRALINE HCL 25 MG PO TABS
125.0000 mg | ORAL_TABLET | Freq: Every day | ORAL | Status: DC
  Administered 2016-11-11: 09:00:00 125 mg via ORAL
  Filled 2016-11-10 (×3): qty 1

## 2016-11-10 MED ORDER — SERTRALINE HCL 100 MG PO TABS
100.0000 mg | ORAL_TABLET | Freq: Every day | ORAL | Status: DC
  Administered 2016-11-10: 100 mg via ORAL
  Filled 2016-11-10: qty 1
  Filled 2016-11-10: qty 2

## 2016-11-10 MED ORDER — NALOXONE HCL 2 MG/2ML IJ SOSY
1.0000 ug/kg/h | PREFILLED_SYRINGE | INTRAVENOUS | Status: DC | PRN
  Filled 2016-11-10: qty 2

## 2016-11-10 MED ORDER — DIPHENHYDRAMINE HCL 25 MG PO CAPS: 25.0000 mg | ORAL_CAPSULE | ORAL | Status: DC | PRN

## 2016-11-10 MED ORDER — DIPHENHYDRAMINE HCL 50 MG/ML IJ SOLN: 12.5000 mg | INTRAMUSCULAR | Status: DC | PRN

## 2016-11-10 MED ORDER — LACTATED RINGERS IV SOLN
INTRAVENOUS | Status: DC
  Administered 2016-11-10: 09:00:00 via INTRAVENOUS

## 2016-11-10 MED ORDER — MAGNESIUM HYDROXIDE 400 MG/5ML PO SUSP: 30.0000 mL | ORAL | Status: DC | PRN

## 2016-11-10 MED ORDER — LACTATED RINGERS IV SOLN
INTRAVENOUS | Status: DC | PRN
  Administered 2016-11-10: via INTRAVENOUS

## 2016-11-10 MED ORDER — ONDANSETRON HCL 4 MG/2ML IJ SOLN
INTRAMUSCULAR | Status: DC | PRN
  Administered 2016-11-10: 4 mg via INTRAVENOUS

## 2016-11-10 MED ORDER — NALOXONE HCL 0.4 MG/ML IJ SOLN: 0.4000 mg | INTRAMUSCULAR | Status: DC | PRN

## 2016-11-10 MED ORDER — SCOPOLAMINE 1 MG/3DAYS TD PT72
MEDICATED_PATCH | TRANSDERMAL | Status: DC | PRN
  Administered 2016-11-10: 1 via TRANSDERMAL

## 2016-11-10 MED ORDER — SODIUM CHLORIDE 0.9 % IR SOLN
Status: DC | PRN
  Administered 2016-11-10: 1

## 2016-11-10 MED ORDER — SERTRALINE HCL 25 MG PO TABS
25.0000 mg | ORAL_TABLET | Freq: Every day | ORAL | Status: DC
  Administered 2016-11-10: 25 mg via ORAL
  Filled 2016-11-10 (×2): qty 1

## 2016-11-10 MED ORDER — FENTANYL CITRATE (PF) 100 MCG/2ML IJ SOLN: 25.0000 ug | INTRAMUSCULAR | Status: DC | PRN

## 2016-11-10 MED ORDER — TETANUS-DIPHTH-ACELL PERTUSSIS 5-2.5-18.5 LF-MCG/0.5 IM SUSP: 0.5000 mL | Freq: Once | INTRAMUSCULAR | Status: DC

## 2016-11-10 MED ORDER — WITCH HAZEL-GLYCERIN EX PADS: 1.0000 "application " | MEDICATED_PAD | CUTANEOUS | Status: DC | PRN

## 2016-11-10 MED ORDER — IBUPROFEN 600 MG PO TABS
600.0000 mg | ORAL_TABLET | Freq: Four times a day (QID) | ORAL | Status: DC
  Administered 2016-11-10 – 2016-11-11 (×6): 600 mg via ORAL
  Filled 2016-11-10 (×6): qty 1

## 2016-11-10 MED ORDER — MEPERIDINE HCL 25 MG/ML IJ SOLN: 6.2500 mg | INTRAMUSCULAR | Status: DC | PRN

## 2016-11-10 MED ORDER — LIDOCAINE-EPINEPHRINE (PF) 2 %-1:200000 IJ SOLN
INTRAMUSCULAR | Status: AC
Start: 2016-11-10 — End: 2016-11-10
  Filled 2016-11-10: qty 20

## 2016-11-10 MED ORDER — SENNOSIDES-DOCUSATE SODIUM 8.6-50 MG PO TABS
2.0000 | ORAL_TABLET | ORAL | Status: DC
  Administered 2016-11-10: 2 via ORAL
  Filled 2016-11-10: qty 2

## 2016-11-10 MED ORDER — DIBUCAINE 1 % RE OINT: 1.0000 "application " | TOPICAL_OINTMENT | RECTAL | Status: DC | PRN

## 2016-11-10 MED ORDER — ONDANSETRON HCL 4 MG/2ML IJ SOLN: 4.0000 mg | Freq: Three times a day (TID) | INTRAMUSCULAR | Status: DC | PRN

## 2016-11-10 MED ORDER — PHENYLEPHRINE 40 MCG/ML (10ML) SYRINGE FOR IV PUSH (FOR BLOOD PRESSURE SUPPORT)
PREFILLED_SYRINGE | INTRAVENOUS | Status: AC
  Filled 2016-11-10: qty 10

## 2016-11-10 MED ORDER — SCOPOLAMINE 1 MG/3DAYS TD PT72
1.0000 | MEDICATED_PATCH | Freq: Once | TRANSDERMAL | Status: DC
  Filled 2016-11-10: qty 1

## 2016-11-10 MED ORDER — LACTATED RINGERS IV SOLN: INTRAVENOUS | Status: DC

## 2016-11-10 MED ORDER — OXYTOCIN 10 UNIT/ML IJ SOLN
INTRAVENOUS | Status: DC | PRN
  Administered 2016-11-10: 40 [IU] via INTRAVENOUS

## 2016-11-10 NOTE — Op Note (Signed)
Preoperative diagnosis: Intrauterine pregnancy at 39 weeks, Gestational diabetes, arrest of dilation Postoperative diagnosis: Same Procedure: Primary low transverse cesarean section without extensions Surgeon: Cheri Fowler M.D. Anesthesia: Epidural Findings: Patient had normal gravid anatomy and delivered a viable female infant with Apgars of 9 and 9 weight pending Estimated blood loss: 1000 cc Specimens: Placenta sent for routine pathology Complications: None  Procedure in detail: The patient was taken to the operating room and placed in the dorsosupine position. Abdomen was then prepped and draped in the usual sterile fashion, a foley catheter had previously been inserted. The level of her anesthesia was found to be adequate. Abdomen was entered via a standard Pfannenstiel incision. Once the peritoneal cavity was entered the Alexis disposable self-retaining retractor was placed and good visualization was achieved. A 4 cm transverse incision was then made in the lower uterine segment pushing the bladder inferior. Once the uterine cavity was entered the incision was extended digitally. The fetal vertex was grasped and delivered through the incision atraumatically. Mouth and nares were suctioned. The remainder of the infant then delivered atraumatically. Cord was doubly clamped and cut and the infant handed to the awaiting pediatric team. Cord blood was obtained. The placenta delivered spontaneously. Uterus was wiped dry with clean lap pad and all clots and debris were removed. Uterine incision was inspected and found to be free of extensions. Uterine incision was closed in 2 layers with running locking #1 Chromic for the first layer and a second imbricating layer also with #1 Chromic. Tubes and ovaries were inspected and found to be normal. Uterine incision was inspected and found to be hemostatic. Bleeding from serosal edges was controlled with electrocautery. The Alexis retractor was removed.  Subfascial space was irrigated and made hemostatic with electrocautery. Peritoneum was closed with 2-0 Vicryl.  Fascia was closed in running fashion starting at both ends and meeting in the middle with 0 Vicryl. Subcutaneous tissue was then irrigated and made hemostatic with electrocautery, then closed with running 2-0 plain gut. Skin was closed with running 4-0 Vicryl subcuticular suture followed by steri-strips and a sterile dressing. Patient tolerated the procedure well and was taken to the recovery in stable condition. Counts were correct x2, she received Ancef 2 g IV at the beginning of the procedure and she had PAS hose on throughout the procedure.

## 2016-11-10 NOTE — Lactation Note (Signed)
This note was copied from a baby's chart. Lactation Consultation Note  Baby was giving subtle feeding cues so she was placed skin to skin on her mother.She stopped cueing and fell asleep. Mother taught hand expression and colostrum was easily expressed.  It was applied to the baby's lips but the baby pursed her lips and acted disinterested in it.  Mom is exhausted and is having difficulty keeping her eyes open. Encouraged parents to delay visitors so mother could rest while baby rested. There was a pacifier in the room. Teaching regarding pacifiers completed. Understanding verbalized by FOB. Follow-up later today.  Patient Name: Zoe Wiley [id number] Date: 11/10/2016 Reason for consult: Initial assessment   Maternal Data    Feeding Feeding Type: Breast Fed Length of feed: 0 min  LATCH Score/Interventions Latch: Too sleepy or reluctant, no latch achieved, no sucking elicited.  Audible Swallowing: None  Type of Nipple: Everted at rest and after stimulation (positional stripe on left nipple)  Comfort (Breast/Nipple): Soft / non-tender     Hold (Positioning): Assistance needed to correctly position infant at breast and maintain latch.  LATCH Score: 5  Lactation Tools Discussed/Used     Consult Status Consult Status: Follow-up Date: 11/10/16 Follow-up type: In-patient    Van Clines 11/10/2016, 11:16 AM

## 2016-11-10 NOTE — Transfer of Care (Signed)
Immediate Anesthesia Transfer of Care Note  Patient: Zoe Wiley  Procedure(s) Performed: Procedure(s): CESAREAN SECTION (N/A)  Patient Location: PACU  Anesthesia Type:Epidural  Level of Consciousness: awake, alert , oriented and patient cooperative  Airway & Oxygen Therapy: Patient Spontanous Breathing  Post-op Assessment: Post -op Vital signs reviewed and stable  Post vital signs: Reviewed and stable  Last Vitals:  Vitals:   11/10/16 0117 11/10/16 0118  BP:    Pulse: 86 86  Resp: 19 19  Temp:      Last Pain:  Vitals:   11/09/16 2257  TempSrc: Oral  PainSc:          Complications: No apparent anesthesia complications

## 2016-11-10 NOTE — Progress Notes (Signed)
Subjective: Postpartum Day 1: Cesarean Delivery Patient reports tolerating PO, + flatus and no problems voiding.  Bonding well with baby. Pain well controlled. Lochia mild. Has no complaints  Objective: Vital signs in last 24 hours: Temp:  [97.8 F (36.6 C)-100.6 F (38.1 C)] 98.8 F (37.1 C) (01/30 0530) Pulse Rate:  [67-100] 88 (01/30 0645) Resp:  [15-21] 18 (01/30 0645) BP: (96-146)/(57-101) 105/59 (01/30 0645) SpO2:  [94 %-100 %] 97 % (01/30 0700)  Physical Exam:  General: alert, cooperative and no distress Lochia: appropriate Uterine Fundus: firm Incision: dressing c/d/i; soft abdomen DVT Evaluation: Negative Homan's sign.   Recent Labs  11/08/16 2129 11/10/16 0423  HGB 12.3 9.4*  HCT 36.0 26.0*    Assessment/Plan: Status post Cesarean section. Doing well postoperatively.  Continue current care.  [place] 11/10/2016, 9:46 AM

## 2016-11-10 NOTE — Anesthesia Postprocedure Evaluation (Signed)
Anesthesia Post Note  Patient: Zoe Wiley  Procedure(s) Performed: Procedure(s) (LRB): CESAREAN SECTION (N/A)  Patient location during evaluation: Mother Baby Anesthesia Type: Epidural Level of consciousness: awake and alert Pain management: pain level controlled Vital Signs Assessment: post-procedure vital signs reviewed and stable Respiratory status: spontaneous breathing and nonlabored ventilation Cardiovascular status: stable Postop Assessment: no headache, patient able to bend at knees, no backache, no signs of nausea or vomiting, epidural receding and adequate PO intake Anesthetic complications: no        Last Vitals:  Vitals:   11/10/16 0530 11/10/16 0645  BP: (!) 111/57 (!) 105/59  Pulse: 72 88  Resp: 18 18  Temp: 37.1 C     Last Pain:  Vitals:   11/10/16 0645  TempSrc:   PainSc: 0-No pain   Pain Goal:                 [employer]

## 2016-11-10 NOTE — Progress Notes (Signed)
MOB was referred for history of depression/anxiety. * Referral screened out by Clinical Social Worker because none of the following criteria appear to apply: ~ History of anxiety/depression during this pregnancy, or of post-partum depression. ~ Diagnosis of anxiety and/or depression within last 3 years OR * MOB's symptoms currently being treated with medication and/or therapy. Please contact the Clinical Social Worker if needs arise, or if MOB requests.  Rx Zoloft

## 2016-11-10 NOTE — Lactation Note (Signed)
This note was copied from a baby's chart. Lactation Consultation Note  Assisted mother with latching baby. Showed her how to use a teacup hold to obtain a deeper latch which resulted in less nipple compression on the left breast. Baby had difficulty maintaining latch on the right breast related to areolar edema that contributed to nipple inversion. Manual pump helped with eversion.  Mom to be given shells for that side. Mom taught breast compression to keep the baby engaged.  Encouraged mom to work through the challenges and call for assistance as needed. Follow-up tomorrow.  Patient Name: [name] Today's Date: 11/10/2016     Maternal Data Has patient been taught Hand Expression?: Yes  Feeding Feeding Type: Breast Fed Length of feed:  (observed 20 min)  LATCH Score/Interventions Latch: Repeated attempts needed to sustain latch, nipple held in mouth throughout feeding, stimulation needed to elicit sucking reflex.  Audible Swallowing: A few with stimulation  Type of Nipple: Everted at rest and after stimulation  Comfort (Breast/Nipple): Soft / non-tender     Hold (Positioning): Assistance needed to correctly position infant at breast and maintain latch.  LATCH Score: 7  Lactation Tools Discussed/Used     Consult Status Consult Status: Follow-up Date: 11/11/16 Follow-up type: In-patient    Van Clines 11/10/2016, 2:46 PM

## 2016-11-11 ENCOUNTER — Encounter (HOSPITAL_COMMUNITY): Payer: Self-pay | Admitting: Obstetrics and Gynecology

## 2016-11-11 MED ORDER — PRENATAL MULTIVITAMIN CH: 1.0000 | ORAL_TABLET | Freq: Every day | ORAL | 3 refills | Status: DC

## 2016-11-11 MED ORDER — OXYCODONE HCL 5 MG PO TABS: ORAL_TABLET | ORAL | 0 refills | Status: DC

## 2016-11-11 MED ORDER — IBUPROFEN 800 MG PO TABS: 800.0000 mg | ORAL_TABLET | Freq: Three times a day (TID) | ORAL | 1 refills | Status: DC | PRN

## 2016-11-11 NOTE — Progress Notes (Addendum)
Subjective: Postpartum Day 1/2: (Pt delivered shortly after MN) Cesarean Delivery Patient reports incisional pain and tolerating PO.  Pain controlled, nl lochia.  Desires d/c to home - d/w pt up to pediatrics, but clear from my standpoint,.  Objective: Vital signs in last 24 hours: Temp:  [97.5 F (36.4 C)-98.9 F (37.2 C)] 98.4 F (36.9 C) (01/31 0500) Pulse Rate:  [70-74] 71 (01/31 0500) Resp:  [18-20] 18 (01/31 0500) BP: (99-119)/(42-50) 115/50 (01/31 0500) SpO2:  [98 %] 98 % (01/30 1040)  Physical Exam:  General: alert and no distress Lochia: appropriate Uterine Fundus: firm Incision: healing well DVT Evaluation: No evidence of DVT seen on physical exam.   Recent Labs  11/08/16 2129 11/10/16 0423  HGB 12.3 9.4*  HCT 36.0 26.0*    Assessment/Plan: Status post Cesarean section. Doing well postoperatively.  Continue current care.  D/C with motrin and oxycodone, f/u 2 weeks  Bovard-Stuckert, Hadriel Northup 11/11/2016, 8:39 AM

## 2016-11-11 NOTE — Discharge Summary (Signed)
OB Discharge Summary     Patient Name: Zoe Wiley DOB: Jan 09, 1986 MRN: [id number]  Date of admission: 11/08/2016 Delivering MD: Willis Modena, TODD   Date of discharge: 11/11/2016  Admitting diagnosis: 39 WKS, INDUCTION Intrauterine pregnancy: [redacted]w[redacted]d     Secondary diagnosis:  Active Problems:   Gestational diabetes mellitus (GDM) controlled on oral hypoglycemic drug   Arrest of dilation, delivered, current hospitalization  Additional problems: N/A     Discharge diagnosis: Term Pregnancy Delivered and GDM A2                                                                                                Post partum procedures:none  Augmentation: AROM and Pitocin  Complications: None  Hospital course:  Induction of Labor With Cesarean Section  31 y.o. yo [id number] at [redacted]w[redacted]d was admitted to the hospital 11/08/2016 for induction of labor. Patient had a labor course significant for fever and arrest of dilation with some late decels. The patient went for cesarean section due to Arrest of Dilation, and delivered a Viable infant,@BABYSUPPRESS (DBLINK,ept,110,,1,,) Membrane Rupture Time/Date: )1:15 PM ,11/09/2016   @Details  of operation can be found in separate operative Note.  Patient had an uncomplicated postpartum course. She is ambulating, tolerating a regular diet, passing flatus, and urinating well.  Patient is discharged home in stable condition on 11/11/16.                                    Physical exam  Vitals:   11/10/16 1040 11/10/16 1633 11/10/16 2213 11/11/16 0500  BP: (!) 99/47 (!) 104/49 (!) 119/42 (!) 115/50  Pulse: 70 73 74 71  Resp: 20 20 18 18   Temp: 97.5 F (36.4 C) 98.6 F (37 C) 98.9 F (37.2 C) 98.4 F (36.9 C)  TempSrc: Oral Oral Oral Oral  SpO2: 98%     Weight:      Height:       General: alert and no distress Lochia: appropriate Uterine Fundus: firm Incision: Healing well with no significant drainage DVT Evaluation: No evidence of DVT seen on physical  exam. Labs: Lab Results  Component Value Date   WBC 20.0 (H) 11/10/2016   HGB 9.4 (L) 11/10/2016   HCT 26.0 (L) 11/10/2016   MCV 87.8 11/10/2016   PLT 150 11/10/2016   CMP Latest Ref Rng & Units 12/21/2014  Glucose 70 - 99 mg/dL 83  BUN 6 - 23 mg/dL 10  Creatinine 0.50 - 1.10 mg/dL 0.68  Sodium 135 - 145 mEq/L 137  Potassium 3.5 - 5.3 mEq/L 4.3  Chloride 96 - 112 mEq/L 106  CO2 19 - 32 mEq/L 25  Calcium 8.4 - 10.5 mg/dL 8.9  Total Protein 6.0 - 8.3 g/dL 6.6  Total Bilirubin 0.2 - 1.2 mg/dL 0.5  Alkaline Phos 39 - 117 U/L 34(L)  AST 0 - 37 U/L 13  ALT 0 - 35 U/L 12    Discharge instruction: per After Visit Summary and "Baby and Me Booklet".  After visit meds:  Allergies as  of 11/11/2016   No Known Allergies     Medication List    STOP taking these medications   metFORMIN 500 MG tablet Commonly known as:  GLUCOPHAGE     TAKE these medications   ibuprofen 800 MG tablet Commonly known as:  ADVIL,MOTRIN Take 1 tablet (800 mg total) by mouth every 8 (eight) hours as needed.   IRON PO Take 1 tablet by mouth at bedtime.   oxyCODONE 5 MG immediate release tablet Commonly known as:  Oxy IR/ROXICODONE 1-2 po q 6 hr prn severe pain   prenatal multivitamin Tabs tablet Take 1 tablet by mouth at bedtime.   sertraline 25 MG tablet Commonly known as:  ZOLOFT TAKE 1 TABLET BY MOUTH DAILY(IN ADDITION TO 100MG  TABLET)   sertraline 100 MG tablet Commonly known as:  ZOLOFT TAKE 1 TABLET BY MOUTH DAILY(IN ADDITION TO 25 MG DOSE)       Diet: routine diet  Activity: Advance as tolerated. Pelvic rest for 6 weeks.   Outpatient follow up:2 weeks Follow up Appt:No future appointments. Follow up Visit:No Follow-up on file.  Postpartum contraception: Not Discussed  Newborn Data: Live born female  Birth Weight: 8 lb 1.1 oz (3660 g) APGAR: 8, 9  Baby Feeding: Breast Disposition:home with mother   11/11/2016 Janyth Contes, MD

## 2016-11-11 NOTE — Lactation Note (Signed)
This note was copied from a baby's chart. Lactation Consultation Note  Patient Name: Zoe Wiley [id number] Date: 11/11/2016 Reason for consult: Follow-up assessment;Breast/nipple pain Mom called for assist with latch. Left nipple red with bruising, blister end of nipple, right nipple red with blister present. Assisted Mom in football hold on left breast, demonstrated using breast compression and reviewed importance of supporting breast to help baby sustain good depth. Baby sleepy at breast but demonstrated some good suckling bursts, swallowing motions observed. Mom's nipple was round when baby came off the breast. Mom reports nipple pain is improving and she feels baby is latching better. Assisted Mom with latch on right breast, this nipple has shorter shaft than left. Mom pre-pumped to help with latch, demonstrated to parents how to un-tuck lips. Baby has short, thick labial frenulum and tucks upper lip off/on. Care for sore nipples reviewed with Mom. Advised to apply EBM, she has coconut oil to use prn along with soothing gels she brought from home. Advised to alternate positions each feeding to help nipples heal, pre-pump as needed to help with latch, use breast compression to help baby obtain good depth. Advised baby should be at breast 8-12 times or more in 24 hours and with feeding ques. Cluster feeding discussed. OP f/u with lactation scheduled for Friday, 11/13/16 at 2:30.  Maternal Data    Feeding Feeding Type: Breast Fed Length of feed: 25 min  LATCH Score/Interventions Latch: Grasps breast easily, tongue down, lips flanged, rhythmical sucking. Intervention(s): Adjust position;Assist with latch;Breast massage;Breast compression  Audible Swallowing: A few with stimulation  Type of Nipple: Everted at rest and after stimulation (short nipple shafts bilateral)  Comfort (Breast/Nipple): Filling, red/small blisters or bruises, mild/mod discomfort  Problem noted: Cracked, bleeding,  blisters, bruises;Mild/Moderate discomfort Interventions  (Cracked/bleeding/bruising/blister): Expressed breast milk to nipple Interventions (Mild/moderate discomfort): Pre-pump if needed;Post-pump  Hold (Positioning): Assistance needed to correctly position infant at breast and maintain latch. Intervention(s): Breastfeeding basics reviewed;Support Pillows;Position options;Skin to skin  LATCH Score: 7  Lactation Tools Discussed/Used Tools: Pump Breast pump type: Double-Electric Breast Pump   Consult Status Consult Status: Complete Date: 11/11/16 Follow-up type: In-patient    Katrine Coho 11/11/2016, 2:26 PM

## 2016-11-13 ENCOUNTER — Ambulatory Visit (HOSPITAL_COMMUNITY): Payer: BLUE CROSS/BLUE SHIELD

## 2017-04-22 NOTE — Anesthesia Postprocedure Evaluation (Signed)
Anesthesia Post Note  Patient: Zoe Wiley  Procedure(s) Performed: Procedure(s) (LRB): CESAREAN SECTION (N/A)     Anesthesia Post Evaluation  Last Vitals:  Vitals:   11/10/16 2213 11/11/16 0500  BP: (!) 119/42 (!) 115/50  Pulse: 74 71  Resp: 18 18  Temp: 37.2 C 36.9 C    Last Pain:  Vitals:   11/11/16 1600  TempSrc:   PainSc: 3                  Montez Hageman

## 2017-04-22 NOTE — Addendum Note (Signed)
Addendum  created 04/22/17 1354 by Montez Hageman, MD   Sign clinical note

## 2017-12-02 ENCOUNTER — Encounter (INDEPENDENT_AMBULATORY_CARE_PROVIDER_SITE_OTHER): Payer: Self-pay

## 2017-12-02 ENCOUNTER — Ambulatory Visit (INDEPENDENT_AMBULATORY_CARE_PROVIDER_SITE_OTHER): Payer: BLUE CROSS/BLUE SHIELD | Admitting: Physician Assistant

## 2017-12-02 ENCOUNTER — Encounter: Payer: Self-pay | Admitting: Physician Assistant

## 2017-12-02 ENCOUNTER — Telehealth: Payer: Self-pay | Admitting: Physician Assistant

## 2017-12-02 VITALS — BP 120/77 | HR 72 | Wt 161.0 lb

## 2017-12-02 DIAGNOSIS — F411 Generalized anxiety disorder: Secondary | ICD-10-CM

## 2017-12-02 DIAGNOSIS — Z13220 Encounter for screening for lipoid disorders: Secondary | ICD-10-CM

## 2017-12-02 DIAGNOSIS — Z8632 Personal history of gestational diabetes: Secondary | ICD-10-CM | POA: Insufficient documentation

## 2017-12-02 DIAGNOSIS — F909 Attention-deficit hyperactivity disorder, unspecified type: Secondary | ICD-10-CM | POA: Diagnosis not present

## 2017-12-02 DIAGNOSIS — Z8759 Personal history of other complications of pregnancy, childbirth and the puerperium: Secondary | ICD-10-CM | POA: Insufficient documentation

## 2017-12-02 DIAGNOSIS — R87612 Low grade squamous intraepithelial lesion on cytologic smear of cervix (LGSIL): Secondary | ICD-10-CM | POA: Diagnosis not present

## 2017-12-02 DIAGNOSIS — F418 Other specified anxiety disorders: Secondary | ICD-10-CM

## 2017-12-02 DIAGNOSIS — Z7689 Persons encountering health services in other specified circumstances: Secondary | ICD-10-CM | POA: Diagnosis not present

## 2017-12-02 DIAGNOSIS — Z130 Encounter for screening for diseases of the blood and blood-forming organs and certain disorders involving the immune mechanism: Secondary | ICD-10-CM

## 2017-12-02 DIAGNOSIS — Z87891 Personal history of nicotine dependence: Secondary | ICD-10-CM

## 2017-12-02 HISTORY — DX: Low grade squamous intraepithelial lesion on cytologic smear of cervix (LGSIL): R87.612

## 2017-12-02 HISTORY — DX: Personal history of gestational diabetes: Z86.32

## 2017-12-02 HISTORY — DX: Personal history of other complications of pregnancy, childbirth and the puerperium: Z87.59

## 2017-12-02 HISTORY — DX: Personal history of nicotine dependence: Z87.891

## 2017-12-02 MED ORDER — SERTRALINE HCL 25 MG PO TABS: 25.0000 mg | ORAL_TABLET | Freq: Every day | ORAL | 1 refills | Status: DC

## 2017-12-02 MED ORDER — SERTRALINE HCL 100 MG PO TABS: 100.0000 mg | ORAL_TABLET | Freq: Every day | ORAL | 1 refills | Status: DC

## 2017-12-02 MED ORDER — AMPHETAMINE-DEXTROAMPHETAMINE 20 MG PO TABS: 20.0000 mg | ORAL_TABLET | Freq: Two times a day (BID) | ORAL | 0 refills | Status: DC

## 2017-12-02 NOTE — Telephone Encounter (Signed)
Received fax that Adderall needed PA and it was sent on Covermymeds. Awaiting determination.

## 2017-12-02 NOTE — Progress Notes (Signed)
HPI:                                                                Zoe Wiley is a 32 y.o. female who presents to [facility] today to establish care  Current concerns include: medication refills  This is a pleasant 32 yo F with PMH GAD, depression, gestational diabetes and ADHD. She has a 23 year old daughter named Alinda Sierras.   Depression/Anxiety: taking Sertraline 125 mg daily without difficulty. Reports she has been on this regimen, including her pregnancy, for the last 2-3 years. Developed depression and anxiety in college and was treated by a psychiatrist. No history of self-harm or hospitalizations. Denies depressed mood or anhedonia. Denies symptoms of mania/hypomania. Denies suicidal thinking. Denies auditory/visual hallucinations.  ADHD: diagnosed in childhood, but mother chose not to medicate. She started Adderall when she was in college. She discontinued it about 1.5 years ago for pregnancy. She would like to re-start. Currently working full-time at [employer] and struggles with inattention.   Depression screen [facility] 2/9 12/02/2017 07/27/2015 01/03/2014  Decreased Interest 0 0 0  Down, Depressed, Hopeless 0 0 0  PHQ - 2 Score 0 0 0  Altered sleeping 1 - -  Tired, decreased energy 0 - -  Change in appetite 0 - -  Feeling bad or failure about yourself  0 - -  Trouble concentrating 3 - -  Moving slowly or fidgety/restless 2 - -  Suicidal thoughts 0 - -  PHQ-9 Score 6 - -  Difficult doing work/chores Not difficult at all - -    GAD 7 : Generalized Anxiety Score 12/02/2017  Nervous, Anxious, on Edge 1  Control/stop worrying 0  Worry too much - different things 1  Trouble relaxing 0  Restless 1  Easily annoyed or irritable 1  Afraid - awful might happen 0  Total GAD 7 Score 4  Anxiety Difficulty Not difficult at all      Past Medical History:  Diagnosis Date  . ADD (attention deficit disorder) 01/29/2015  . Anxiety   .  Depression   . 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 Former cigarette smoker 12/02/2017  . Generalized anxiety disorder 06/25/2012   Child of alcmot   . Gestational diabetes mellitus (GDM) controlled on oral hypoglycemic drug 11/08/2016  . 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 RBBB 12/21/2014  . Syncope 12/21/2014   Past Surgical History:  Procedure Laterality Date  . CESAREAN SECTION N/A 11/09/2016   Procedure: CESAREAN SECTION;  Surgeon: Cheri Fowler, MD;  Location: [facility];  Service: Obstetrics;  Laterality: N/A;  . FRACTURE SURGERY     bilateral wrist   Social History   Tobacco Use  . Smoking status: Former Smoker    Packs/day: 0.50    Years: 7.00    Pack years: 3.50    Types: Cigarettes    Last attempt to quit: 07/15/2013    Years since quitting: 4.3  . Smokeless tobacco: Never Used  Substance Use Topics  . Alcohol use: Yes    Alcohol/week: 1.2 oz    Types: 2  Standard drinks or equivalent per week    Comment: socially   family history includes Arrhythmia in her father.    ROS: negative except as noted in the HPI  Medications: Current Outpatient Medications  Medication Sig Dispense Refill  . norethindrone (MICRONOR,CAMILA,ERRIN) 0.35 MG tablet Take 1 tablet by mouth daily.    . sertraline (ZOLOFT) 100 MG tablet Take 1 tablet (100 mg total) by mouth daily. Take with 25mg  tablet 90 tablet 1  . sertraline (ZOLOFT) 25 MG tablet Take 1 tablet (25 mg total) by mouth daily. Take with 100 mg tablet 90 tablet 1  . amphetamine-dextroamphetamine (ADDERALL) 20 MG tablet Take 1 tablet (20 mg total) by mouth 2 (two) times daily with breakfast and lunch. 60 tablet 0   No current facility-administered medications for this visit.    No Known Allergies     Objective:  BP 120/77   Pulse 72   Wt 161 lb (73 kg)   LMP  11/20/2017 (Exact Date)   Breastfeeding? No   BMI 25.99 kg/m  Gen:  alert, not ill-appearing, no distress, appropriate for age 22: head normocephalic without obvious abnormality, conjunctiva and cornea clear, trachea midline Pulm: Normal work of breathing, normal phonation, clear to auscultation bilaterally, no wheezes, rales or rhonchi CV: Normal rate, regular rhythm, s1 and s2 distinct, no murmurs, clicks or rubs  Neuro: alert and oriented x 3, no tremor MSK: extremities atraumatic, normal gait and station Skin: intact, no rashes on exposed skin, no jaundice, no cyanosis Psych: well-groomed, cooperative, good eye contact, euthymic mood, affect mood-congruent, speech is articulate, and thought processes clear and goal-directed    No results found for this or any previous visit (from the past 72 hour(s)). No results found.    Assessment and Plan: 32 y.o. female with   1. Depression with anxiety - PHQ2=0, PHQ9=6, well-controlled on Zoloft. Follow-up every 6 months - sertraline (ZOLOFT) 25 MG tablet; Take 1 tablet (25 mg total) by mouth daily. Take with 100 mg tablet  Dispense: 90 tablet; Refill: 1 - sertraline (ZOLOFT) 100 MG tablet; Take 1 tablet (100 mg total) by mouth daily. Take with 25mg  tablet  Dispense: 90 tablet; Refill: 1  2. Generalized anxiety disorder - GAD7 score=4,  well-controlled on Zoloft. Follow-up every 6 months  3. Adult ADHD - checked NCCSRS, no red flags. Patient is on OCP, understands safety issues in pregnancy. Re-starting immediate release 1-2 times daily with plan to switch to XR based on TDD - amphetamine-dextroamphetamine (ADDERALL) 20 MG tablet; Take 1 tablet (20 mg total) by mouth 2 (two) times daily with breakfast and lunch.  Dispense: 60 tablet; Refill: 0  4. Screening for blood disease - CBC  5. History of gestational diabetes - Comprehensive metabolic panel - Hemoglobin A1c  6. Screening for lipid disorders - Lipid Panel w/reflex Direct  LDL  7. LGSIL on Pap smear of cervix - 2014, never had a colposcopy  8. Encounter to establish care - reviewed PMH, PSH, PFH, medications and allergies  - reviewed health maintenance - Pap smear overdue, should have had colpo, she has had a child since the abnormal Pap. We will repeat Pap with HPV co-test and refer as needed - declines influenza - Tdap UTD - negative PHQ2   Patient education and anticipatory guidance given Patient agrees with treatment plan Follow-up in 4 weeks for Pap smear/ADHD follow-up or sooner as needed if symptoms worsen or fail to improve  Darlyne Russian PA-C

## 2017-12-03 NOTE — Telephone Encounter (Signed)
Patient called to check on the status of the Adderall. I advised her that it has been sent to the insurance for verification and it may take up to 15 days for an answer.

## 2017-12-12 ENCOUNTER — Encounter: Payer: Self-pay | Admitting: Physician Assistant

## 2017-12-31 ENCOUNTER — Encounter: Payer: Self-pay | Admitting: Physician Assistant

## 2017-12-31 ENCOUNTER — Other Ambulatory Visit (HOSPITAL_COMMUNITY)
Admission: RE | Admit: 2017-12-31 | Discharge: 2017-12-31 | Disposition: A | Discharge status: Home / Self Care | Payer: BLUE CROSS/BLUE SHIELD | Source: Ambulatory Visit | Attending: Physician Assistant | Admitting: Physician Assistant

## 2017-12-31 ENCOUNTER — Ambulatory Visit: Payer: BLUE CROSS/BLUE SHIELD | Admitting: Physician Assistant

## 2017-12-31 VITALS — BP 120/79 | HR 86 | Wt 161.0 lb

## 2017-12-31 DIAGNOSIS — N898 Other specified noninflammatory disorders of vagina: Secondary | ICD-10-CM | POA: Insufficient documentation

## 2017-12-31 DIAGNOSIS — F909 Attention-deficit hyperactivity disorder, unspecified type: Secondary | ICD-10-CM

## 2017-12-31 DIAGNOSIS — Z124 Encounter for screening for malignant neoplasm of cervix: Secondary | ICD-10-CM | POA: Insufficient documentation

## 2017-12-31 DIAGNOSIS — N760 Acute vaginitis: Secondary | ICD-10-CM | POA: Diagnosis not present

## 2017-12-31 DIAGNOSIS — N870 Mild cervical dysplasia: Secondary | ICD-10-CM | POA: Insufficient documentation

## 2017-12-31 DIAGNOSIS — R87619 Unspecified abnormal cytological findings in specimens from cervix uteri: Secondary | ICD-10-CM | POA: Diagnosis not present

## 2017-12-31 DIAGNOSIS — B9689 Other specified bacterial agents as the cause of diseases classified elsewhere: Secondary | ICD-10-CM

## 2017-12-31 DIAGNOSIS — Z01419 Encounter for gynecological examination (general) (routine) without abnormal findings: Secondary | ICD-10-CM | POA: Diagnosis not present

## 2017-12-31 LAB — WET PREP FOR TRICH, YEAST, CLUE
MICRO NUMBER:: 90363047
Specimen Quality: ADEQUATE

## 2017-12-31 MED ORDER — METHYLPHENIDATE HCL ER 18 MG PO TB24: 18.0000 mg | ORAL_TABLET | Freq: Every day | ORAL | 0 refills | Status: DC

## 2017-12-31 NOTE — Progress Notes (Signed)
HPI:                                                                Zoe Wiley is a 32 y.o. female who presents to [facility] today for Pap smear only  Current Concerns include: medication refill  ADHD: taking Adderall 10-20 mg twice a day. Reports she does feel focused and productive, but she does not like how the medication makes her feel. She reports being hyper-focused and not herself. Denies palpitations, headache, sleep disturbance.  GYN/Sexual Health  Obstetrics: [id number]  Menstrual status: having periods  LMP: 12/30/2017  Menses: regular  Last pap smear: 07/2013  History of abnormal pap smears: yes, LSIL  Sexually active: yes  Current contraception: OCP  History of STI: no  Health Maintenance Health Maintenance  Topic Date Due  . PAP SMEAR  08/02/2016  . INFLUENZA VACCINE  08/04/2018 (Originally 05/12/2017)  . TETANUS/TDAP  08/24/2026  . HIV Screening  Completed    Past Medical History:  Diagnosis Date  . ADD (attention deficit disorder) 01/29/2015  . Anxiety   . Depression   . 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 Former cigarette smoker 12/02/2017  . Generalized anxiety disorder 06/25/2012   Child of alcmot   . Gestational diabetes mellitus (GDM) controlled on oral hypoglycemic drug 11/08/2016  . 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 RBBB 12/21/2014  . Syncope 12/21/2014   Past Surgical History:  Procedure Laterality Date  . CESAREAN SECTION N/A 11/09/2016   Procedure: CESAREAN SECTION;  Surgeon: Cheri Fowler, MD;  Location: [facility];  Service: Obstetrics;  Laterality: N/A;  . FRACTURE SURGERY     bilateral wrist   Social History   Tobacco Use  . Smoking status: Former Smoker    Packs/day: 0.50    Years: 7.00    Pack years: 3.50   Types: Cigarettes    Last attempt to quit: 07/15/2013    Years since quitting: 4.4  . Smokeless tobacco: Never Used  Substance Use Topics  . Alcohol use: Yes    Alcohol/week: 1.2 oz    Types: 2 Standard drinks or equivalent per week    Comment: socially   family history includes Arrhythmia in her father.  ROS: negative except as noted in the HPI  Medications: Current Outpatient Medications  Medication Sig Dispense Refill  . amphetamine-dextroamphetamine (ADDERALL) 20 MG tablet Take 1 tablet (20 mg total) by mouth 2 (two) times daily with breakfast and lunch. 60 tablet 0  . norethindrone (MICRONOR,CAMILA,ERRIN) 0.35 MG tablet Take 1 tablet by mouth daily.    . sertraline (ZOLOFT) 100 MG tablet Take 1 tablet (100 mg total) by mouth daily. Take with 25mg  tablet 90 tablet 1  . sertraline (ZOLOFT) 25 MG tablet Take 1 tablet (25 mg total) by mouth daily. Take with 100 mg tablet 90 tablet 1   No current facility-administered medications for this visit.    No Known Allergies     Objective:  BP 120/79   Pulse 86   Wt 161 lb (73 kg)   BMI 25.99 kg/m  GU: vulva without rashes  or lesions, normal introitus and urethral meatus, vaginal mucosa without erythema, normal discharge, cervix non-friable without lesions, unable to fully visualize inferior aspect of cervix, moderate amount of malodorous yellowish discharge  A chaperone was used for the GU portion of the exam, Izell [place], CMA.    No results found for this or any previous visit (from the past 72 hour(s)). No results found.    Assessment and Plan: 32 y.o. female with  1. Encounter for Pap smear of cervix with HPV DNA cotesting - Cytology - PAP w/HPV - hx of LSIL - follow-up based on Pap results  2. Vaginal odor - WET PREP FOR TRICH, YEAST, CLUE - offered empiric treatment for BV, patient does not want to treat empirically and will await results  3. Adult ADHD - stopping Adderall and switching to low-dose Concerta -  methylphenidate 18 MG PO CR tablet; Take 1 tablet (18 mg total) by mouth daily.  Dispense: 30 tablet; Refill: 0  Patient education and anticipatory guidance given Patient agrees with treatment plan Follow-up in 4 weeks for ADHD or sooner as needed  Darlyne Russian PA-C

## 2018-01-03 MED ORDER — METRONIDAZOLE 500 MG PO TABS: 500.0000 mg | ORAL_TABLET | Freq: Two times a day (BID) | ORAL | 0 refills | Status: AC

## 2018-01-03 NOTE — Addendum Note (Signed)
Addended by: Nelson Chimes E on: 01/03/2018 07:51 AM   Modules accepted: Orders

## 2018-01-03 NOTE — Progress Notes (Signed)
Good morning Zoe Wiley,  Your testing shows bacterial vaginosis. We are still waiting on your Pap results.  I have sent Metronidazole to your pharmacy. It is twice a day for 7 days. Do not drink alcohol while taking the antibiotic (causes severe nausea and vomiting).  Best, Evlyn Clines

## 2018-01-05 ENCOUNTER — Encounter: Payer: Self-pay | Admitting: Physician Assistant

## 2018-01-05 DIAGNOSIS — R87810 Cervical high risk human papillomavirus (HPV) DNA test positive: Secondary | ICD-10-CM

## 2018-01-05 HISTORY — DX: Cervical high risk human papillomavirus (HPV) DNA test positive: R87.810

## 2018-01-05 LAB — CYTOLOGY - PAP
HPV 16/18/45 genotyping: NEGATIVE
HPV: DETECTED — AB

## 2018-01-05 NOTE — Progress Notes (Signed)
Good morning Zoe Wiley,  Your Pap smear shows some abnormal cells as well as HPV. The recommendation is a colposcopy to biopsy the cervix. I am referring you to women's health here in our building. If you have another gynecologist you prefer, let me know so I can fax them the results.  Best, Evlyn Clines

## 2018-01-11 ENCOUNTER — Other Ambulatory Visit: Payer: Self-pay | Admitting: Physician Assistant

## 2018-01-11 DIAGNOSIS — R87612 Low grade squamous intraepithelial lesion on cytologic smear of cervix (LGSIL): Secondary | ICD-10-CM

## 2018-01-11 DIAGNOSIS — R87810 Cervical high risk human papillomavirus (HPV) DNA test positive: Secondary | ICD-10-CM

## 2018-01-14 ENCOUNTER — Telehealth: Payer: Self-pay | Admitting: Physician Assistant

## 2018-01-14 NOTE — Telephone Encounter (Signed)
Approvedon April 3 for Concerta. QMGNOI:[id number];[id number];Review Type:Prior Auth;Coverage Start Date:12/11/2017;Coverage End Date:01/12/2019;

## 2018-01-14 NOTE — Telephone Encounter (Signed)
Adderall was approved.

## 2018-01-31 ENCOUNTER — Ambulatory Visit: Payer: BLUE CROSS/BLUE SHIELD | Admitting: Physician Assistant

## 2018-01-31 DIAGNOSIS — Z0189 Encounter for other specified special examinations: Secondary | ICD-10-CM

## 2018-02-09 ENCOUNTER — Other Ambulatory Visit: Payer: Self-pay

## 2018-02-09 MED ORDER — CONCERTA 18 MG PO TBCR: 18.0000 mg | EXTENDED_RELEASE_TABLET | Freq: Every day | ORAL | 0 refills | Status: DC

## 2018-02-14 ENCOUNTER — Ambulatory Visit (INDEPENDENT_AMBULATORY_CARE_PROVIDER_SITE_OTHER): Payer: BLUE CROSS/BLUE SHIELD | Admitting: Obstetrics & Gynecology

## 2018-02-14 ENCOUNTER — Encounter: Payer: Self-pay | Admitting: Obstetrics & Gynecology

## 2018-02-14 VITALS — BP 121/76 | HR 65 | Resp 16 | Ht 66.0 in | Wt 163.0 lb

## 2018-02-14 DIAGNOSIS — Z3202 Encounter for pregnancy test, result negative: Secondary | ICD-10-CM | POA: Diagnosis not present

## 2018-02-14 DIAGNOSIS — B977 Papillomavirus as the cause of diseases classified elsewhere: Secondary | ICD-10-CM

## 2018-02-14 DIAGNOSIS — R87612 Low grade squamous intraepithelial lesion on cytologic smear of cervix (LGSIL): Secondary | ICD-10-CM | POA: Diagnosis not present

## 2018-02-14 DIAGNOSIS — N871 Moderate cervical dysplasia: Secondary | ICD-10-CM | POA: Diagnosis not present

## 2018-02-14 LAB — POCT URINE PREGNANCY: Preg Test, Ur: NEGATIVE

## 2018-02-14 MED ORDER — NORGESTREL-ETHINYL ESTRADIOL 0.3-30 MG-MCG PO TABS: 1.0000 | ORAL_TABLET | Freq: Every day | ORAL | 11 refills | Status: DC

## 2018-02-14 NOTE — Progress Notes (Signed)
   Subjective:    Patient ID: Zoe Wiley, female    DOB: May 28, 1986, 32 y.o.   MRN: [id number]  HPI  32 yo P1 here for a colpo due to LGSIL, 32 y.o. possibly HGSIL pap done. She tells me that she has had the Gardasil series. She gives a h/o an abnormal pap in college but she doesn't recall having a colpo or other procedures.  Review of Systems     Objective:   Physical Exam Breathing, conversing, and ambulating normally Well nourished, well hydrated White female, no apparent distress UPT negative, consent signed, time out done Cervix prepped with acetic acid. She is finishing her period. Transformation zone seen in its entirety. Colpo adequate, although I had to use small Qtip to open the cervical os to see the extent of the lesion. Changes c/w LGSIL seen in a circumferencial fashion at the os (acetowhite changes) I biopsied the area at 11 and 5 o'clock ECC obtained. She tolerated the procedure well.     Assessment & Plan:  LGSIL pap- c/w colposcopic findings Await pathology Change OCP to combination pills as she is no longer breastfeeding.

## 2018-02-14 NOTE — Addendum Note (Signed)
Addended by: Asencion Islam on: 02/14/2018 04:26 PM   Modules accepted: Orders

## 2018-02-14 NOTE — Addendum Note (Signed)
Addended by: Asencion Islam on: 02/14/2018 04:25 PM   Modules accepted: Orders

## 2018-03-01 ENCOUNTER — Telehealth: Payer: Self-pay | Admitting: *Deleted

## 2018-03-01 NOTE — Telephone Encounter (Signed)
-----   Message from Emily Filbert, MD sent at 03/01/2018  1:26 PM EDT ----- She needs an appt to discuss her results.

## 2018-03-21 ENCOUNTER — Other Ambulatory Visit: Payer: Self-pay

## 2018-03-21 MED ORDER — CONCERTA 18 MG PO TBCR: 18.0000 mg | EXTENDED_RELEASE_TABLET | Freq: Every day | ORAL | 0 refills | Status: DC

## 2018-04-04 ENCOUNTER — Ambulatory Visit (INDEPENDENT_AMBULATORY_CARE_PROVIDER_SITE_OTHER): Payer: BLUE CROSS/BLUE SHIELD | Admitting: Obstetrics & Gynecology

## 2018-04-04 ENCOUNTER — Encounter: Payer: Self-pay | Admitting: Obstetrics & Gynecology

## 2018-04-04 VITALS — BP 120/72 | HR 80 | Resp 16 | Ht 66.0 in | Wt 167.0 lb

## 2018-04-04 DIAGNOSIS — R87613 High grade squamous intraepithelial lesion on cytologic smear of cervix (HGSIL): Secondary | ICD-10-CM

## 2018-04-04 DIAGNOSIS — Z3202 Encounter for pregnancy test, result negative: Secondary | ICD-10-CM | POA: Diagnosis not present

## 2018-04-04 DIAGNOSIS — Z01812 Encounter for preprocedural laboratory examination: Secondary | ICD-10-CM

## 2018-04-04 DIAGNOSIS — N871 Moderate cervical dysplasia: Secondary | ICD-10-CM | POA: Diagnosis not present

## 2018-04-04 LAB — POCT URINE PREGNANCY: PREG TEST UR: NEGATIVE

## 2018-04-04 NOTE — Addendum Note (Signed)
Addended by: Asencion Islam on: 04/04/2018 02:57 PM   Modules accepted: Orders

## 2018-04-04 NOTE — Progress Notes (Signed)
   Subjective:    Patient ID: Zoe Wiley, female    DOB: 1986-01-23, 32 y.o.   MRN: [id number]  HPI  32 yo P1 here for a LEEP. 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 Her ECC was negative.  Review of Systems She uses OCPs for contraception, due to start her period next week.    Objective:   Physical Exam Well nourished, well hydrated White female, no apparent distress Breathing, conversing, and ambulating normally  Colpo Biopsy:   Risks, benefits, alternatives, and limitations of procedure explained to patient, including pain, bleeding, infection, failure to remove abnormal tissue and failure to cure dysplasia, need for repeat procedures, damage to pelvic organs, cervical incompetence.  Role of HPV,cervical dysplasia and need for close followup was empasized. Informed written consent was obtained. All questions were answered. Time out performed. Urine pregnancy test was negative.  Procedure: The patient was placed in lithotomy position and the bivalved coated speculum was placed in the patient's vagina. A grounding pad placed on the patient. Acetic acid was applied to the cervix and areas of decreased uptake were noted around the transformation zone.   Local anesthesia was administered via an intracervical block using 10cc of 2% Lidocaine with epinephrine. The suction was turned on and the large semicircular was used to remove a shallow portion of her cervix. I then used the small semicircular electrode on 12 Watts of cutting current was used to excise a slightly deeper portion of cervix. The entire lesion was removed.  I obtained an ECC.  Excellent hemostasis was achieved using roller ball coagulation set at 50 Watts coagulation current. The speculum was removed from the vagina. Specimens were sent to pathology.  The patient tolerated the procedure well. Post-operative instructions given to patient, including instruction to seek medical attention for persistent bright red bleeding,  fever, abdominal/pelvic pain, dysuria, nausea or vomiting. She was also told about the possibility of having copious yellow to black tinged discharge for weeks. She was counseled to avoid anything in the vagina (sex/douching/tampons) for 3 weeks. She has a 4 week post-operative check to assess wound healing, review results and discuss further management.      Assessment & Plan:  Cin 2&3- await pathology

## 2018-04-18 ENCOUNTER — Other Ambulatory Visit: Payer: Self-pay

## 2018-04-19 MED ORDER — CONCERTA 18 MG PO TBCR: 18.0000 mg | EXTENDED_RELEASE_TABLET | Freq: Every day | ORAL | 0 refills | Status: DC

## 2018-05-09 ENCOUNTER — Telehealth: Payer: Self-pay | Admitting: *Deleted

## 2018-05-09 NOTE — Telephone Encounter (Signed)
Pt notified of pathology results.  I had Dr Gala Romney to review and recommendations are repeat pap in 12 and 24 months.  Pt has appt on 05/23/18 for F/U Leep

## 2018-05-20 ENCOUNTER — Other Ambulatory Visit: Payer: Self-pay

## 2018-05-20 MED ORDER — CONCERTA 18 MG PO TBCR: 18.0000 mg | EXTENDED_RELEASE_TABLET | Freq: Every day | ORAL | 0 refills | Status: DC

## 2018-05-20 NOTE — Progress Notes (Unsigned)
Done

## 2018-05-23 ENCOUNTER — Ambulatory Visit: Payer: BLUE CROSS/BLUE SHIELD | Admitting: Obstetrics & Gynecology

## 2018-05-23 ENCOUNTER — Encounter: Payer: Self-pay | Admitting: Obstetrics & Gynecology

## 2018-05-23 VITALS — BP 131/82 | HR 70 | Resp 16 | Ht 66.0 in | Wt 168.0 lb

## 2018-05-23 DIAGNOSIS — D069 Carcinoma in situ of cervix, unspecified: Secondary | ICD-10-CM

## 2018-05-23 DIAGNOSIS — Z9889 Other specified postprocedural states: Secondary | ICD-10-CM

## 2018-05-23 DIAGNOSIS — R87613 High grade squamous intraepithelial lesion on cytologic smear of cervix (HGSIL): Secondary | ICD-10-CM

## 2018-05-23 NOTE — Progress Notes (Signed)
   Subjective:    Patient ID: Zoe Wiley, female    DOB: 01-May-1986, 32 y.o.   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 She has had no problems.   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

## 2018-06-20 ENCOUNTER — Other Ambulatory Visit: Payer: Self-pay

## 2018-06-20 MED ORDER — CONCERTA 18 MG PO TBCR: 18.0000 mg | EXTENDED_RELEASE_TABLET | Freq: Every day | ORAL | 0 refills | Status: DC

## 2018-07-12 ENCOUNTER — Other Ambulatory Visit: Payer: Self-pay | Admitting: Physician Assistant

## 2018-07-12 DIAGNOSIS — F418 Other specified anxiety disorders: Secondary | ICD-10-CM

## 2018-07-21 ENCOUNTER — Other Ambulatory Visit: Payer: Self-pay

## 2018-07-21 MED ORDER — CONCERTA 18 MG PO TBCR: 18.0000 mg | EXTENDED_RELEASE_TABLET | Freq: Every day | ORAL | 0 refills | Status: DC

## 2018-09-07 ENCOUNTER — Other Ambulatory Visit: Payer: Self-pay

## 2018-09-07 NOTE — Progress Notes (Signed)
Overdue for follow-up. No additional refills

## 2018-09-07 NOTE — Progress Notes (Signed)
Pt notified and scheduled for Monday -EH/RMA

## 2018-09-12 ENCOUNTER — Ambulatory Visit (INDEPENDENT_AMBULATORY_CARE_PROVIDER_SITE_OTHER): Payer: BLUE CROSS/BLUE SHIELD | Admitting: Physician Assistant

## 2018-09-12 ENCOUNTER — Encounter: Payer: Self-pay | Admitting: Physician Assistant

## 2018-09-12 VITALS — BP 143/83 | HR 69 | Wt 177.0 lb

## 2018-09-12 DIAGNOSIS — F411 Generalized anxiety disorder: Secondary | ICD-10-CM

## 2018-09-12 DIAGNOSIS — R030 Elevated blood-pressure reading, without diagnosis of hypertension: Secondary | ICD-10-CM | POA: Insufficient documentation

## 2018-09-12 DIAGNOSIS — F909 Attention-deficit hyperactivity disorder, unspecified type: Secondary | ICD-10-CM | POA: Diagnosis not present

## 2018-09-12 DIAGNOSIS — I10 Essential (primary) hypertension: Secondary | ICD-10-CM

## 2018-09-12 MED ORDER — CONCERTA 18 MG PO TBCR: 18.0000 mg | EXTENDED_RELEASE_TABLET | Freq: Every day | ORAL | 0 refills | Status: DC

## 2018-09-12 MED ORDER — METHYLPHENIDATE HCL ER (OSM) 18 MG PO TBCR: 18.0000 mg | EXTENDED_RELEASE_TABLET | ORAL | 0 refills | Status: DC

## 2018-09-12 MED ORDER — SERTRALINE HCL 100 MG PO TABS: 100.0000 mg | ORAL_TABLET | Freq: Every day | ORAL | 0 refills | Status: DC

## 2018-09-12 MED ORDER — SERTRALINE HCL 25 MG PO TABS: 50.0000 mg | ORAL_TABLET | Freq: Every day | ORAL | 0 refills | Status: DC

## 2018-09-12 MED ORDER — METHYLPHENIDATE HCL ER (OSM) 18 MG PO TBCR: 18.0000 mg | EXTENDED_RELEASE_TABLET | Freq: Every day | ORAL | 0 refills | Status: DC

## 2018-09-12 NOTE — Progress Notes (Signed)
HPI:                                                                Zoe Wiley is a 32 y.o. female who presents to [facility]: [place] today for anxiety follow-up  Anxiety  Presents for follow-up visit. Symptoms include excessive worry, nervous/anxious behavior and restlessness. Patient reports no compulsions, decreased concentration, depressed mood, obsessions or panic. Symptoms occur occasionally. The severity of symptoms is moderate. The quality of sleep is good. Nighttime awakenings: none.    She is feeling some restlessness and concerned that her anxiety is not fully controlled.  She has tried skipping several doses of Concerta but really did not notice much of a difference in her anxiety.    Depression screen [facility] 2/9 09/12/2018 12/02/2017 07/27/2015 01/03/2014  Decreased Interest 0 0 0 0  Down, Depressed, Hopeless 0 0 0 0  PHQ - 2 Score 0 0 0 0  Altered sleeping 0 1 - -  Tired, decreased energy 0 0 - -  Change in appetite 0 0 - -  Feeling bad or failure about yourself  0 0 - -  Trouble concentrating 0 3 - -  Moving slowly or fidgety/restless 1 2 - -  Suicidal thoughts 0 0 - -  PHQ-9 Score 1 6 - -  Difficult doing work/chores Somewhat difficult Not difficult at all - -    GAD 7 : Generalized Anxiety Score 09/12/2018 12/02/2017  Nervous, Anxious, on Edge 1 1  Control/stop worrying 1 0  Worry too much - different things 1 1  Trouble relaxing 0 0  Restless 0 1  Easily annoyed or irritable 1 1  Afraid - awful might happen 1 0  Total GAD 7 Score 5 4  Anxiety Difficulty Somewhat difficult Not difficult at all      Past Medical History:  Diagnosis Date  . ADD (attention deficit disorder) 01/29/2015  . Anxiety   . Cervical high risk human papillomavirus (HPV) DNA test positive 01/05/2018  . Depression   . 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 Former cigarette smoker 12/02/2017  . Generalized anxiety disorder 06/25/2012   Child of  alcmot   . Gestational diabetes mellitus (GDM) controlled on oral hypoglycemic drug 11/08/2016  . 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 RBBB 12/21/2014  . Syncope 12/21/2014   Past Surgical History:  Procedure Laterality Date  . CESAREAN SECTION N/A 11/09/2016   Procedure: CESAREAN SECTION;  Surgeon: Cheri Fowler, MD;  Location: [facility];  Service: Obstetrics;  Laterality: N/A;  . FRACTURE SURGERY     bilateral wrist   Social History   Tobacco Use  . Smoking status: Former Smoker    Packs/day: 0.50    Years: 7.00    Pack years: 3.50    Types: Cigarettes    Last attempt to quit: 07/15/2013    Years since quitting: 5.1  . Smokeless tobacco: Never Used  Substance Use Topics  . Alcohol use: Yes    Alcohol/week: 2.0 standard drinks    Types: 2 Standard drinks or equivalent per week    Comment: socially  family history includes Arrhythmia in her father.    ROS: negative except as noted in the HPI  Medications: Current Outpatient Medications  Medication Sig Dispense Refill  . CONCERTA 18 MG CR tablet Take 1 tablet (18 mg total) by mouth daily. 30 tablet 0  . norgestrel-ethinyl estradiol (LO/OVRAL,CRYSELLE) 0.3-30 MG-MCG tablet Take 1 tablet by mouth daily. 1 Package 11  . sertraline (ZOLOFT) 25 MG tablet Take 2 tablets (50 mg total) by mouth daily. 180 tablet 0  . [START ON 10/12/2018] methylphenidate (CONCERTA) 18 MG PO CR tablet Take 1 tablet (18 mg total) by mouth every morning. 30 tablet 0  . [START ON 11/11/2018] methylphenidate (CONCERTA) 18 MG PO CR tablet Take 1 tablet (18 mg total) by mouth daily. 30 tablet 0  . sertraline (ZOLOFT) 100 MG tablet Take 1 tablet (100 mg total) by mouth daily. 90 tablet 0   No current facility-administered medications for this visit.    No Known Allergies     Objective:   BP (!) 143/83   Pulse 69   Wt 177 lb (80.3 kg)   BMI 28.57 kg/m  Gen:  alert, not ill-appearing, no distress, appropriate for age 56: head normocephalic without obvious abnormality, conjunctiva and cornea clear, trachea midline Pulm: Normal work of breathing, normal phonation, clear to auscultation bilaterally, no wheezes, rales or rhonchi CV: Normal rate, regular rhythm, s1 and s2 distinct, no murmurs, clicks or rubs  Neuro: alert and oriented x 3, no tremor MSK: extremities atraumatic, normal gait and station Skin: intact, no rashes on exposed skin, no jaundice, no cyanosis Psych: well-groomed, cooperative, good eye contact, euthymic mood, affect mood-congruent, speech is articulate, and thought processes clear and goal-directed, good insight, normal judgment  Vitals:   09/12/18 1624 09/12/18 1644  BP: (!) 153/100 (!) 143/83  Pulse: 61 69     No results found for this or any previous visit (from the past 72 hour(s)). No results found.    Assessment and Plan: 32 y.o. female with   .Diagnoses and all orders for this visit:  Generalized anxiety disorder -     sertraline (ZOLOFT) 25 MG tablet; Take 2 tablets (50 mg total) by mouth daily. -     sertraline (ZOLOFT) 100 MG tablet; Take 1 tablet (100 mg total) by mouth daily.  Elevated blood pressure reading  Adult ADHD -     CONCERTA 18 MG CR tablet; Take 1 tablet (18 mg total) by mouth daily. -     methylphenidate (CONCERTA) 18 MG PO CR tablet; Take 1 tablet (18 mg total) by mouth every morning. -     methylphenidate (CONCERTA) 18 MG PO CR tablet; Take 1 tablet (18 mg total) by mouth daily.   GAD7=5 We discussed the overlap between anxiety and ADHD symptoms.  She feels her restlessness is more attributable to anxiety Increasing sertraline to 150 mg daily Continue Concerta 18 mg daily, encouraged to take drug holidays as needed  Blood pressure elevated on 2 checks in office today.  She has a history of syncope and  ectopic atrial rhythm and was evaluated by cardiology in 2016 for this.  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 Active surveillance  Patient education and anticipatory guidance given Patient agrees with treatment plan Follow-up in 3 months or sooner as needed if symptoms worsen or fail to improve  Darlyne Russian PA-C

## 2018-09-12 NOTE — Patient Instructions (Addendum)
Send me a MyChart message in about 4 weeks    Living With Anxiety After being diagnosed with an anxiety disorder, you may be relieved to know why you have felt or behaved a certain way. It is natural to also feel overwhelmed about the treatment ahead and what it will mean for your life. With care and support, you can manage this condition and recover from it. How to cope with anxiety Dealing with stress Stress is your body's reaction to life changes and events, both good and bad. Stress can last just a few hours or it can be ongoing. Stress can play a major role in anxiety, so it is important to learn both how to cope with stress and how to think about it differently. Talk with your health care provider or a counselor to learn more about stress reduction. He or she may suggest some stress reduction techniques, such as:  Music therapy. This can include creating or listening to music that you enjoy and that inspires you.  Mindfulness-based meditation. This involves being aware of your normal breaths, rather than trying to control your breathing. It can be done while sitting or walking.  Centering prayer. This is a kind of meditation that involves focusing on a word, phrase, or sacred image that is meaningful to you and that brings you peace.  Deep breathing. To do this, expand your stomach and inhale slowly through your nose. Hold your breath for 3-5 seconds. Then exhale slowly, allowing your stomach muscles to relax.  Self-talk. This is a skill where you identify thought patterns that lead to anxiety reactions and correct those thoughts.  Muscle relaxation. This involves tensing muscles then relaxing them.  Choose a stress reduction technique that fits your lifestyle and personality. Stress reduction techniques take time and practice. Set aside 5-15 minutes a day to do them. Therapists can offer training in these techniques. The training may be covered by some insurance plans. Other things you  can do to manage stress include:  Keeping a stress diary. This can help you learn what triggers your stress and ways to control your response.  Thinking about how you respond to certain situations. You may not be able to control everything, but you can control your reaction.  Making time for activities that help you relax, and not feeling guilty about spending your time in this way.  Therapy combined with coping and stress-reduction skills provides the best chance for successful treatment. Medicines Medicines can help ease symptoms. Medicines for anxiety include:  Anti-anxiety drugs.  Antidepressants.  Beta-blockers.  Medicines may be used as the main treatment for anxiety disorder, along with therapy, or if other treatments are not working. Medicines should be prescribed by a health care provider. Relationships Relationships can play a big part in helping you recover. Try to spend more time connecting with trusted friends and family members. Consider going to couples counseling, taking family education classes, or going to family therapy. Therapy can help you and others better understand the condition. How to recognize changes in your condition Everyone has a different response to treatment for anxiety. Recovery from anxiety happens when symptoms decrease and stop interfering with your daily activities at home or work. This may mean that you will start to:  Have better concentration and focus.  Sleep better.  Be less irritable.  Have more energy.  Have improved memory.  It is important to recognize when your condition is getting worse. Contact your health care provider if your symptoms interfere  with home or work and you do not feel like your condition is improving. Where to find help and support: You can get help and support from these sources:  Self-help groups.  Online and [employer].  A trusted spiritual leader.  Couples counseling.  Family education  classes.  Family therapy.  Follow these instructions at home:  Eat a healthy diet that includes plenty of vegetables, fruits, whole grains, low-fat dairy products, and lean protein. Do not eat a lot of foods that are high in solid fats, added sugars, or salt.  Exercise. Most adults should do the following: ? Exercise for at least 150 minutes each week. The exercise should increase your heart rate and make you sweat (moderate-intensity exercise). ? Strengthening exercises at least twice a week.  Cut down on caffeine, tobacco, alcohol, and other potentially harmful substances.  Get the right amount and quality of sleep. Most adults need 7-9 hours of sleep each night.  Make choices that simplify your life.  Take over-the-counter and prescription medicines only as told by your health care provider.  Avoid caffeine, alcohol, and certain over-the-counter cold medicines. These may make you feel worse. Ask your pharmacist which medicines to avoid.  Keep all follow-up visits as told by your health care provider. This is important. Questions to ask your health care provider  Would I benefit from therapy?  How often should I follow up with a health care provider?  How long do I need to take medicine?  Are there any long-term side effects of my medicine?  Are there any alternatives to taking medicine? Contact a health care provider if:  You have a hard time staying focused or finishing daily tasks.  You spend many hours a day feeling worried about everyday life.  You become exhausted by worry.  You start to have headaches, feel tense, or have nausea.  You urinate more than normal.  You have diarrhea. Get help right away if:  You have a racing heart and shortness of breath.  You have thoughts of hurting yourself or others. If you ever feel like you may hurt yourself or others, or have thoughts about taking your own life, get help right away. You can go to your nearest emergency  department or call:  Your local emergency services (911 in the [place]  A suicide crisis helpline, such as the [facility] at 912-807-1401. This is open 24-hours a day.  Summary  Taking steps to deal with stress can help calm you.  Medicines cannot cure anxiety disorders, but they can help ease symptoms.  Family, friends, and partners can play a big part in helping you recover from an anxiety disorder. This information is not intended to replace advice given to you by your health care provider. Make sure you discuss any questions you have with your health care provider. Document Released: 09/22/2016 Document Revised: 09/22/2016 Document Reviewed: 09/22/2016 Elsevier Interactive Patient Education  [employer].

## 2018-09-14 ENCOUNTER — Encounter: Payer: Self-pay | Admitting: Physician Assistant

## 2018-09-14 DIAGNOSIS — I10 Essential (primary) hypertension: Secondary | ICD-10-CM | POA: Insufficient documentation

## 2018-12-12 ENCOUNTER — Ambulatory Visit: Payer: BLUE CROSS/BLUE SHIELD | Admitting: Physician Assistant

## 2018-12-22 ENCOUNTER — Other Ambulatory Visit: Payer: Self-pay | Admitting: Physician Assistant

## 2018-12-22 DIAGNOSIS — F411 Generalized anxiety disorder: Secondary | ICD-10-CM

## 2019-01-17 ENCOUNTER — Other Ambulatory Visit: Payer: Self-pay

## 2019-01-17 ENCOUNTER — Encounter: Payer: Self-pay | Admitting: Physician Assistant

## 2019-01-17 ENCOUNTER — Ambulatory Visit (INDEPENDENT_AMBULATORY_CARE_PROVIDER_SITE_OTHER): Payer: BLUE CROSS/BLUE SHIELD | Admitting: Physician Assistant

## 2019-01-17 ENCOUNTER — Other Ambulatory Visit: Payer: Self-pay | Admitting: *Deleted

## 2019-01-17 VITALS — Wt 174.0 lb

## 2019-01-17 DIAGNOSIS — F988 Other specified behavioral and emotional disorders with onset usually occurring in childhood and adolescence: Secondary | ICD-10-CM | POA: Diagnosis not present

## 2019-01-17 DIAGNOSIS — F411 Generalized anxiety disorder: Secondary | ICD-10-CM

## 2019-01-17 MED ORDER — SERTRALINE HCL 100 MG PO TABS: 100.0000 mg | ORAL_TABLET | Freq: Every day | ORAL | 0 refills | Status: DC

## 2019-01-17 MED ORDER — METHYLPHENIDATE HCL ER (OSM) 18 MG PO TBCR: 18.0000 mg | EXTENDED_RELEASE_TABLET | Freq: Every day | ORAL | 0 refills | Status: DC

## 2019-01-17 MED ORDER — SERTRALINE HCL 25 MG PO TABS: ORAL_TABLET | ORAL | 0 refills | Status: DC

## 2019-01-17 MED ORDER — NORGESTREL-ETHINYL ESTRADIOL 0.3-30 MG-MCG PO TABS: 1.0000 | ORAL_TABLET | Freq: Every day | ORAL | 3 refills | Status: DC

## 2019-01-17 NOTE — Progress Notes (Signed)
Virtual Visit via Video Note  I connected with Zoe Wiley on 01/17/19 at  8:10 AM EDT by a video enabled telemedicine application and verified that I am speaking with the correct person using two identifiers.   I discussed the limitations of evaluation and management by telemedicine and the availability of in person appointments. The patient expressed understanding and agreed to proceed.  History of Present Illness:  Anxiety - currently taking Sertraline 125 mg daily. Decided not to increase her dose to 150 mg because she was nervous about side effects. Ultimately she would like to be off of medication altogether - overall coping with anxiety symptoms well. Reports going through a grieving process regarding the pandemic about a month ago where she felt scared, then sad, then angry and now has accepted it as the new normal - she is working from home now - she enjoys spending time with her family. Her daughter Alinda Sierras is 99 years old - she started working out regularly, 5 days per week doing a home aerobic exercise program     Past Medical History:  Diagnosis Date  . ADD (attention deficit disorder) 01/29/2015  . Anxiety   . Cervical high risk human papillomavirus (HPV) DNA test positive 01/05/2018  . Depression   . 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 Former cigarette smoker 12/02/2017  . Generalized anxiety disorder 06/25/2012   Child of alcmot   . Gestational diabetes mellitus (GDM) controlled on oral hypoglycemic drug 11/08/2016  . 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 RBBB 12/21/2014  . Syncope 12/21/2014   Past Surgical History:  Procedure Laterality Date  . CESAREAN SECTION N/A 11/09/2016   Procedure: CESAREAN SECTION;  Surgeon: Cheri Fowler, MD;  Location: [facility];  Service: Obstetrics;  Laterality: N/A;  . FRACTURE  SURGERY     bilateral wrist   Social History   Tobacco Use  . Smoking status: Former Smoker    Packs/day: 0.50    Years: 7.00    Pack years: 3.50    Types: Cigarettes    Last attempt to quit: 07/15/2013    Years since quitting: 5.5  . Smokeless tobacco: Never Used  Substance Use Topics  . Alcohol use: Yes    Alcohol/week: 2.0 standard drinks    Types: 2 Standard drinks or equivalent per week    Comment: socially   family history includes Arrhythmia in her father.    ROS: negative except as noted in the HPI  Medications: Current Outpatient Medications  Medication Sig Dispense Refill  . methylphenidate (CONCERTA) 18 MG PO CR tablet Take 1 tablet (18 mg total) by mouth daily. 30 tablet 0  . norgestrel-ethinyl estradiol (LO/OVRAL,CRYSELLE) 0.3-30 MG-MCG tablet Take 1 tablet by mouth daily. 1 Package 3  . sertraline (ZOLOFT) 100 MG tablet Take 1 tablet (100 mg total) by mouth daily. 90 tablet 0  . sertraline (ZOLOFT) 25 MG tablet Follow instruction for taper: 4 tabs PO QD x 1 week, 3 tabs PO QD x 1 week, 2 tabs PO QD x 1 week, 1 tab PO QD x 1 week 70 tablet 0   No current facility-administered medications for this visit.    No Known Allergies     Objective:  Wt 174 lb (78.9 kg)   LMP 01/03/2019 (Approximate)   BMI 28.08 kg/m  Gen:  alert, not ill-appearing, no distress, appropriate  for age 50: head normocephalic without obvious abnormality, conjunctiva and cornea clear, trachea midline Psych: well-groomed, cooperative, good eye contact, euthymic mood, affect mood-congruent, speech is articulate, thought processes clear and goal-directed, normal judgment, good insight    No results found for this or any previous visit (from the past 72 hour(s)). No results found.    Assessment and Plan: 33 y.o. female with   GAD - well controlled - discussed option of Sertraline taper. She will reduce current dose to 100 mg QD with option to remain on this dose for the next 3  months or initiate a taper of 25 mg weekly at her own discretion. If she experiences any withdrawal symptoms she is to remain on the current dose for at least 14 days - follow-up in 3 months  Adult ADD - she was not able to measure her blood pressure today. I instructed her on home BP monitoring and she will purchase an automated cuff. Send home BP readings via MyChart in 2 weeks - reviewed PDMP, no red flags - refill of low-dose Concerta sent. Would not exceed 18 mg daily until BP is monitored  Follow Up Instructions:    I discussed the assessment and treatment plan with the patient. The patient was provided an opportunity to ask questions and all were answered. The patient agreed with the plan and demonstrated an understanding of the instructions.   The patient was advised to call back or seek an in-person evaluation if the symptoms worsen or if the condition fails to improve as anticipated.  I provided 10 minutes of non-face-to-face time during this encounter.   Trixie Dredge, [place]

## 2019-01-18 NOTE — Telephone Encounter (Signed)
Approved today  (Concerta) [id number];MMITVI:[id number];Review Type:Prior Auth;Coverage Start Date:12/19/2018;Coverage End Date:01/18/2020; Pharmacy aware.

## 2019-01-18 NOTE — Telephone Encounter (Signed)
Received fax from [employer] that Concerta requires a PA. Information has been sent to the insurance company. Awaiting determination.

## 2019-02-14 ENCOUNTER — Other Ambulatory Visit: Payer: Self-pay | Admitting: Physician Assistant

## 2019-02-14 DIAGNOSIS — F411 Generalized anxiety disorder: Secondary | ICD-10-CM

## 2019-02-14 DIAGNOSIS — F988 Other specified behavioral and emotional disorders with onset usually occurring in childhood and adolescence: Secondary | ICD-10-CM

## 2019-02-14 MED ORDER — METHYLPHENIDATE HCL ER (OSM) 18 MG PO TBCR: 18.0000 mg | EXTENDED_RELEASE_TABLET | Freq: Every day | ORAL | 0 refills | Status: DC

## 2019-02-14 NOTE — Telephone Encounter (Signed)
Will forward to provider to review.

## 2019-03-13 ENCOUNTER — Other Ambulatory Visit: Payer: Self-pay | Admitting: Physician Assistant

## 2019-03-13 DIAGNOSIS — F988 Other specified behavioral and emotional disorders with onset usually occurring in childhood and adolescence: Secondary | ICD-10-CM

## 2019-03-13 MED ORDER — METHYLPHENIDATE HCL ER (OSM) 18 MG PO TBCR: 18.0000 mg | EXTENDED_RELEASE_TABLET | Freq: Every day | ORAL | 0 refills | Status: DC

## 2019-03-13 NOTE — Telephone Encounter (Signed)
[facility] requesting med refill for concerta.

## 2019-04-10 ENCOUNTER — Other Ambulatory Visit: Payer: Self-pay | Admitting: Physician Assistant

## 2019-04-10 DIAGNOSIS — F988 Other specified behavioral and emotional disorders with onset usually occurring in childhood and adolescence: Secondary | ICD-10-CM

## 2019-04-13 ENCOUNTER — Other Ambulatory Visit: Payer: Self-pay

## 2019-04-13 DIAGNOSIS — F988 Other specified behavioral and emotional disorders with onset usually occurring in childhood and adolescence: Secondary | ICD-10-CM

## 2019-04-13 NOTE — Progress Notes (Signed)
Recommendations left on vm -EH/RMA  

## 2019-04-13 NOTE — Progress Notes (Signed)
Refill request denied Patient needs to send home BP readings or come in for a nurse visit to have BP monitored

## 2019-04-17 ENCOUNTER — Other Ambulatory Visit: Payer: Self-pay

## 2019-04-17 ENCOUNTER — Encounter: Payer: Self-pay | Admitting: Physician Assistant

## 2019-04-17 ENCOUNTER — Ambulatory Visit (INDEPENDENT_AMBULATORY_CARE_PROVIDER_SITE_OTHER): Payer: BC Managed Care – PPO | Admitting: Physician Assistant

## 2019-04-17 ENCOUNTER — Other Ambulatory Visit: Payer: Self-pay | Admitting: Physician Assistant

## 2019-04-17 VITALS — BP 136/83 | HR 76 | Temp 98.6°F | Wt 183.0 lb

## 2019-04-17 DIAGNOSIS — Z1389 Encounter for screening for other disorder: Secondary | ICD-10-CM

## 2019-04-17 DIAGNOSIS — Z130 Encounter for screening for diseases of the blood and blood-forming organs and certain disorders involving the immune mechanism: Secondary | ICD-10-CM

## 2019-04-17 DIAGNOSIS — F988 Other specified behavioral and emotional disorders with onset usually occurring in childhood and adolescence: Secondary | ICD-10-CM

## 2019-04-17 DIAGNOSIS — R002 Palpitations: Secondary | ICD-10-CM | POA: Insufficient documentation

## 2019-04-17 DIAGNOSIS — Z13220 Encounter for screening for lipoid disorders: Secondary | ICD-10-CM | POA: Diagnosis not present

## 2019-04-17 DIAGNOSIS — R011 Cardiac murmur, unspecified: Secondary | ICD-10-CM

## 2019-04-17 DIAGNOSIS — Z3041 Encounter for surveillance of contraceptive pills: Secondary | ICD-10-CM

## 2019-04-17 DIAGNOSIS — Z309 Encounter for contraceptive management, unspecified: Secondary | ICD-10-CM | POA: Insufficient documentation

## 2019-04-17 DIAGNOSIS — Z630 Problems in relationship with spouse or partner: Secondary | ICD-10-CM | POA: Insufficient documentation

## 2019-04-17 DIAGNOSIS — F329 Major depressive disorder, single episode, unspecified: Secondary | ICD-10-CM | POA: Diagnosis not present

## 2019-04-17 DIAGNOSIS — F411 Generalized anxiety disorder: Secondary | ICD-10-CM | POA: Diagnosis not present

## 2019-04-17 DIAGNOSIS — I10 Essential (primary) hypertension: Secondary | ICD-10-CM | POA: Insufficient documentation

## 2019-04-17 DIAGNOSIS — Z1329 Encounter for screening for other suspected endocrine disorder: Secondary | ICD-10-CM

## 2019-04-17 DIAGNOSIS — R6889 Other general symptoms and signs: Secondary | ICD-10-CM | POA: Insufficient documentation

## 2019-04-17 DIAGNOSIS — Z87898 Personal history of other specified conditions: Secondary | ICD-10-CM | POA: Insufficient documentation

## 2019-04-17 DIAGNOSIS — Z3480 Encounter for supervision of other normal pregnancy, unspecified trimester: Secondary | ICD-10-CM

## 2019-04-17 MED ORDER — BUSPIRONE HCL 5 MG PO TABS: 5.0000 mg | ORAL_TABLET | Freq: Three times a day (TID) | ORAL | 0 refills | Status: DC | PRN

## 2019-04-17 MED ORDER — SERTRALINE HCL 50 MG PO TABS: 50.0000 mg | ORAL_TABLET | Freq: Every day | ORAL | 0 refills | Status: DC

## 2019-04-17 MED ORDER — SERTRALINE HCL 100 MG PO TABS: 100.0000 mg | ORAL_TABLET | Freq: Every day | ORAL | 0 refills | Status: DC

## 2019-04-17 MED ORDER — NORETHINDRONE 0.35 MG PO TABS: 1.0000 | ORAL_TABLET | Freq: Every day | ORAL | 3 refills | Status: DC

## 2019-04-17 NOTE — Progress Notes (Signed)
HPI:                                                                Zoe Wiley is a 33 y.o. female who presents to [facility] [place] today for anxiety/blood pressure follow-up  Interval hx 04/17/2019 - she tried to taper off of Sertraline, but opted to continue on 125 mg daily due to increased stress. She has taken Clonazepam in the past, but states after several years of taking this she felt like it caused memory problems - reports she is having marital conflict and has reached out to a marital counselor. She endorses increased stress related to her marriage and working from home with 21.8 year old daughter at home - she stopped aerobic exercise program because she noticed a "pulsing sensation throughout her entire body" that occurs with vigorous exercise like [employer]. She is able to walk a brisk pace without symptoms including exertional chest pain/DOE. She has a history of syncope in 2016 and underwent exercise stress testing at that time that showed diastolic hypertensive response to exercise up to 115. Of note, she has not had any additional syncopal episodes since 2016 - she has not taken Concerta in about 2 weeks. She did not purchase a home BP cuff  01/17/2019 - currently taking Sertraline 125 mg daily. Decided not to increase her dose to 150 mg because she was nervous about side effects. Ultimately she would like to be off of medication altogether - overall coping with anxiety symptoms well. Reports going through a grieving process regarding the pandemic about a month ago where she felt scared, then sad, then angry and now has accepted it as the new normal - she is working from home now - she enjoys spending time with her family. Her daughter Alinda Sierras is 81 years old - she started working out regularly, 5 days per week doing a home aerobic exercise program   Depression screen [facility] 2/9 04/17/2019 09/12/2018 12/02/2017 07/27/2015 01/03/2014  Decreased  Interest 0 0 0 0 0  Down, Depressed, Hopeless 2 0 0 0 0  PHQ - 2 Score 2 0 0 0 0  Altered sleeping 2 0 1 - -  Tired, decreased energy 3 0 0 - -  Change in appetite 3 0 0 - -  Feeling bad or failure about yourself  2 0 0 - -  Trouble concentrating 1 0 3 - -  Moving slowly or fidgety/restless 2 1 2  - -  Suicidal thoughts 0 0 0 - -  PHQ-9 Score 15 1 6  - -  Difficult doing work/chores - Somewhat difficult Not difficult at all - -    GAD 7 : Generalized Anxiety Score 04/17/2019 09/12/2018 12/02/2017  Nervous, Anxious, on Edge 2 1 1   Control/stop worrying 1 1 0  Worry too much - different things 1 1 1   Trouble relaxing 1 0 0  Restless 0 0 1  Easily annoyed or irritable 2 1 1   Afraid - awful might happen 1 1 0  Total GAD 7 Score 8 5 4   Anxiety Difficulty - Somewhat difficult Not difficult at all      Past Medical History:  Diagnosis Date  . ADD (attention deficit disorder) 01/29/2015  . Anxiety   .  Cervical high risk human papillomavirus (HPV) DNA test positive 01/05/2018  . Depression   . 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 Former cigarette smoker 12/02/2017  . Generalized anxiety disorder 06/25/2012   Child of alcmot   . Gestational diabetes mellitus (GDM) controlled on oral hypoglycemic drug 11/08/2016  . 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 RBBB 12/21/2014  . Syncope 12/21/2014   Past Surgical History:  Procedure Laterality Date  . CESAREAN SECTION N/A 11/09/2016   Procedure: CESAREAN SECTION;  Surgeon: Cheri Fowler, MD;  Location: [facility];  Service: Obstetrics;  Laterality: N/A;  . FRACTURE SURGERY     bilateral wrist   Social History   Tobacco Use  . Smoking status: Former Smoker    Packs/day: 0.50    Years: 7.00    Pack years: 3.50    Types: Cigarettes    Quit date: 07/15/2013    Years since quitting:  5.7  . Smokeless tobacco: Never Used  Substance Use Topics  . Alcohol use: Yes    Alcohol/week: 2.0 standard drinks    Types: 2 Standard drinks or equivalent per week    Comment: socially   family history includes Arrhythmia in her father.    ROS: negative except as noted in the HPI  Medications: Current Outpatient Medications  Medication Sig Dispense Refill  . sertraline (ZOLOFT) 100 MG tablet Take 1 tablet (100 mg total) by mouth daily. Take in addition to 50 mg tab for TDD 150 mg 90 tablet 0  . sertraline (ZOLOFT) 50 MG tablet Take 1 tablet (50 mg total) by mouth daily. Take in addition to 100 mg dose for TDD 150 mg 90 tablet 0  . busPIRone (BUSPAR) 5 MG tablet Take 1 tablet (5 mg total) by mouth 3 (three) times daily as needed. 60 tablet 0  . norethindrone (MICRONOR) 0.35 MG tablet Take 1 tablet (0.35 mg total) by mouth daily. 3 Package 3   No current facility-administered medications for this visit.    Allergies  Allergen Reactions  . Clonazepam Other (See Comments)    Memory loss       Objective:  BP 136/83   Pulse 76   Temp 98.6 F (37 C) (Oral)   Wt 183 lb (83 kg)   BMI 29.54 kg/m  Vitals:   04/17/19 1536 04/17/19 1544  BP: (!) 159/91 136/83  Pulse: 76   Temp: 98.6 F (37 C)    BP Readings from Last 3 Encounters:  04/17/19 136/83  09/12/18 (!) 143/83  05/23/18 131/82   Wt Readings from Last 3 Encounters:  04/17/19 183 lb (83 kg)  01/17/19 174 lb (78.9 kg)  09/12/18 177 lb (80.3 kg)    Gen:  alert, not ill-appearing, no distress, appropriate for age 65: head normocephalic without obvious abnormality, conjunctiva and cornea clear, trachea midline Pulm: Normal work of breathing, normal phonation, clear to auscultation bilaterally, no wheezes, rales or rhonchi CV: Normal rate, regular rhythm, s1 and s2 distinct, grade III/VI systolic ejection murmur heard best at the RUSB Neuro: alert and oriented x 3, no tremor MSK: extremities atraumatic,  normal gait and station, no peripheral edema Skin: intact, no rashes on exposed skin, no jaundice, no cyanosis Psych: cooperative, depressed mood, affect mood-congruent, she is tearful, speech is articulate, normal rate and volume; thought processes clear and goal-directed, normal judgment, good insight, no SI  Lab Results  Component Value Date   TSH 1.792 12/21/2014   Lab Results  Component Value Date   CREATININE 0.68 12/21/2014   BUN 10 12/21/2014   NA 137 12/21/2014   K 4.3 12/21/2014   CL 106 12/21/2014   CO2 25 12/21/2014     Study Result  CLINICAL DATA:  Assess for ARVC  EXAM: CARDIAC MRI  TECHNIQUE: The patient was scanned on a 1.5 Tesla GE magnet. A dedicated cardiac coil was used. Functional imaging was done using Fiesta sequences. 2,3, and 4 chamber views were done to assess for RWMA's. T1 sequences for tissue characterization. Modified Simpson's rule using a short axis stack was used to calculate an ejection fraction on a dedicated work Conservation officer, nature. The patient received 20 cc of Multihance. After 10 minutes inversion recovery sequences were used to assess for infiltration and scar tissue.  CONTRAST:  20 cc Multihance  FINDINGS: Limited images of the lung fields showed no gross abnormalities.  Normal left ventricular size and wall thickness. No evidence for hypertrophic cardiomyopathy. Normal wall motion, EF 63%. Normal right ventricular size and systolic function. No regional RV wall motion abnormalities and no localized RV aneurysmal segments. T1 images showed no evidence for fibrofatty infiltration of the RV myocardium. Normal atrial sizes. Trileaflet aortic valve with no stenosis or regurgitation. No significant mitral regurgitation though flow sequences to confirm were not done.  On delayed enhancement imaging, there was no late gadolinium enhancement (LGE) noted.  MEASUREMENTS: MEASUREMENTS LVEDV 129 mL  LV SV 82  mL  LV EF 63%  IMPRESSION: 1. Normal left ventricular size and systolic function, EF 61%. Normal wall motion. No evidence for hypertrophic cardiomyopathy.  2. Normal right ventricular size and systolic function. No evidence for ARVC.  3. No myocardial LGE so no definitive evidence for prior MI, infiltrative disease, or myocarditis.  Dalton Mclean   Electronically Signed   By: Loralie Champagne M.D.   On: 01/11/2015 09:34    Assessment and Plan: 33 y.o. female with   .Sejal was seen today for medication management.  Diagnoses and all orders for this visit:  Generalized anxiety disorder -     sertraline (ZOLOFT) 50 MG tablet; Take 1 tablet (50 mg total) by mouth daily. Take in addition to 100 mg dose for TDD 150 mg -     sertraline (ZOLOFT) 100 MG tablet; Take 1 tablet (100 mg total) by mouth daily. Take in addition to 50 mg tab for TDD 150 mg -     busPIRone (BUSPAR) 5 MG tablet; Take 1 tablet (5 mg total) by mouth 3 (three) times daily as needed.  Marital stress  Attention deficit disorder (ADD) in adult  Hypertension goal BP (blood pressure) < 130/80 -     COMPLETE METABOLIC PANEL WITH GFR -     TSH + free T4 -     CBC -     Urinalysis, Routine w reflex microscopic -     Lipid Panel w/reflex Direct LDL  Screening for thyroid disorder -     TSH + free T4  Screening for lipid disorders -     Lipid Panel w/reflex Direct LDL  Screening for blood or protein in urine -     Urinalysis, Routine w reflex microscopic  Screening for blood disease -     COMPLETE METABOLIC PANEL WITH GFR -     CBC  Systolic ejection murmur -     ECHOCARDIOGRAM COMPLETE; Future  Palpitations -  ECHOCARDIOGRAM COMPLETE; Future  History of syncope -     ECHOCARDIOGRAM COMPLETE; Future  Encounter for contraceptive management, unspecified type -     norethindrone (MICRONOR) 0.35 MG tablet; Take 1 tablet (0.35 mg total) by mouth daily.  Exercise intolerance -      ECHOCARDIOGRAM COMPLETE; Future  GAD, Depression, Marital stress PHQ9=15, increased from 1 GAD7=8, increased from 5 Increasing Sertraline from 125 mg to 150 mg Adding Buspar 5 mg tid prn (patient would like to avoid benzodiazepines) She has already reached out to a counselor  Adult ADHD Avoiding stimulants and Wellbutrin for now until HTN under control  Murmur, Exercise Intolerance No syncopal episodes since 2016 Echo pending  HTN Blood pressure elevated on 2 checks in office today, first reading was in stage 2 hypertensive range. Also appreciated a grade III/VI systolic ejection murmur heard best at the RUSB. She has a history of syncope and ectopic atrial rhythm and was evaluated by cardiology in 2016 for this.  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  Will continue to hold Concerta and avoid stimulants We also discussed switching from OCP to mini-pill  TSH, CMP, UA pending Patient will purchase home BP cuff and monitor twice daily at home. Send readings via MyChart in 2 weeks  Patient education and anticipatory guidance given Patient agrees with treatment plan Follow-up in 1 month or sooner as needed if symptoms worsen or fail to improve  Darlyne Russian PA-C

## 2019-04-17 NOTE — Patient Instructions (Addendum)
For your blood pressure: - Goal <130/80 (Ideally 120's/70's) - monitor and log blood pressures at home - check around the same time each day in a relaxed setting - Limit salt to <2500 mg/day - Follow DASH (Dietary Approach to Stopping Hypertension) eating plan - Try to get at least 150 minutes of aerobic exercise per week - Aim to go on a brisk walk 30 minutes per day at least 5 days per week. If you're not active, gradually increase how long you walk by 5 minutes each week - limit alcohol: 2 standard drinks per day for men and 1 per day for women - avoid tobacco/nicotine products. Consider smoking cessation if you smoke - weight loss: 7% of current body weight can reduce your blood pressure by 5-10 points - follow-up at least every 6 months for your blood pressure. Follow-up sooner if your BP is not controlled   Living With Anxiety  After being diagnosed with an anxiety disorder, you may be relieved to know why you have felt or behaved a certain way. It is natural to also feel overwhelmed about the treatment ahead and what it will mean for your life. With care and support, you can manage this condition and recover from it. How to cope with anxiety Dealing with stress Stress is your bodys reaction to life changes and events, both good and bad. Stress can last just a few hours or it can be ongoing. Stress can play a major role in anxiety, so it is important to learn both how to cope with stress and how to think about it differently. Talk with your health care provider or a counselor to learn more about stress reduction. He or she may suggest some stress reduction techniques, such as:  Music therapy. This can include creating or listening to music that you enjoy and that inspires you.  Mindfulness-based meditation. This involves being aware of your normal breaths, rather than trying to control your breathing. It can be done while sitting or walking.  Centering prayer. This is a kind of  meditation that involves focusing on a word, phrase, or sacred image that is meaningful to you and that brings you peace.  Deep breathing. To do this, expand your stomach and inhale slowly through your nose. Hold your breath for 3-5 seconds. Then exhale slowly, allowing your stomach muscles to relax.  Self-talk. This is a skill where you identify thought patterns that lead to anxiety reactions and correct those thoughts.  Muscle relaxation. This involves tensing muscles then relaxing them. Choose a stress reduction technique that fits your lifestyle and personality. Stress reduction techniques take time and practice. Set aside 5-15 minutes a day to do them. Therapists can offer training in these techniques. The training may be covered by some insurance plans. Other things you can do to manage stress include:  Keeping a stress diary. This can help you learn what triggers your stress and ways to control your response.  Thinking about how you respond to certain situations. You may not be able to control everything, but you can control your reaction.  Making time for activities that help you relax, and not feeling guilty about spending your time in this way. Therapy combined with coping and stress-reduction skills provides the best chance for successful treatment. Medicines Medicines can help ease symptoms. Medicines for anxiety include:  Anti-anxiety drugs.  Antidepressants.  Beta-blockers. Medicines may be used as the main treatment for anxiety disorder, along with therapy, or if other treatments are not working.  Medicines should be prescribed by a health care provider. Relationships Relationships can play a big part in helping you recover. Try to spend more time connecting with trusted friends and family members. Consider going to couples counseling, taking family education classes, or going to family therapy. Therapy can help you and others better understand the condition. How to recognize  changes in your condition Everyone has a different response to treatment for anxiety. Recovery from anxiety happens when symptoms decrease and stop interfering with your daily activities at home or work. This may mean that you will start to:  Have better concentration and focus.  Sleep better.  Be less irritable.  Have more energy.  Have improved memory. It is important to recognize when your condition is getting worse. Contact your health care provider if your symptoms interfere with home or work and you do not feel like your condition is improving. Where to find help and support: You can get help and support from these sources:  Self-help groups.  Online and [employer].  A trusted spiritual leader.  Couples counseling.  Family education classes.  Family therapy. Follow these instructions at home:  Eat a healthy diet that includes plenty of vegetables, fruits, whole grains, low-fat dairy products, and lean protein. Do not eat a lot of foods that are high in solid fats, added sugars, or salt.  Exercise. Most adults should do the following: ? Exercise for at least 150 minutes each week. The exercise should increase your heart rate and make you sweat (moderate-intensity exercise). ? Strengthening exercises at least twice a week.  Cut down on caffeine, tobacco, alcohol, and other potentially harmful substances.  Get the right amount and quality of sleep. Most adults need 7-9 hours of sleep each night.  Make choices that simplify your life.  Take over-the-counter and prescription medicines only as told by your health care provider.  Avoid caffeine, alcohol, and certain over-the-counter cold medicines. These may make you feel worse. Ask your pharmacist which medicines to avoid.  Keep all follow-up visits as told by your health care provider. This is important. Questions to ask your health care provider  Would I benefit from therapy?  How often should I follow  up with a health care provider?  How long do I need to take medicine?  Are there any long-term side effects of my medicine?  Are there any alternatives to taking medicine? Contact a health care provider if:  You have a hard time staying focused or finishing daily tasks.  You spend many hours a day feeling worried about everyday life.  You become exhausted by worry.  You start to have headaches, feel tense, or have nausea.  You urinate more than normal.  You have diarrhea. Get help right away if:  You have a racing heart and shortness of breath.  You have thoughts of hurting yourself or others. If you ever feel like you may hurt yourself or others, or have thoughts about taking your own life, get help right away. You can go to your nearest emergency department or call:  Your local emergency services (911 in the [place]  A suicide crisis helpline, such as the [facility] at 770-482-7779. This is open 24-hours a day. Summary  Taking steps to deal with stress can help calm you.  Medicines cannot cure anxiety disorders, but they can help ease symptoms.  Family, friends, and partners can play a big part in helping you recover from an anxiety disorder. This information is  not intended to replace advice given to you by your health care provider. Make sure you discuss any questions you have with your health care provider. Document Released: 09/22/2016 Document Revised: 09/10/2017 Document Reviewed: 09/22/2016 Elsevier Patient Education  [place] A heart murmur is an extra sound that is caused by chaotic blood flow through the valves of the heart. The murmur can be heard as a "hum" or "whoosh" sound when blood flows through the heart. There are two types of heart murmurs:  Innocent (benign) murmurs. Most people with this type of heart murmur do not have a heart problem. Many children have innocent heart murmurs. Your health care  provider may suggest some basic tests to find out whether your murmur is an innocent murmur. If an innocent heart murmur is found, there is no need for further tests or treatment and no need to restrict activities or stop playing sports.  Abnormal murmurs. These types of murmurs can occur in children and adults. Abnormal murmurs may be a sign of a more serious heart condition, such as a heart defect present at birth (congenital defect) or heart valve disease. What are the causes?  The heart has four areas called chambers. Valves separate the upper and lower chambers from each other (tricuspid valve and mitral valve) and separate the lower chambers of the heart from pathways that lead away from the heart (aortic valve and pulmonary valve). Normally, the valves open to let blood flow through or out of your heart, and then they shut to keep the blood from flowing backward. This condition is caused by heart valves that are not working properly.  In children, abnormal heart murmurs are typically caused by congenital defects.  In adults, abnormal murmurs are usually caused by heart valve problems from disease, infection, or aging. This condition may also be caused by:  Pregnancy.  Fever.  Overactive thyroid gland.  Anemia.  Exercise.  Rapid growth spurts (in children). What are the signs or symptoms? Innocent murmurs do not cause symptoms, and many people with abnormal murmurs may not have symptoms. If symptoms do develop, they may include:  Shortness of breath.  Blue coloring of the skin, especially on the fingertips.  Chest pain.  Palpitations, or feeling a fluttering or skipped heartbeat.  Fainting.  Persistent cough.  Getting tired much faster than expected.  Swelling in the abdomen, feet, or ankles. How is this diagnosed? This condition may be diagnosed during a routine physical or other exam. If your health care provider hears a murmur with a stethoscope, he or she will  listen for:  Where the murmur is located in your heart.  How long the murmur lasts (duration).  When the murmur is heard during the heartbeat.  How loud the murmur is. This may help the health care provider figure out what is causing the murmur. You may be referred to a heart specialist (cardiologist). You may also have other tests, including:  Electrocardiogram (ECG or EKG). This test measures the electrical activity of your heart.  Echocardiogram. This test uses high frequency sound waves to make pictures of your heart.  MRI or chest X-ray.  Cardiac catheterization. This test looks at blood flow through the arteries around the heart. For children and adults who have an abnormal heart murmur and want to stay active, it is important to:  Complete testing.  Review test results.  Receive recommendations from your health care provider. If heart disease is present, it may not be  safe to play or be active. How is this treated? Heart murmurs themselves do not need treatment. In some cases, a heart murmur may go away on its own. If an underlying problem or disease is causing the murmur, you may need treatment. If treatment is needed, it will depend on the type and severity of the disease or heart problem causing the murmur. Treatment may include:  Medicine.  Surgery.  Dietary and lifestyle changes. Follow these instructions at home:  Talk with your health care provider before participating in sports or other activities that require a lot of effort and energy (are strenuous).  Learn as much as possible about your condition and any related diseases. Ask your health care provider if you may be at risk for any medical emergencies.  Talk with your health care provider about what symptoms you should look out for.  It is up to you to get your test results. Ask your health care provider, or the department that is doing the test, when your results will be ready.  Keep all follow-up visits  as told by your health care provider. This is important. Contact a health care provider if:  You are frequently short of breath.  You feel more tired than usual.  You are having a hard time keeping up with normal activities or fitness routines.  You have swelling in your ankles or feet.  You notice that your heart often beats irregularly.  You develop any new symptoms. Get help right away if:  You have chest pain.  You are having trouble breathing.  You feel light-headed or you pass out.  Your symptoms suddenly get worse. These symptoms may represent a serious problem that is an emergency. Do not wait to see if the symptoms will go away. Get medical help right away. Call your local emergency services (911 in the [place] Do not drive yourself to the hospital. Summary  Normally, the heart valves open to let blood flow through or out of your heart, and then they shut to keep the blood from flowing backward.  A heart murmur is caused by heart valves that are not working properly.  You may need treatment if an underlying problem or disease is causing the heart murmur. Treatment may include medicine, surgery, or dietary and lifestyle changes.  Talk with your health care provider before participating in sports or other activities that require a lot of effort and energy (are strenuous).  Talk with your health care provider about what symptoms you should watch out for. This information is not intended to replace advice given to you by your health care provider. Make sure you discuss any questions you have with your health care provider. Document Released: 11/05/2004 Document Revised: 03/22/2018 Document Reviewed: 03/22/2018 Elsevier Patient Education  2020 [employer].

## 2019-04-18 LAB — CBC
HCT: 38.6 % (ref 35.0–45.0)
Hemoglobin: 13.5 g/dL (ref 11.7–15.5)
MCH: 31.9 pg (ref 27.0–33.0)
MCHC: 35 g/dL (ref 32.0–36.0)
MCV: 91.3 fL (ref 80.0–100.0)
MPV: 10.8 fL (ref 7.5–12.5)
Platelets: 243 10*3/uL (ref 140–400)
RBC: 4.23 10*6/uL (ref 3.80–5.10)
RDW: 11.9 % (ref 11.0–15.0)
WBC: 6.3 10*3/uL (ref 3.8–10.8)

## 2019-04-18 LAB — COMPLETE METABOLIC PANEL WITH GFR
AG Ratio: 1.6 (calc) (ref 1.0–2.5)
ALT: 14 U/L (ref 6–29)
AST: 14 U/L (ref 10–30)
Albumin: 4.4 g/dL (ref 3.6–5.1)
Alkaline phosphatase (APISO): 50 U/L (ref 31–125)
BUN: 16 mg/dL (ref 7–25)
CO2: 26 mmol/L (ref 20–32)
Calcium: 9.8 mg/dL (ref 8.6–10.2)
Chloride: 104 mmol/L (ref 98–110)
Creat: 0.68 mg/dL (ref 0.50–1.10)
GFR, Est African American: 134 mL/min/{1.73_m2} (ref >=60)
GFR, Est Non African American: 116 mL/min/{1.73_m2} (ref >=60)
Globulin: 2.8 g/dL (calc) (ref 1.9–3.7)
Glucose, Bld: 117 mg/dL (ref 65–139)
Potassium: 3.9 mmol/L (ref 3.5–5.3)
Sodium: 139 mmol/L (ref 135–146)
Total Bilirubin: 0.3 mg/dL (ref 0.2–1.2)
Total Protein: 7.2 g/dL (ref 6.1–8.1)

## 2019-04-18 LAB — LIPID PANEL W/REFLEX DIRECT LDL
Cholesterol: 166 mg/dL (ref <200)
HDL: 50 mg/dL (ref >=50)
LDL Cholesterol (Calc): 77 mg/dL (calc)
Non-HDL Cholesterol (Calc): 116 mg/dL (calc) (ref <130)
Total CHOL/HDL Ratio: 3.3 (calc) (ref <5.0)
Triglycerides: 324 mg/dL — ABNORMAL HIGH (ref <150)

## 2019-04-18 LAB — URINALYSIS, ROUTINE W REFLEX MICROSCOPIC
Bilirubin Urine: NEGATIVE
Glucose, UA: NEGATIVE
Hgb urine dipstick: NEGATIVE
Ketones, ur: NEGATIVE
Leukocytes,Ua: NEGATIVE
Nitrite: NEGATIVE
Protein, ur: NEGATIVE
Specific Gravity, Urine: 1.011 (ref 1.001–1.03)
pH: 7.5 (ref 5.0–8.0)

## 2019-04-18 LAB — TSH+FREE T4: TSH W/REFLEX TO FT4: 1.31 mIU/L

## 2019-04-25 ENCOUNTER — Other Ambulatory Visit (HOSPITAL_COMMUNITY): Payer: BC Managed Care – PPO

## 2019-05-01 ENCOUNTER — Encounter: Payer: Self-pay | Admitting: Physician Assistant

## 2019-05-01 DIAGNOSIS — F411 Generalized anxiety disorder: Secondary | ICD-10-CM

## 2019-05-01 MED ORDER — BUSPIRONE HCL 5 MG PO TABS: 5.0000 mg | ORAL_TABLET | Freq: Three times a day (TID) | ORAL | 0 refills | Status: DC | PRN

## 2019-05-01 NOTE — Telephone Encounter (Signed)
FYI for attached readings.   Also, RX pended, please send if appropriate

## 2019-05-02 ENCOUNTER — Other Ambulatory Visit (HOSPITAL_COMMUNITY): Payer: BC Managed Care – PPO

## 2019-05-24 ENCOUNTER — Telehealth (HOSPITAL_COMMUNITY): Payer: Self-pay

## 2019-05-24 NOTE — Telephone Encounter (Signed)
New message    Just an FYI. We have made several attempts to contact this patient including sending a letter to schedule or reschedule their echocardiogram. We will be removing the patient from the echo WQ.   Thank you.  8.12.20 @ 3:58pm lm on home vm Zoe Wiley  7.21.20 no show  7.6.20 @ 4:22pm spoke with patient call back tomorrow - Zoe Wiley

## 2019-05-25 NOTE — Telephone Encounter (Signed)
Noted! Thank you

## 2019-05-26 ENCOUNTER — Other Ambulatory Visit: Payer: Self-pay | Admitting: Osteopathic Medicine

## 2019-05-26 DIAGNOSIS — F411 Generalized anxiety disorder: Secondary | ICD-10-CM

## 2019-05-26 MED ORDER — BUSPIRONE HCL 5 MG PO TABS: 5.0000 mg | ORAL_TABLET | Freq: Three times a day (TID) | ORAL | 0 refills | Status: DC | PRN

## 2019-06-05 ENCOUNTER — Other Ambulatory Visit: Payer: Self-pay

## 2019-06-05 DIAGNOSIS — F411 Generalized anxiety disorder: Secondary | ICD-10-CM

## 2019-06-05 MED ORDER — SERTRALINE HCL 50 MG PO TABS: 50.0000 mg | ORAL_TABLET | Freq: Every day | ORAL | 0 refills | Status: DC

## 2019-06-05 MED ORDER — SERTRALINE HCL 100 MG PO TABS: 100.0000 mg | ORAL_TABLET | Freq: Every day | ORAL | 0 refills | Status: DC

## 2019-06-05 MED ORDER — BUSPIRONE HCL 5 MG PO TABS: 5.0000 mg | ORAL_TABLET | Freq: Three times a day (TID) | ORAL | 0 refills | Status: DC | PRN

## 2019-07-12 ENCOUNTER — Encounter: Payer: Self-pay | Admitting: Physician Assistant

## 2019-07-17 ENCOUNTER — Other Ambulatory Visit: Payer: Self-pay | Admitting: Physician Assistant

## 2019-07-17 DIAGNOSIS — F411 Generalized anxiety disorder: Secondary | ICD-10-CM

## 2019-07-17 NOTE — Telephone Encounter (Signed)
Last filled 06/05/2019 #180 with no refills. To take TID PRN. It also looks like patient is pregnant. Please advise on refill.

## 2019-07-18 ENCOUNTER — Encounter: Payer: Self-pay | Admitting: Advanced Practice Midwife

## 2019-07-18 ENCOUNTER — Other Ambulatory Visit: Payer: Self-pay

## 2019-07-18 ENCOUNTER — Ambulatory Visit (INDEPENDENT_AMBULATORY_CARE_PROVIDER_SITE_OTHER): Payer: BC Managed Care – PPO | Admitting: Advanced Practice Midwife

## 2019-07-18 ENCOUNTER — Other Ambulatory Visit: Payer: BC Managed Care – PPO

## 2019-07-18 VITALS — BP 122/72 | HR 72 | Wt 189.0 lb

## 2019-07-18 DIAGNOSIS — Z3481 Encounter for supervision of other normal pregnancy, first trimester: Secondary | ICD-10-CM | POA: Diagnosis not present

## 2019-07-18 DIAGNOSIS — Z1151 Encounter for screening for human papillomavirus (HPV): Secondary | ICD-10-CM | POA: Diagnosis not present

## 2019-07-18 DIAGNOSIS — Z124 Encounter for screening for malignant neoplasm of cervix: Secondary | ICD-10-CM

## 2019-07-18 DIAGNOSIS — Z3482 Encounter for supervision of other normal pregnancy, second trimester: Secondary | ICD-10-CM | POA: Diagnosis not present

## 2019-07-18 DIAGNOSIS — Z113 Encounter for screening for infections with a predominantly sexual mode of transmission: Secondary | ICD-10-CM

## 2019-07-18 DIAGNOSIS — Z3A13 13 weeks gestation of pregnancy: Secondary | ICD-10-CM | POA: Diagnosis not present

## 2019-07-18 DIAGNOSIS — Z31430 Encounter of female for testing for genetic disease carrier status for procreative management: Secondary | ICD-10-CM | POA: Diagnosis not present

## 2019-07-18 DIAGNOSIS — Z3480 Encounter for supervision of other normal pregnancy, unspecified trimester: Secondary | ICD-10-CM | POA: Insufficient documentation

## 2019-07-18 NOTE — Progress Notes (Signed)
Subjective:   Zoe Wiley is a 33 y.o. G3P1011 at [redacted]w[redacted]d by LMP, c/w early ultrasound being seen today for her first obstetrical visit. This is an unplanned pregnancy, but the patient verbalizes that she is happy about it. Her obstetrical history is significant for gestational diabetes mellitus and previous c/s. Patient does intend to breast feed. Pregnancy history fully reviewed.  Patient reports no complaints.  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   Former cigarette smoker 12/02/2017   Generalized anxiety disorder 06/25/2012   Child of alcmot    Gestational diabetes mellitus (GDM) controlled on oral hypoglycemic drug 11/08/2016   LGSIL on Pap smear of cervix 12/02/2017   LOW-GRADE SQUAMOUS INTRAEPITHELIAL LESION (LSIL) ENCOMPASSING HPV/MILD DYSPLASIA/CIN 1 WITH A FEW CELLS SUSPICIOUS FOR A HIGH-GRADE LESION IDENTIFIED   RBBB 12/21/2014   Syncope 12/21/2014   Past Surgical History:  Procedure Laterality Date   CESAREAN SECTION N/A 11/09/2016   Procedure: CESAREAN SECTION;  Surgeon: Cheri Fowler, MD;  Location: [place];  Service: Obstetrics;  Laterality: N/A;   FRACTURE SURGERY     bilateral wrist   Family History  Problem Relation Age of Onset   Arrhythmia Father        atrial fibrillation   Hypertension Father    Breast cancer Paternal Grandmother    Lung cancer Paternal Grandfather    Social History   Tobacco Use   Smoking status:  Former Smoker    Packs/day: 0.50    Years: 7.00    Pack years: 3.50    Types: Cigarettes    Quit date: 07/15/2013    Years since quitting: 6.0   Smokeless tobacco: Never Used  Substance Use Topics   Alcohol use: Not Currently    Alcohol/week: 2.0 standard drinks    Types: 2 Standard drinks or equivalent per week    Comment: socially   Drug use: No   Allergies  Allergen Reactions   Clonazepam Other (See Comments)    Memory loss   Current Outpatient Medications on File Prior to Visit  Medication Sig Dispense Refill   busPIRone (BUSPAR) 5 MG tablet Take 5 mg by mouth as needed.     Prenatal Vit-Fe Fumarate-FA (PRENATAL VITAMIN PO) Take by mouth.     sertraline (ZOLOFT) 100 MG tablet Take 100 mg by mouth daily. Taking 150 mg daily     No current facility-administered medications on file prior to visit.     Indications for ASA therapy (per uptodate) One of the following: Previous pregnancy with preeclampsia, especially early onset and with an adverse outcome No Multifetal gestation No Chronic hypertension No Type 1 or 2 diabetes mellitus No Chronic kidney disease No Autoimmune disease (antiphospholipid syndrome, systemic  lupus erythematosus) No  Two or more of the following: Nulliparity No Obesity (body mass index >30 kg/m2) Yes Family history of preeclampsia in mother or sister No Age ?35 years No Sociodemographic characteristics (African American race, low socioeconomic level) No Personal risk factors (eg, previous pregnancy with low birth weight or small for gestational age infant, previous adverse pregnancy outcome [eg, stillbirth], interval >10 years between pregnancies) No  Indications for early 1 hour GTT (per uptodate)  BMI >25 (>23 in Asian women) AND one of the following  Gestational diabetes mellitus in a previous pregnancy Yes Glycated hemoglobin ?5.7 percent (39 mmol/mol), impaired glucose tolerance, or impaired fasting glucose on previous testing  No First-degree relative with diabetes No High-risk race/ethnicity (eg, African American, Latino, Native American, [place] American, Pacific Islander) No History of cardiovascular disease No Hypertension or on therapy for hypertension No High-density lipoprotein cholesterol level <35 mg/dL (0.90 mmol/L) and/or a triglyceride level >250 mg/dL (2.82 mmol/L) No Polycystic ovary syndrome No Physical inactivity Yes Other clinical condition associated with insulin resistance (eg, severe obesity, acanthosis nigricans) No Previous birth of an infant weighing ?4000 g No Previous stillbirth of unknown cause No   Exam   Vitals:   07/18/19 1445  BP: 122/72  Pulse: 72  Weight: 85.7 kg      Uterus:     Pelvic Exam: Perineum: no hemorrhoids, normal perineum   Vulva: normal external genitalia, no lesions   Vagina:  normal mucosa, normal discharge   Cervix: no lesions and normal, pap smear obtained    Adnexa: normal adnexa and no mass, fullness, tenderness   Bony Pelvis: average  System: General: well-developed, well-nourished female in no acute distress   Breast:  normal appearance, no masses or tenderness   Skin: normal coloration and turgor, no rashes   Neurologic: oriented, normal, negative, normal mood   Extremities: normal strength, tone, and muscle mass, ROM of all joints is normal   HEENT PERRLA, extraocular movement intact and sclera clear, anicteric   Mouth/Teeth mucous membranes moist, pharynx normal without lesions and dental hygiene good   Neck supple and no masses   Cardiovascular: regular rate and rhythm   Respiratory:  no respiratory distress, normal breath sounds   Abdomen: soft, non-tender; bowel sounds normal; no masses,  no organomegaly     Assessment:   Pregnancy: G3P1011 Patient Active Problem List   Diagnosis Date Noted   Supervision of other normal pregnancy, antepartum 07/18/2019   Hypertension goal BP (blood pressure) < 130/80 04/17/2019   Palpitations  Q000111Q   Systolic ejection murmur Q000111Q   History of syncope 04/17/2019   Marital stress 04/17/2019   Exercise intolerance 04/17/2019   Encounter for contraceptive management 04/17/2019   Hypertensive response to exercise 09/14/2018   Elevated blood pressure reading 09/12/2018   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 Supervision of other normal pregnancy, antepartum -- Pt here for new OB -- This is an unplanned pregnancy, but pt is very excited -- No complaints today -- New OB labs, A1C, PCR, CMP and PAP  today -- Anticipatory guidance for upcoming appointments   - Cytology - PAP( [place]) - Obstetric panel - HIV antibody (with reflex) - Culture, OB Urine - Babyscripts Schedule Optimization - Comprehensive metabolic panel - Protein / creatinine ratio, urine - Genetic Screening - HgB A1c   Initial labs drawn. Early 1 hour GTT today Continue prenatal vitamins. Genetic Screening discussed, NIPS: ordered. Ultrasound discussed; fetal anatomic survey: requested; will order at next exam. Problem list reviewed and updated The nature of [place] [facility] with multiple MDs and other Advanced Practice Providers was explained to patient; also emphasized that fellows, residents, and students are part of our team. Routine obstetric precautions reviewed  Return in about 4 weeks (around 08/15/2019).   Renee Harder, SNM 4:57 PM 07/18/19

## 2019-07-18 NOTE — Patient Instructions (Signed)

## 2019-07-18 NOTE — Progress Notes (Signed)
Bedside U/S shows single IUP with HC measuring 9.53 cm  GA is [redacted]w[redacted]d   FHR is 156 BPM.  Pt was on OCP when she conceived

## 2019-07-19 LAB — OBSTETRIC PANEL
Absolute Monocytes: 425 cells/uL (ref 200–950)
Antibody Screen: NOT DETECTED
Basophils Absolute: 50 cells/uL (ref 0–200)
Basophils Relative: 0.7 %
Eosinophils Absolute: 58 cells/uL (ref 15–500)
Eosinophils Relative: 0.8 %
HCT: 36.9 % (ref 35.0–45.0)
Hemoglobin: 12.5 g/dL (ref 11.7–15.5)
Hepatitis B Surface Ag: NONREACTIVE
Lymphs Abs: 1598 cells/uL (ref 850–3900)
MCH: 30.9 pg (ref 27.0–33.0)
MCHC: 33.9 g/dL (ref 32.0–36.0)
MCV: 91.3 fL (ref 80.0–100.0)
MPV: 11.3 fL (ref 7.5–12.5)
Monocytes Relative: 5.9 %
Neutro Abs: 5069 cells/uL (ref 1500–7800)
Neutrophils Relative %: 70.4 %
Platelets: 234 10*3/uL (ref 140–400)
RBC: 4.04 10*6/uL (ref 3.80–5.10)
RDW: 11.9 % (ref 11.0–15.0)
RPR Ser Ql: NONREACTIVE
Rubella: 1.26 index
Total Lymphocyte: 22.2 %
WBC: 7.2 10*3/uL (ref 3.8–10.8)

## 2019-07-19 LAB — PROTEIN / CREATININE RATIO, URINE
Creatinine, Urine: 20 mg/dL (ref 20–275)
Protein/Creat Ratio: 250 mg/g creat — ABNORMAL HIGH (ref 21–161)
Protein/Creatinine Ratio: 0.25 mg/mg creat — ABNORMAL HIGH (ref 0.021–0.16)
Total Protein, Urine: 5 mg/dL (ref 5–24)

## 2019-07-19 LAB — COMPREHENSIVE METABOLIC PANEL
AG Ratio: 1.6 (calc) (ref 1.0–2.5)
ALT: 7 U/L (ref 6–29)
AST: 11 U/L (ref 10–30)
Albumin: 4.1 g/dL (ref 3.6–5.1)
Alkaline phosphatase (APISO): 39 U/L (ref 31–125)
BUN: 7 mg/dL (ref 7–25)
CO2: 25 mmol/L (ref 20–32)
Calcium: 9.4 mg/dL (ref 8.6–10.2)
Chloride: 101 mmol/L (ref 98–110)
Creat: 0.51 mg/dL (ref 0.50–1.10)
Globulin: 2.5 g/dL (calc) (ref 1.9–3.7)
Glucose, Bld: 88 mg/dL (ref 65–99)
Potassium: 3.6 mmol/L (ref 3.5–5.3)
Sodium: 136 mmol/L (ref 135–146)
Total Bilirubin: 0.2 mg/dL (ref 0.2–1.2)
Total Protein: 6.6 g/dL (ref 6.1–8.1)

## 2019-07-19 LAB — HEMOGLOBIN A1C
Hgb A1c MFr Bld: 4.5 % of total Hgb (ref <5.7)
Mean Plasma Glucose: 82 (calc)
eAG (mmol/L): 4.6 (calc)

## 2019-07-19 LAB — EXTRA SPECIMEN

## 2019-07-19 LAB — EXTRA URINE SPECIMEN

## 2019-07-19 LAB — HIV ANTIBODY (ROUTINE TESTING W REFLEX): HIV 1&2 Ab, 4th Generation: NONREACTIVE

## 2019-07-20 LAB — URINE CULTURE, OB REFLEX

## 2019-07-20 LAB — CULTURE, OB URINE

## 2019-07-25 LAB — CYTOLOGY - PAP
Chlamydia: NEGATIVE
Comment: NEGATIVE
Comment: NEGATIVE
Comment: NORMAL
Diagnosis: NEGATIVE
High risk HPV: NEGATIVE
Neisseria Gonorrhea: NEGATIVE

## 2019-08-15 ENCOUNTER — Telehealth (INDEPENDENT_AMBULATORY_CARE_PROVIDER_SITE_OTHER): Payer: BC Managed Care – PPO | Admitting: Advanced Practice Midwife

## 2019-08-15 VITALS — BP 131/80

## 2019-08-15 DIAGNOSIS — Z20828 Contact with and (suspected) exposure to other viral communicable diseases: Secondary | ICD-10-CM | POA: Diagnosis not present

## 2019-08-15 DIAGNOSIS — Z3A17 17 weeks gestation of pregnancy: Secondary | ICD-10-CM

## 2019-08-15 DIAGNOSIS — Z8632 Personal history of gestational diabetes: Secondary | ICD-10-CM

## 2019-08-15 DIAGNOSIS — R6889 Other general symptoms and signs: Secondary | ICD-10-CM

## 2019-08-15 DIAGNOSIS — Z3480 Encounter for supervision of other normal pregnancy, unspecified trimester: Secondary | ICD-10-CM

## 2019-08-15 DIAGNOSIS — Z8759 Personal history of other complications of pregnancy, childbirth and the puerperium: Secondary | ICD-10-CM | POA: Insufficient documentation

## 2019-08-15 MED ORDER — ASPIRIN EC 81 MG PO TBEC: 81.0000 mg | DELAYED_RELEASE_TABLET | Freq: Every day | ORAL | 5 refills | Status: DC

## 2019-08-15 NOTE — Progress Notes (Signed)
[place] VIRTUAL VIDEO VISIT ENCOUNTER NOTE  Provider location: [facility] [place] [facility] [place]   I connected with Zoe Wiley on 08/15/19 at  2:15 PM EST by MyChart Video Encounter at home and verified that I am speaking with the correct person using two identifiers.   I discussed the limitations, risks, security and privacy concerns of performing an evaluation and management service virtually and the availability of in person appointments. I also discussed with the patient that there may be a patient responsible charge related to this service. The patient expressed understanding and agreed to proceed. Subjective:  Zoe Wiley is a 33 y.o. G3P1011 at [redacted]w[redacted]d being seen today for ongoing prenatal care.  She is currently monitored for the following issues for this low-risk pregnancy and has Generalized anxiety disorder; Syncope; RBBB; Ectopic atrial rhythm; Attention deficit disorder (ADD) in adult; Former cigarette smoker; History of one miscarriage; History of gestational diabetes; Depression with anxiety; Vaginal odor; Cervical high risk human papillomavirus (HPV) DNA test positive; CIN III with severe dysplasia; Elevated blood pressure reading; Hypertensive response to exercise; Hypertension goal BP (blood pressure) < 130/80; Palpitations; Systolic ejection murmur; History of syncope; Marital stress; Exercise intolerance; Encounter for contraceptive management; and Supervision of other normal pregnancy, antepartum on their problem list.  Patient reports no complaints.  Contractions: Not present. Vag. Bleeding: None.  Movement: Absent. Denies any leaking of fluid.   The following portions of the patient's history were reviewed and updated as appropriate: allergies, current medications, past family history, past medical history, past social history, past surgical history and problem list.   Objective:   Vitals:   08/15/19 1412  BP: 131/80    Fetal  Status:     Movement: Absent     General:  Alert, oriented and cooperative. Patient is in no acute distress.  Respiratory: Normal respiratory effort, no problems with respiration noted  Mental Status: Normal mood and affect. Normal behavior. Normal judgment and thought content.  Rest of physical exam deferred due to type of encounter  Imaging: No results found.  Assessment and Plan:  Pregnancy: G3P1011 at [redacted]w[redacted]d  1. Supervision of other normal pregnancy, antepartum --Anticipatory guidance about next visits/weeks of pregnancy given.  - [place] MFM OB COMP + 14 WK; Future --Next visit at 21 weeks  2. Flu-like symptoms --Pt called to report some cough/congestion. She had virtual PCP visit and went for COVID swab today which is pending. --Visit today rescheduled as virtual visit. Pt to have AFP at next visit at 21 weeks.  3. History of gestational hypertension --Pt reported elevated BP with her previous pregnancy "toward the end of pregnancy" and for a few weeks postpartum --Baseline PEC labs done 07/18/19 were wnl, P/C ratio was 0.25 --Start pt on BASA   4. History of gestational diabetes --A1C at new OB was normal   Preterm labor symptoms and general obstetric precautions including but not limited to vaginal bleeding, contractions, leaking of fluid and fetal movement were reviewed in detail with the patient. I discussed the assessment and treatment plan with the patient. The patient was provided an opportunity to ask questions and all were answered. The patient agreed with the plan and demonstrated an understanding of the instructions. The patient was advised to call back or seek an in-person office evaluation/go to [facility] at [facility] for any urgent or concerning symptoms. Please refer to After Visit Summary for other counseling recommendations.   I provided 15 minutes of face-to-face  time during this encounter.  No follow-ups on file.  No future appointments.  Zoe Wiley, [place] for [employer], [place]

## 2019-08-16 ENCOUNTER — Other Ambulatory Visit: Payer: Self-pay | Admitting: Physician Assistant

## 2019-08-16 DIAGNOSIS — F411 Generalized anxiety disorder: Secondary | ICD-10-CM

## 2019-08-16 NOTE — Telephone Encounter (Signed)
She has 90 tablets with 3 refills that was sent 2 months ago. Can we call patient and see what she actually needs?

## 2019-08-18 MED ORDER — SERTRALINE HCL 100 MG PO TABS: 100.0000 mg | ORAL_TABLET | Freq: Every day | ORAL | 0 refills | Status: DC

## 2019-08-18 MED ORDER — SERTRALINE HCL 50 MG PO TABS: 50.0000 mg | ORAL_TABLET | Freq: Every day | ORAL | 0 refills | Status: DC

## 2019-08-18 NOTE — Telephone Encounter (Signed)
Perfect

## 2019-08-18 NOTE — Addendum Note (Signed)
Addended by: Towana Badger on: 08/18/2019 01:43 PM   Modules accepted: Orders

## 2019-08-18 NOTE — Telephone Encounter (Signed)
Patient switching to mail order. Needs RF sent.   I have sent RX for 100 and 50 mg [facility]  Dose confirmed with pt.

## 2019-08-28 ENCOUNTER — Other Ambulatory Visit: Payer: Self-pay

## 2019-08-28 ENCOUNTER — Ambulatory Visit (HOSPITAL_COMMUNITY)
Admission: RE | Admit: 2019-08-28 | Discharge: 2019-08-28 | Disposition: A | Discharge status: Home / Self Care | Payer: BC Managed Care – PPO | Source: Ambulatory Visit | Attending: Advanced Practice Midwife | Admitting: Advanced Practice Midwife

## 2019-08-28 DIAGNOSIS — Z3480 Encounter for supervision of other normal pregnancy, unspecified trimester: Secondary | ICD-10-CM | POA: Diagnosis not present

## 2019-08-28 DIAGNOSIS — O09292 Supervision of pregnancy with other poor reproductive or obstetric history, second trimester: Secondary | ICD-10-CM

## 2019-08-28 DIAGNOSIS — Z363 Encounter for antenatal screening for malformations: Secondary | ICD-10-CM

## 2019-08-28 DIAGNOSIS — O34219 Maternal care for unspecified type scar from previous cesarean delivery: Secondary | ICD-10-CM

## 2019-08-28 DIAGNOSIS — Z3A19 19 weeks gestation of pregnancy: Secondary | ICD-10-CM

## 2019-08-29 ENCOUNTER — Encounter: Payer: Self-pay | Admitting: Physician Assistant

## 2019-08-30 ENCOUNTER — Ambulatory Visit (INDEPENDENT_AMBULATORY_CARE_PROVIDER_SITE_OTHER): Payer: BC Managed Care – PPO | Admitting: Physician Assistant

## 2019-08-30 ENCOUNTER — Encounter: Payer: Self-pay | Admitting: Physician Assistant

## 2019-08-30 VITALS — BP 132/76 | Temp 98.6°F | Ht 66.0 in | Wt 189.0 lb

## 2019-08-30 DIAGNOSIS — J0100 Acute maxillary sinusitis, unspecified: Secondary | ICD-10-CM | POA: Diagnosis not present

## 2019-08-30 MED ORDER — AZITHROMYCIN 250 MG PO TABS: ORAL_TABLET | ORAL | 0 refills | Status: DC

## 2019-08-30 NOTE — Progress Notes (Deleted)
4 week history:  Runny nose Cough  Congestion  At first started in chest, now seems in head She had clear nasal drainage, but turned yellow, now clear again.

## 2019-08-30 NOTE — Progress Notes (Signed)
Patient ID: Zoe Wiley, female   DOB: 1986-08-28, 33 y.o.   MRN: [id number] .Marland KitchenVirtual Visit via Video Note  I connected with Zoe Wiley on 08/30/19 at 10:50 AM EST by a video enabled telemedicine application and verified that I am speaking with the correct person using two identifiers.  Location: Patient: car Provider: clinic   I discussed the limitations of evaluation and management by telemedicine and the availability of in person appointments. The patient expressed understanding and agreed to proceed.  History of Present Illness: Patient is a 33 year old female who is [redacted] weeks pregnant who calls in to the clinic to discuss 4 weeks of cough, sinus pressure, headache, nasal congestion and runny nose.  She was originally tested for Covid which was negative.  Her daughter was originally sick and she believes she gave her virus to the entire family.  She was also Covid negative.  She has been using over-the-counter Tylenol Cold sinus for severe and has helped some.  Her symptoms seem to be getting a little better and then they got worse.  She denies any fever, chills, body aches, loss of smell or taste, GI symptoms.   .. Active Ambulatory Problems    Diagnosis Date Noted  . Generalized anxiety disorder 06/25/2012  . Syncope 12/21/2014  . RBBB 12/21/2014  . 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 Attention deficit disorder (ADD) in adult 01/29/2015  . Former cigarette smoker 12/02/2017  . History of one miscarriage 12/02/2017  . History of gestational diabetes 12/02/2017  . Depression with anxiety 12/02/2017  . Vaginal odor 12/31/2017  . Cervical high risk human papillomavirus (HPV) DNA test positive 01/05/2018  . 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 Elevated blood pressure reading 09/12/2018  . Hypertensive response to exercise 09/14/2018  . Hypertension goal BP (blood pressure) < 130/80 04/17/2019  . Palpitations 04/17/2019  . Systolic ejection murmur Q000111Q  . History of  syncope 04/17/2019  . Marital stress 04/17/2019  . Exercise intolerance 04/17/2019  . Encounter for contraceptive management 04/17/2019  . Supervision of other normal pregnancy, antepartum 07/18/2019  . History of gestational hypertension 08/15/2019  . Flu-like symptoms 08/15/2019   Resolved Ambulatory Problems    Diagnosis Date Noted  . ADD (attention deficit disorder) 06/25/2012  . Gestational diabetes mellitus (GDM) controlled on oral hypoglycemic drug 11/08/2016  . Arrest of dilation, delivered, current hospitalization 11/10/2016  . LGSIL on Pap smear of cervix 12/02/2017   Past Medical History:  Diagnosis Date  . Anxiety   . Depression    Reviewed med, allergy, problem list.    Observations/Objective: No acute distress. Normal mood and appearance.  No labored breathing.  No cough on video.   .. Today's Vitals   08/30/19 1027  BP: 132/76  Temp: 98.6 F (37 C)  TempSrc: Oral  Weight: 189 lb (85.7 kg)  Height: 5\' 6"  (1.676 m)   Body mass index is 30.51 kg/m.                Assessment and Plan: Marland KitchenMarland KitchenZion was seen today for cough.  Diagnoses and all orders for this visit:  Acute non-recurrent maxillary sinusitis -     azithromycin (ZITHROMAX) 250 MG tablet; Take 2 tablets now and then one tablet for 4 days.   Patient symptoms sound like acute sinusitis likely secondary sickening from a virus.  She has been Covid tested and negative.  I did give her Z-Pak and advised her to try Flonase.  Drink plenty of fluids and  follow-up as needed and/or if symptoms worsen.  Both medications are safe for pregnancy.  Follow Up Instructions:    I discussed the assessment and treatment plan with the patient. The patient was provided an opportunity to ask questions and all were answered. The patient agreed with the plan and demonstrated an understanding of the instructions.   The patient was advised to call back or seek an in-person evaluation if the symptoms worsen or if the  condition fails to improve as anticipated.    Iran Planas, PA-C

## 2019-09-12 ENCOUNTER — Ambulatory Visit (INDEPENDENT_AMBULATORY_CARE_PROVIDER_SITE_OTHER): Payer: BC Managed Care – PPO | Admitting: Advanced Practice Midwife

## 2019-09-12 ENCOUNTER — Other Ambulatory Visit: Payer: Self-pay

## 2019-09-12 VITALS — BP 129/79 | HR 74 | Temp 98.3°F | Wt 191.0 lb

## 2019-09-12 DIAGNOSIS — Z3480 Encounter for supervision of other normal pregnancy, unspecified trimester: Secondary | ICD-10-CM | POA: Diagnosis not present

## 2019-09-12 DIAGNOSIS — Z3A21 21 weeks gestation of pregnancy: Secondary | ICD-10-CM

## 2019-09-12 DIAGNOSIS — Z3482 Encounter for supervision of other normal pregnancy, second trimester: Secondary | ICD-10-CM

## 2019-09-12 NOTE — Patient Instructions (Signed)

## 2019-09-12 NOTE — Progress Notes (Signed)
   PRENATAL VISIT NOTE  Subjective:  Zoe Wiley is a 33 y.o. G3P1011 at [redacted]w[redacted]d being seen today for ongoing prenatal care.  She is currently monitored for the following issues for this low-risk pregnancy and has Generalized anxiety disorder; Syncope; RBBB; Ectopic atrial rhythm; Attention deficit disorder (ADD) in adult; Former cigarette smoker; History of one miscarriage; History of gestational diabetes; Depression with anxiety; Vaginal odor; Cervical high risk human papillomavirus (HPV) DNA test positive; CIN III with severe dysplasia; Elevated blood pressure reading; Hypertensive response to exercise; Hypertension goal BP (blood pressure) < 130/80; Palpitations; Systolic ejection murmur; History of syncope; Marital stress; Exercise intolerance; Encounter for contraceptive management; Supervision of other normal pregnancy, antepartum; History of gestational hypertension; and Flu-like symptoms on their problem list.  Patient reports no complaints. She would like to discuss TOLAC vs repeat c/s. Is leaning towards TOLAC.  Contractions: Not present. Vag. Bleeding: None.  Movement: Present. Denies leaking of fluid.   The following portions of the patient's history were reviewed and updated as appropriate: allergies, current medications, past family history, past medical history, past social history, past surgical history and problem list.   Objective:   Vitals:   09/12/19 1349  BP: 129/79  Pulse: 74  Temp: 98.3 F (36.8 C)  Weight: 86.6 kg    Fetal Status: Fetal Heart Rate (bpm): 137   Movement: Present     General:  Alert, oriented and cooperative. Patient is in no acute distress.  Skin: Skin is warm and dry. No rash noted.   Cardiovascular: Normal heart rate noted  Respiratory: Normal respiratory effort, no problems with respiration noted  Abdomen: Soft, gravid, appropriate for gestational age.  Pain/Pressure: Absent     Pelvic: Cervical exam deferred        Extremities: Normal range of  motion.  Edema: None  Mental Status: Normal mood and affect. Normal behavior. Normal judgment and thought content.   Assessment and Plan:  Pregnancy: G3P1011 at [redacted]w[redacted]d 1. Supervision of other normal pregnancy, antepartum - Pt here for routine prenatal care and AFP - No complaints today - Would like to discuss TOLAC vs repeat c/s. Pt to meet with MD to discuss further  - AFP only  Preterm labor symptoms and general obstetric precautions including but not limited to vaginal bleeding, contractions, leaking of fluid and fetal movement were reviewed in detail with the patient. Please refer to After Visit Summary for other counseling recommendations.   Return in about 5 weeks (around 10/17/2019).   Maryagnes Amos, SNM 2:14 PM

## 2019-09-13 LAB — ALPHA FETOPROTEIN, MATERNAL
AFP MoM: 0.5
AFP, Serum: 30.1 ng/mL
Calc'd Gestational Age: 21.4 weeks
Maternal Wt: 191 [lb_av]
Risk for ONTD: <1
Twins-AFP: 1

## 2019-09-15 ENCOUNTER — Other Ambulatory Visit: Payer: Self-pay | Admitting: Physician Assistant

## 2019-10-13 HISTORY — DX: Maternal care for unspecified type scar from previous cesarean delivery: O34.219

## 2019-10-17 ENCOUNTER — Other Ambulatory Visit: Payer: Self-pay

## 2019-10-17 ENCOUNTER — Ambulatory Visit (INDEPENDENT_AMBULATORY_CARE_PROVIDER_SITE_OTHER): Payer: BC Managed Care – PPO | Admitting: Certified Nurse Midwife

## 2019-10-17 ENCOUNTER — Encounter: Payer: Self-pay | Admitting: Certified Nurse Midwife

## 2019-10-17 VITALS — BP 120/69 | HR 81 | Temp 98.4°F | Wt 195.0 lb

## 2019-10-17 DIAGNOSIS — Z23 Encounter for immunization: Secondary | ICD-10-CM

## 2019-10-17 DIAGNOSIS — Z3480 Encounter for supervision of other normal pregnancy, unspecified trimester: Secondary | ICD-10-CM

## 2019-10-17 DIAGNOSIS — Z3A26 26 weeks gestation of pregnancy: Secondary | ICD-10-CM

## 2019-10-17 DIAGNOSIS — Z98891 History of uterine scar from previous surgery: Secondary | ICD-10-CM

## 2019-10-17 DIAGNOSIS — Z3482 Encounter for supervision of other normal pregnancy, second trimester: Secondary | ICD-10-CM

## 2019-10-17 NOTE — Progress Notes (Addendum)
   PRENATAL VISIT NOTE  Subjective:  Zoe Wiley is a 34 y.o. G3P1011 at [redacted]w[redacted]d being seen today for ongoing prenatal care.  She is currently monitored for the following issues for this low-risk pregnancy and has Generalized anxiety disorder; Syncope; RBBB; Ectopic atrial rhythm; Attention deficit disorder (ADD) in adult; Former cigarette smoker; History of one miscarriage; History of gestational diabetes; Depression with anxiety; Vaginal odor; Cervical high risk human papillomavirus (HPV) DNA test positive; CIN III with severe dysplasia; Elevated blood pressure reading; Hypertensive response to exercise; Hypertension goal BP (blood pressure) < 130/80; Palpitations; Systolic ejection murmur; History of syncope; Marital stress; Exercise intolerance; Supervision of other normal pregnancy, antepartum; and History of gestational hypertension on their problem list.  Patient reports no complaints.  Contractions: Not present. Vag. Bleeding: None.  Movement: Present. Denies leaking of fluid.   The following portions of the patient's history were reviewed and updated as appropriate: allergies, current medications, past family history, past medical history, past social history, past surgical history and problem list.   Objective:   Vitals:   10/17/19 0824  BP: 120/69  Pulse: 81  Temp: 98.4 F (36.9 C)  Weight: 88.5 kg    Fetal Status: Fetal Heart Rate (bpm): 140 Fundal Height: 27 cm Movement: Present     General:  Alert, oriented and cooperative. Patient is in no acute distress.  Skin: Skin is warm and dry. No rash noted.   Cardiovascular: Normal heart rate noted  Respiratory: Normal respiratory effort, no problems with respiration noted  Abdomen: Soft, gravid, appropriate for gestational age.  Pain/Pressure: Absent     Pelvic: Cervical exam deferred        Extremities: Normal range of motion.  Edema: None  Mental Status: Normal mood and affect. Normal behavior. Normal judgment and thought  content.   Assessment and Plan:  Pregnancy: G3P1011 at [redacted]w[redacted]d 1. Supervision of other normal pregnancy, antepartum - Routine prenatal care with GTT and labs. Tdap today.  - Patient doing well today. No complaints. - Anticipatory guidance for upcoming appointments.   - 2 Hour GTT - HIV antibody (with reflex) - CBC - RPR - Tdap vaccine greater than or equal to 7yo IM  2. Needs flu shot - Flu shot today  - Flu Vaccine QUAD 36+ mos IM  3. Previous cesarean section - Still undecided about TOLAC vs repeat. - Needs appointment with MD.  Preterm labor symptoms and general obstetric precautions including but not limited to vaginal bleeding, contractions, leaking of fluid and fetal movement were reviewed in detail with the patient. Please refer to After Visit Summary for other counseling recommendations.   Return in about 4 weeks (around 11/14/2019) for ROB-mychart with MD.   Maryagnes Amos, SNM

## 2019-10-17 NOTE — Patient Instructions (Signed)
Glucose Tolerance Test During Pregnancy Why am I having this test? The glucose tolerance test (GTT) is done to check how your body processes sugar (glucose). This is one of several tests used to diagnose diabetes that develops during pregnancy (gestational diabetes mellitus). Gestational diabetes is a temporary form of diabetes that some women develop during pregnancy. It usually occurs during the second trimester of pregnancy and goes away after delivery. Testing (screening) for gestational diabetes usually occurs between 24 and 28 weeks of pregnancy. You may have the GTT test after having a 1-hour glucose screening test if the results from that test indicate that you may have gestational diabetes. You may also have this test if:  You have a history of gestational diabetes.  You have a history of giving birth to very large babies or have experienced repeated fetal loss (stillbirth).  You have signs and symptoms of diabetes, such as: ? Changes in your vision. ? Tingling or numbness in your hands or feet. ? Changes in hunger, thirst, and urination that are not otherwise explained by your pregnancy. What is being tested? This test measures the amount of glucose in your blood at different times during a period of 3 hours. This indicates how well your body is able to process glucose. What kind of sample is taken?  Blood samples are required for this test. They are usually collected by inserting a needle into a blood vessel. How do I prepare for this test?  For 3 days before your test, eat normally. Have plenty of carbohydrate-rich foods.  Follow instructions from your health care provider about: ? Eating or drinking restrictions on the day of the test. You may be asked to not eat or drink anything other than water (fast) starting 8-10 hours before the test. ? Changing or stopping your regular medicines. Some medicines may interfere with this test. Tell a health care provider about:  All  medicines you are taking, including vitamins, herbs, eye drops, creams, and over-the-counter medicines.  Any blood disorders you have.  Any surgeries you have had.  Any medical conditions you have. What happens during the test? First, your blood glucose will be measured. This is referred to as your fasting blood glucose, since you fasted before the test. Then, you will drink a glucose solution that contains a certain amount of glucose. Your blood glucose will be measured again 1, 2, and 3 hours after drinking the solution. This test takes about 3 hours to complete. You will need to stay at the testing location during this time. During the testing period:  Do not eat or drink anything other than the glucose solution.  Do not exercise.  Do not use any products that contain nicotine or tobacco, such as cigarettes and e-cigarettes. If you need help stopping, ask your health care provider. The testing procedure may vary among health care providers and hospitals. How are the results reported? Your results will be reported as milligrams of glucose per deciliter of blood (mg/dL) or millimoles per liter (mmol/L). Your health care provider will compare your results to normal ranges that were established after testing a large group of people (reference ranges). Reference ranges may vary among labs and hospitals. For this test, common reference ranges are:  Fasting: less than 95-105 mg/dL (5.3-5.8 mmol/L).  1 hour after drinking glucose: less than 180-190 mg/dL (10.0-10.5 mmol/L).  2 hours after drinking glucose: less than 155-165 mg/dL (8.6-9.2 mmol/L).  3 hours after drinking glucose: 140-145 mg/dL (7.8-8.1 mmol/L). What do the   results mean? Results within reference ranges are considered normal, meaning that your glucose levels are well-controlled. If two or more of your blood glucose levels are high, you may be diagnosed with gestational diabetes. If only one level is high, your health care  provider may suggest repeat testing or other tests to confirm a diagnosis. Talk with your health care provider about what your results mean. Questions to ask your health care provider Ask your health care provider, or the department that is doing the test:  When will my results be ready?  How will I get my results?  What are my treatment options?  What other tests do I need?  What are my next steps? Summary  The glucose tolerance test (GTT) is one of several tests used to diagnose diabetes that develops during pregnancy (gestational diabetes mellitus). Gestational diabetes is a temporary form of diabetes that some women develop during pregnancy.  You may have the GTT test after having a 1-hour glucose screening test if the results from that test indicate that you may have gestational diabetes. You may also have this test if you have any symptoms or risk factors for gestational diabetes.  Talk with your health care provider about what your results mean. This information is not intended to replace advice given to you by your health care provider. Make sure you discuss any questions you have with your health care provider. Document Revised: 01/19/2019 Document Reviewed: 05/10/2017 Elsevier Patient Education  2020 Elsevier Inc.  

## 2019-10-18 ENCOUNTER — Other Ambulatory Visit: Payer: Self-pay | Admitting: *Deleted

## 2019-10-18 ENCOUNTER — Encounter: Payer: Self-pay | Admitting: Certified Nurse Midwife

## 2019-10-18 DIAGNOSIS — O24419 Gestational diabetes mellitus in pregnancy, unspecified control: Secondary | ICD-10-CM

## 2019-10-18 HISTORY — DX: Gestational diabetes mellitus in pregnancy, unspecified control: O24.419

## 2019-10-18 LAB — 2HR GTT W 1 HR, CARPENTER, 75 G
Glucose, 1 Hr, Gest: 187 mg/dL — ABNORMAL HIGH (ref 65–179)
Glucose, 2 Hr, Gest: 151 mg/dL (ref 65–152)
Glucose, Fasting, Gest: 76 mg/dL (ref 65–91)

## 2019-10-18 LAB — CBC
HCT: 32.3 % — ABNORMAL LOW (ref 35.0–45.0)
Hemoglobin: 10.9 g/dL — ABNORMAL LOW (ref 11.7–15.5)
MCH: 30 pg (ref 27.0–33.0)
MCHC: 33.7 g/dL (ref 32.0–36.0)
MCV: 89 fL (ref 80.0–100.0)
MPV: 11.5 fL (ref 7.5–12.5)
Platelets: 198 10*3/uL (ref 140–400)
RBC: 3.63 10*6/uL — ABNORMAL LOW (ref 3.80–5.10)
RDW: 12.3 % (ref 11.0–15.0)
WBC: 6.9 10*3/uL (ref 3.8–10.8)

## 2019-10-18 LAB — HIV ANTIBODY (ROUTINE TESTING W REFLEX): HIV 1&2 Ab, 4th Generation: NONREACTIVE

## 2019-10-18 LAB — RPR: RPR Ser Ql: NONREACTIVE

## 2019-10-18 MED ORDER — BLOOD GLUCOSE MONITOR KIT: PACK | 0 refills | Status: DC

## 2019-10-18 NOTE — Progress Notes (Signed)
Pt was notified via my chart by Wende Bushy CNM that she failed her 2 hr GTT.  Per Wende Bushy, CNM Glucometer, lancets and strips were sent to her pharmacy.  Pt had GDM in her last pregnacy

## 2019-10-23 ENCOUNTER — Other Ambulatory Visit: Payer: Self-pay

## 2019-10-23 DIAGNOSIS — O24419 Gestational diabetes mellitus in pregnancy, unspecified control: Secondary | ICD-10-CM

## 2019-10-23 MED ORDER — BLOOD GLUCOSE MONITOR KIT: PACK | 0 refills | Status: DC

## 2019-10-23 NOTE — Progress Notes (Signed)
Rx for glucometer and supplies sent to pharmacy.

## 2019-11-13 ENCOUNTER — Telehealth (INDEPENDENT_AMBULATORY_CARE_PROVIDER_SITE_OTHER): Payer: BC Managed Care – PPO | Admitting: Obstetrics & Gynecology

## 2019-11-13 ENCOUNTER — Encounter: Payer: Self-pay | Admitting: Obstetrics & Gynecology

## 2019-11-13 DIAGNOSIS — Z3480 Encounter for supervision of other normal pregnancy, unspecified trimester: Secondary | ICD-10-CM

## 2019-11-13 DIAGNOSIS — O24419 Gestational diabetes mellitus in pregnancy, unspecified control: Secondary | ICD-10-CM

## 2019-11-13 DIAGNOSIS — Z3A30 30 weeks gestation of pregnancy: Secondary | ICD-10-CM

## 2019-11-13 NOTE — Progress Notes (Signed)
I connected with@ on 11/13/19 at  1:35 PM EST by: mychart and verified that I am speaking with the correct person using two identifiers.  Patient is located at home and provider is located at [place].     The purpose of this virtual visit is to provide medical care while limiting exposure to the novel coronavirus. I discussed the limitations, risks, security and privacy concerns of performing an evaluation and management service by mychart and the availability of in person appointments. I also discussed with the patient that there may be a patient responsible charge related to this service. By engaging in this virtual visit, you consent to the provision of healthcare.  Additionally, you authorize for your insurance to be billed for the services provided during this visit.  The patient expressed understanding and agreed to proceed.  The following staff members participated in the virtual visit:  Renaldo Harrison , RN    PRENATAL VISIT NOTE  Subjective:  Zoe Wiley is a 34 y.o. G3P1011 at [redacted]w[redacted]d  for phone visit for ongoing prenatal care.  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  Patient reports no complaints.  Contractions: Not present. Vag. Bleeding: None.  Movement:  Present. Denies leaking of fluid.   The following portions of the patient's history were reviewed and updated as appropriate: allergies, current medications, past family history, past medical history, past social history, past surgical history and problem list.   Objective:  There were no vitals filed for this visit. Self-Obtained  Fetal Status:     Movement: Present     Assessment and Plan:  Pregnancy: G3P1011 at [redacted]w[redacted]d 1. Gestational diabetes mellitus (GDM), antepartum, gestational diabetes method of control unspecified One abnormal value  Fastings:  High 80s to low 93 ppBreakfast 108-142, most below 120 PpLunch:  110s-150 Ppdinner:  121,103,102, 128, 119, 92, 127,  Sign up for baby Rx to enter values.  Pt also agrees to start taking BP with Baby Rx.   [place] for growth at 32 weeks   2. Supervision of other normal pregnancy, antepartum Wants BTL and Rpt c/s.    Preterm labor symptoms and general obstetric precautions including but not limited to vaginal bleeding, contractions, leaking of fluid and fetal movement were reviewed in detail with the patient.  RTC 2 weeks  Time spent on virtual visit: 20 minutes  Silas Sacramento, MD

## 2019-11-16 ENCOUNTER — Other Ambulatory Visit: Payer: Self-pay | Admitting: Physician Assistant

## 2019-11-16 DIAGNOSIS — F411 Generalized anxiety disorder: Secondary | ICD-10-CM

## 2019-11-24 ENCOUNTER — Other Ambulatory Visit: Payer: Self-pay | Admitting: Certified Nurse Midwife

## 2019-11-28 ENCOUNTER — Other Ambulatory Visit: Payer: Self-pay

## 2019-11-28 ENCOUNTER — Ambulatory Visit (INDEPENDENT_AMBULATORY_CARE_PROVIDER_SITE_OTHER): Payer: BC Managed Care – PPO | Admitting: Obstetrics and Gynecology

## 2019-11-28 VITALS — BP 126/66 | HR 83 | Temp 98.3°F | Wt 202.0 lb

## 2019-11-28 DIAGNOSIS — O24419 Gestational diabetes mellitus in pregnancy, unspecified control: Secondary | ICD-10-CM

## 2019-11-28 DIAGNOSIS — Z3A32 32 weeks gestation of pregnancy: Secondary | ICD-10-CM

## 2019-11-28 MED ORDER — ACCU-CHEK SOFTCLIX LANCETS MISC: 12 refills | Status: DC

## 2019-11-28 NOTE — Progress Notes (Signed)
   PRENATAL VISIT NOTE  Subjective:  Zoe Wiley is a 34 y.o. G3P1011 at [redacted]w[redacted]d being seen today for ongoing prenatal care.  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  Patient reports no complaints.  Contractions: Irritability. Vag. Bleeding: None.  Movement: Present. Denies leaking of fluid.   The following portions of the patient's history were reviewed and updated as appropriate: allergies, current medications, past family history, past medical history, past social history, past surgical history and problem list.   Objective:   Vitals:   11/28/19 1433  BP: 126/66  Pulse: 83  Temp: 98.3 F (36.8 C)  Weight: 202 lb (91.6 kg)    Fetal Status: Fetal Heart Rate (bpm): 134 Fundal Height: 35 cm Movement: Present     General:  Alert, oriented and cooperative. Patient is in no acute distress.  Skin: Skin is warm and dry. No rash noted.   Cardiovascular: Normal heart rate noted  Respiratory: Normal respiratory effort, no problems with respiration noted  Abdomen: Soft, gravid, appropriate for gestational age.  Pain/Pressure: Absent     Pelvic: Cervical exam deferred        Extremities: Normal range of motion.  Edema: None  Mental Status: Normal mood and affect.  Normal behavior. Normal judgment and thought content.   Assessment and Plan:  Pregnancy: G3P1011 at [redacted]w[redacted]d   1. Gestational diabetes mellitus (GDM), antepartum, gestational diabetes method of control unspecified  Not taking ASA- did not understand she was supposed to take it daily.  - Accu-Chek Softclix Lancets lancets; Use as instructed  Dispense: 100 each; Refill: 12  One abnormal value  Fastings:  High 80s to low 93, one reading 97 ppBreakfast most below 120 PpLunch:  110s-150, 3 readings > 120 our of 13 readings.  Ppdinner:  majority less than 120's [place] for growth at 32 weeks scheduled for tomorrow.  Prefers in person visits.   There are no diagnoses linked to this encounter. Preterm labor symptoms and general obstetric precautions including but not limited to vaginal bleeding, contractions, leaking of fluid and fetal movement were reviewed in detail with the patient. Please refer to After Visit Summary for other counseling recommendations.   No follow-ups on file.  Future Appointments  Date Time Provider [place]  11/29/2019  9:00 AM [place] [facility]  11/29/2019  9:00 AM [place] 3 WH-MFCUS [facility]-US  12/12/2019  3:40 PM Lajean Manes, CNM [facility]    Noni Saupe, NP

## 2019-11-29 ENCOUNTER — Other Ambulatory Visit (HOSPITAL_COMMUNITY): Payer: Self-pay | Admitting: *Deleted

## 2019-11-29 ENCOUNTER — Ambulatory Visit (HOSPITAL_COMMUNITY)
Admission: RE | Admit: 2019-11-29 | Discharge: 2019-11-29 | Disposition: A | Discharge status: Home / Self Care | Payer: BC Managed Care – PPO | Source: Ambulatory Visit | Attending: Obstetrics and Gynecology | Admitting: Obstetrics and Gynecology

## 2019-11-29 ENCOUNTER — Encounter (HOSPITAL_COMMUNITY): Payer: Self-pay

## 2019-11-29 ENCOUNTER — Ambulatory Visit (HOSPITAL_COMMUNITY): Payer: BC Managed Care – PPO | Admitting: *Deleted

## 2019-11-29 DIAGNOSIS — Z3A32 32 weeks gestation of pregnancy: Secondary | ICD-10-CM

## 2019-11-29 DIAGNOSIS — Z3480 Encounter for supervision of other normal pregnancy, unspecified trimester: Secondary | ICD-10-CM

## 2019-11-29 DIAGNOSIS — Z362 Encounter for other antenatal screening follow-up: Secondary | ICD-10-CM | POA: Diagnosis not present

## 2019-11-29 DIAGNOSIS — O24410 Gestational diabetes mellitus in pregnancy, diet controlled: Secondary | ICD-10-CM

## 2019-11-29 DIAGNOSIS — O24419 Gestational diabetes mellitus in pregnancy, unspecified control: Secondary | ICD-10-CM | POA: Diagnosis not present

## 2019-12-08 ENCOUNTER — Encounter: Payer: Self-pay | Admitting: Family Medicine

## 2019-12-12 ENCOUNTER — Encounter: Payer: Self-pay | Admitting: Certified Nurse Midwife

## 2019-12-12 ENCOUNTER — Ambulatory Visit (INDEPENDENT_AMBULATORY_CARE_PROVIDER_SITE_OTHER): Payer: BC Managed Care – PPO | Admitting: Certified Nurse Midwife

## 2019-12-12 ENCOUNTER — Other Ambulatory Visit: Payer: Self-pay

## 2019-12-12 VITALS — BP 127/70 | HR 78 | Temp 98.3°F | Wt 208.0 lb

## 2019-12-12 DIAGNOSIS — Z3A34 34 weeks gestation of pregnancy: Secondary | ICD-10-CM

## 2019-12-12 DIAGNOSIS — O24410 Gestational diabetes mellitus in pregnancy, diet controlled: Secondary | ICD-10-CM

## 2019-12-12 DIAGNOSIS — Z8759 Personal history of other complications of pregnancy, childbirth and the puerperium: Secondary | ICD-10-CM

## 2019-12-12 DIAGNOSIS — Z3480 Encounter for supervision of other normal pregnancy, unspecified trimester: Secondary | ICD-10-CM

## 2019-12-12 NOTE — Progress Notes (Signed)
   PRENATAL VISIT NOTE  Subjective:  Zoe Wiley is a 34 y.o. G3P1011 at [redacted]w[redacted]d being seen today for ongoing prenatal care.  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  Patient reports no complaints.  Contractions: Not present. Vag. Bleeding: None.  Movement: Present. Denies leaking of fluid.   The following portions of the patient's history were reviewed and updated as appropriate: allergies, current medications, past family history, past medical history, past social history, past surgical history and problem list.   Objective:   Vitals:   12/12/19 1536  BP: 127/70  Pulse: 78  Temp: 98.3 F (36.8 C)  Weight: 208 lb (94.3 kg)    Fetal Status: Fetal Heart Rate (bpm): 138 Fundal Height: 39 cm Movement: Present     General:  Alert, oriented and cooperative. Patient is in no acute distress.  Skin: Skin is warm and dry. No rash noted.   Cardiovascular: Normal heart rate noted  Respiratory: Normal respiratory effort, no problems with respiration noted  Abdomen: Soft, gravid, appropriate for gestational age.  Pain/Pressure: Absent     Pelvic: Cervical exam deferred        Extremities: Normal range of motion.  Edema: None  Mental Status: Normal mood and affect.  Normal behavior. Normal judgment and thought content.   Assessment and Plan:  Pregnancy: G3P1011 at [redacted]w[redacted]d 1. Supervision of other normal pregnancy, antepartum - patient doing well no complaints - routine prenatal care  - anticipatory guidance on upcoming appointments with next in person for GBS, GC/C swabs   2. Diet controlled gestational diabetes mellitus (GDM) in third trimester - reviewed CBGs over the past 2 weeks - one abnormal value today after lunch which was 133  - Fasting under 92 and PP under 120  3. History of gestational hypertension - BP stable today  - continue bASA  Preterm labor symptoms and general obstetric precautions including but not limited to vaginal bleeding, contractions, leaking of fluid and fetal movement were reviewed in detail with the patient. Please refer to After Visit Summary for other counseling recommendations.   Return in about 2 weeks (around 12/26/2019) for ROB/GBS.  Future Appointments  Date Time Provider [place]  12/26/2019  4:00 PM [name], MD [facility]  12/28/2019  9:00 AM [place] NURSE [place] [facility]-US  12/28/2019  9:00 AM [facility]-MFC [place] 3 WH-MFCUS MFC-US    Gracelin Weisberg C Tamula Morrical, CNM

## 2019-12-26 ENCOUNTER — Other Ambulatory Visit: Payer: Self-pay

## 2019-12-26 ENCOUNTER — Ambulatory Visit (INDEPENDENT_AMBULATORY_CARE_PROVIDER_SITE_OTHER): Payer: BC Managed Care – PPO | Admitting: Family Medicine

## 2019-12-26 VITALS — BP 127/77 | HR 73 | Temp 98.4°F | Wt 209.0 lb

## 2019-12-26 DIAGNOSIS — Z113 Encounter for screening for infections with a predominantly sexual mode of transmission: Secondary | ICD-10-CM

## 2019-12-26 DIAGNOSIS — O24410 Gestational diabetes mellitus in pregnancy, diet controlled: Secondary | ICD-10-CM

## 2019-12-26 DIAGNOSIS — Z3403 Encounter for supervision of normal first pregnancy, third trimester: Secondary | ICD-10-CM | POA: Diagnosis not present

## 2019-12-26 DIAGNOSIS — Z3480 Encounter for supervision of other normal pregnancy, unspecified trimester: Secondary | ICD-10-CM

## 2019-12-26 DIAGNOSIS — Z3A36 36 weeks gestation of pregnancy: Secondary | ICD-10-CM

## 2019-12-26 NOTE — Progress Notes (Signed)
   PRENATAL VISIT NOTE  Subjective:  Zoe Wiley is a 34 y.o. G3P1011 at [redacted]w[redacted]d being seen today for ongoing prenatal care.  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  Patient reports irregular contractions..  Contractions: Irritability. Vag. Bleeding: None.  Movement: Present. Denies leaking of fluid.   The following portions of the patient's history were reviewed and updated as appropriate: allergies, current medications, past family history, past medical history, past social history, past surgical history and problem list.   Objective:   Vitals:   12/26/19 1559  BP: 127/77  Pulse: 73  Temp: 98.4 F (36.9 C)  Weight: 209 lb (94.8 kg)    Fetal Status: Fetal Heart Rate (bpm): 141 Fundal Height: 39 cm Movement: Present  Presentation: Vertex  General:  Alert, oriented and cooperative. Patient is in no acute distress.  Skin: Skin is warm and dry. No rash noted.   Cardiovascular: Normal heart rate noted  Respiratory: Normal respiratory effort, no problems with respiration noted  Abdomen: Soft, gravid, appropriate for gestational age.  Pain/Pressure: Present     Pelvic: Cervical exam deferred        Extremities: Normal range of motion.  Edema: None  Mental  Status: Normal mood and affect. Normal behavior. Normal judgment and thought content.   Assessment and Plan:  Pregnancy: G3P1011 at [redacted]w[redacted]d 1. Encounter for supervision of normal first pregnancy in third trimester Cultures today Could not reach cervix For RCS - Culture, beta strep (group b only) - Cervicovaginal ancillary only( [place])  2. Supervision of other high risk pregnancy, antepartum Continue prenatal care.   3. Diet controlled gestational diabetes mellitus (GDM) in third trimester Continue diet See BabyScripts data, per her report, they are good. Has u/s for growth 3/18.  Preterm labor symptoms and general obstetric precautions including but not limited to vaginal bleeding, contractions, leaking of fluid and fetal movement were reviewed in detail with the patient. Please refer to After Visit Summary for other counseling recommendations.   Return in 1 week (on 01/02/2020) for virtual.  Future Appointments  Date Time Provider [place]  12/28/2019  9:00 AM [place] [facility]  12/28/2019  9:00 AM [facility]-MFC [place] 3 WH-MFCUS MFC-US  01/02/2020 11:15 AM [place], Kathie Dike, CNM [facility]    [name], MD

## 2019-12-26 NOTE — Progress Notes (Signed)
Dr Kennon Rounds doing her c-section

## 2019-12-26 NOTE — Patient Instructions (Signed)

## 2019-12-27 ENCOUNTER — Encounter: Payer: Self-pay | Admitting: *Deleted

## 2019-12-27 LAB — CERVICOVAGINAL ANCILLARY ONLY
Chlamydia: NEGATIVE
Comment: NEGATIVE
Comment: NORMAL
Neisseria Gonorrhea: NEGATIVE

## 2019-12-28 ENCOUNTER — Telehealth: Payer: Self-pay | Admitting: *Deleted

## 2019-12-28 ENCOUNTER — Other Ambulatory Visit: Payer: Self-pay

## 2019-12-28 ENCOUNTER — Ambulatory Visit (HOSPITAL_COMMUNITY): Payer: BC Managed Care – PPO | Admitting: *Deleted

## 2019-12-28 ENCOUNTER — Encounter (HOSPITAL_COMMUNITY): Payer: Self-pay

## 2019-12-28 ENCOUNTER — Ambulatory Visit (HOSPITAL_COMMUNITY)
Admission: RE | Admit: 2019-12-28 | Discharge: 2019-12-28 | Disposition: A | Discharge status: Home / Self Care | Payer: BC Managed Care – PPO | Source: Ambulatory Visit | Attending: Obstetrics and Gynecology | Admitting: Obstetrics and Gynecology

## 2019-12-28 DIAGNOSIS — Z362 Encounter for other antenatal screening follow-up: Secondary | ICD-10-CM

## 2019-12-28 DIAGNOSIS — Z3480 Encounter for supervision of other normal pregnancy, unspecified trimester: Secondary | ICD-10-CM | POA: Diagnosis not present

## 2019-12-28 DIAGNOSIS — O34219 Maternal care for unspecified type scar from previous cesarean delivery: Secondary | ICD-10-CM

## 2019-12-28 DIAGNOSIS — O24410 Gestational diabetes mellitus in pregnancy, diet controlled: Secondary | ICD-10-CM

## 2019-12-28 DIAGNOSIS — O09299 Supervision of pregnancy with other poor reproductive or obstetric history, unspecified trimester: Secondary | ICD-10-CM | POA: Diagnosis not present

## 2019-12-28 DIAGNOSIS — Z3A36 36 weeks gestation of pregnancy: Secondary | ICD-10-CM

## 2019-12-28 NOTE — Telephone Encounter (Signed)
-----   Message from Lezlie Lye, NP sent at 12/28/2019 10:23 AM EDT ----- HI!  Per MFM this patient should initiate weekly NST's in the office starting Friday or Monday d/t polyhydramnios'.   Thank you,  Anderson Malta

## 2019-12-28 NOTE — Telephone Encounter (Signed)
Pt notified of need for Wkly NST's and appt set for 01/01/20 @ 3:00

## 2019-12-29 LAB — CULTURE, BETA STREP (GROUP B ONLY)
MICRO NUMBER:: 10258021
SPECIMEN QUALITY:: ADEQUATE

## 2020-01-01 ENCOUNTER — Telehealth (HOSPITAL_COMMUNITY): Payer: Self-pay | Admitting: *Deleted

## 2020-01-01 ENCOUNTER — Other Ambulatory Visit: Payer: Self-pay

## 2020-01-01 ENCOUNTER — Ambulatory Visit (INDEPENDENT_AMBULATORY_CARE_PROVIDER_SITE_OTHER): Payer: BC Managed Care – PPO

## 2020-01-01 VITALS — BP 128/72 | HR 75

## 2020-01-01 DIAGNOSIS — Z3A37 37 weeks gestation of pregnancy: Secondary | ICD-10-CM | POA: Diagnosis not present

## 2020-01-01 DIAGNOSIS — O24410 Gestational diabetes mellitus in pregnancy, diet controlled: Secondary | ICD-10-CM | POA: Diagnosis not present

## 2020-01-01 NOTE — Progress Notes (Signed)
Pt here for NST. NST reviewed by Kathrene Alu, RN. Reactive NST. Pt will return on 3/25 for NST.

## 2020-01-01 NOTE — Telephone Encounter (Signed)
Preadmission screen  

## 2020-01-01 NOTE — Patient Instructions (Signed)
Zoe Wiley  01/01/2020   Your procedure is scheduled on:  01/13/2020  Arrive at [address] at [facility] on [address] at [address]  and [address]. You are invited to use the FREE valet parking or use the Visitor's parking deck.  Pick up the phone at the desk and dial [phone].  Call this number if you have problems the morning of surgery: [phone]  Remember:   Do not eat food:(After Midnight) Desps de medianoche.  Do not drink clear liquids: (After Midnight) Desps de medianoche.  Take these medicines the morning of surgery with A SIP OF WATER:  none   Do not wear jewelry, make-up or nail polish.  Do not wear lotions, powders, or perfumes. Do not wear deodorant.  Do not shave 48 hours prior to surgery.  Do not bring valuables to the hospital.  [facility] is not   responsible for any belongings or valuables brought to the hospital.  Contacts, dentures or bridgework may not be worn into surgery.  Leave suitcase in the car. After surgery it may be brought to your room.  For patients admitted to the hospital, checkout time is 11:00 AM the day of              discharge.      Please read over the following fact sheets that you were given:     Preparing for Surgery

## 2020-01-02 ENCOUNTER — Encounter (HOSPITAL_COMMUNITY): Payer: Self-pay

## 2020-01-02 ENCOUNTER — Telehealth (INDEPENDENT_AMBULATORY_CARE_PROVIDER_SITE_OTHER): Payer: BC Managed Care – PPO | Admitting: Advanced Practice Midwife

## 2020-01-02 DIAGNOSIS — O34219 Maternal care for unspecified type scar from previous cesarean delivery: Secondary | ICD-10-CM

## 2020-01-02 DIAGNOSIS — Z3480 Encounter for supervision of other normal pregnancy, unspecified trimester: Secondary | ICD-10-CM

## 2020-01-02 DIAGNOSIS — O24410 Gestational diabetes mellitus in pregnancy, diet controlled: Secondary | ICD-10-CM

## 2020-01-02 NOTE — Progress Notes (Signed)
   TELEHEALTH VIRTUAL OBSTETRICS VISIT ENCOUNTER NOTE  I connected with Zoe Wiley on 01/02/20 at 11:15 AM EDT by MyChart virtual visit at home and verified that I am speaking with the correct person using two identifiers.   I discussed the limitations, risks, security and privacy concerns of performing an evaluation and management service by telephone and the availability of in person appointments. I also discussed with the patient that there may be a patient responsible charge related to this service. The patient expressed understanding and agreed to proceed.  Subjective:  Zoe Wiley is a 34 y.o. G3P1011 at [redacted]w[redacted]d being followed for ongoing prenatal care.  She is currently monitored for the following issues for this low-risk pregnancy and has Generalized anxiety disorder; Syncope; RBBB; Ectopic atrial rhythm; Attention deficit disorder (ADD) in adult; Former cigarette smoker; History of one miscarriage; History of gestational diabetes; Depression with anxiety; Cervical high risk human papillomavirus (HPV) DNA test positive; CIN III with severe dysplasia; Elevated blood pressure reading; Hypertensive response to exercise; Hypertension goal BP (blood pressure) < 130/80; Palpitations; Systolic ejection murmur; History of syncope; Marital stress; Exercise intolerance; Supervision of other normal pregnancy, antepartum; History of gestational hypertension; and Gestational diabetes mellitus (GDM) on their problem list.  Patient reports occasional contractions. Reports fetal movement. Denies any contractions, bleeding or leaking of fluid.   The following portions of the patient's history were reviewed and updated as appropriate: allergies, current medications, past family history, past medical history, past social history, past surgical history and problem list.   Objective:   General:  Alert, oriented and cooperative.   Mental Status: Normal mood and affect perceived. Normal judgment and thought  content.  Rest of physical exam deferred due to type of encounter  Assessment and Plan:  Pregnancy: G3P1011 at [redacted]w[redacted]d 1. Diet controlled gestational diabetes mellitus (GDM) in third trimester --Reviewed glucose log, Fasting and PP all wnl  2. Supervision of other normal pregnancy, antepartum --Pt reports good fetal movement, denies cramping, LOF, or vaginal bleeding --BP today wnl, 127/70 --Anticipatory guidance about next visits/weeks of pregnancy given. --Next prenatal visit with NST next week, pt has scheduled repeat C/S at 39 weeks  Term labor symptoms and general obstetric precautions including but not limited to vaginal bleeding, contractions, leaking of fluid and fetal movement were reviewed in detail with the patient.  I discussed the assessment and treatment plan with the patient. The patient was provided an opportunity to ask questions and all were answered. The patient agreed with the plan and demonstrated an understanding of the instructions. The patient was advised to call back or seek an in-person office evaluation/go to [facility]U at [facility] for any urgent or concerning symptoms. Please refer to After Visit Summary for other counseling recommendations.   I provided 10 minutes of face-to-face time during this encounter.  No follow-ups on file.  Future Appointments  Date Time Provider [place]  01/04/2020  3:00 PM [facility] NURSE [facility]  01/11/2020  8:30 AM [facility] 1 [facility] None    Zoe Wiley, [place] for [place]

## 2020-01-04 ENCOUNTER — Ambulatory Visit (INDEPENDENT_AMBULATORY_CARE_PROVIDER_SITE_OTHER): Payer: BC Managed Care – PPO | Admitting: *Deleted

## 2020-01-04 ENCOUNTER — Other Ambulatory Visit: Payer: Self-pay

## 2020-01-04 VITALS — BP 128/74 | HR 78 | Wt 210.0 lb

## 2020-01-04 DIAGNOSIS — O24410 Gestational diabetes mellitus in pregnancy, diet controlled: Secondary | ICD-10-CM

## 2020-01-04 DIAGNOSIS — Z3A37 37 weeks gestation of pregnancy: Secondary | ICD-10-CM

## 2020-01-08 ENCOUNTER — Other Ambulatory Visit: Payer: Self-pay

## 2020-01-08 ENCOUNTER — Encounter (HOSPITAL_COMMUNITY): Payer: Self-pay | Admitting: Obstetrics & Gynecology

## 2020-01-08 ENCOUNTER — Inpatient Hospital Stay (HOSPITAL_COMMUNITY)
Admission: AD | Admit: 2020-01-08 | Discharge: 2020-01-08 | Disposition: A | Discharge status: Home / Self Care | Payer: BC Managed Care – PPO | Attending: Obstetrics & Gynecology | Admitting: Obstetrics & Gynecology

## 2020-01-08 ENCOUNTER — Inpatient Hospital Stay (HOSPITAL_BASED_OUTPATIENT_CLINIC_OR_DEPARTMENT_OTHER): Payer: BC Managed Care – PPO

## 2020-01-08 ENCOUNTER — Ambulatory Visit (INDEPENDENT_AMBULATORY_CARE_PROVIDER_SITE_OTHER): Payer: BC Managed Care – PPO | Admitting: *Deleted

## 2020-01-08 VITALS — BP 134/77 | HR 81 | Temp 98.2°F | Wt 214.0 lb

## 2020-01-08 DIAGNOSIS — Z3A38 38 weeks gestation of pregnancy: Secondary | ICD-10-CM

## 2020-01-08 DIAGNOSIS — O09293 Supervision of pregnancy with other poor reproductive or obstetric history, third trimester: Secondary | ICD-10-CM

## 2020-01-08 DIAGNOSIS — O34219 Maternal care for unspecified type scar from previous cesarean delivery: Secondary | ICD-10-CM

## 2020-01-08 DIAGNOSIS — O24410 Gestational diabetes mellitus in pregnancy, diet controlled: Secondary | ICD-10-CM

## 2020-01-08 DIAGNOSIS — Z87891 Personal history of nicotine dependence: Secondary | ICD-10-CM | POA: Diagnosis not present

## 2020-01-08 DIAGNOSIS — O403XX0 Polyhydramnios, third trimester, not applicable or unspecified: Secondary | ICD-10-CM

## 2020-01-08 DIAGNOSIS — O34211 Maternal care for low transverse scar from previous cesarean delivery: Secondary | ICD-10-CM | POA: Diagnosis not present

## 2020-01-08 DIAGNOSIS — O288 Other abnormal findings on antenatal screening of mother: Secondary | ICD-10-CM

## 2020-01-08 DIAGNOSIS — O133 Gestational [pregnancy-induced] hypertension without significant proteinuria, third trimester: Secondary | ICD-10-CM

## 2020-01-08 DIAGNOSIS — Z3689 Encounter for other specified antenatal screening: Secondary | ICD-10-CM | POA: Diagnosis not present

## 2020-01-08 NOTE — MAU Note (Signed)
Sent from Office for non-reassuring FHR. Lots of FM noted by pt and RN when applying EFM. Patient gestational DM diet controlled, denies VB, LOF.

## 2020-01-08 NOTE — MAU Provider Note (Signed)
History     CSN: [id number]  Arrival date and time: 01/08/20 1744   None     Chief Complaint  Patient presents with  . Pregnancy Ultrasound   HPI  34 yo G3P1011 at 38.[redacted] weeks EGA seen at the [facility] office today for NST due to her diet controlled GDM. Her NST was reassuring but not reactive. She was sent to the MAU for BPP. She reports good fm, denies bleeding or painful contractions.  OB History    Gravida  3   Para  1   Term  1   Preterm      AB  1   Living  1     SAB  1   TAB      Ectopic      Multiple  0   Live Births  1           Past Medical History:  Diagnosis Date  . ADD (attention deficit disorder) 01/29/2015  . Anxiety   . Cervical high risk human papillomavirus (HPV) DNA test positive 01/05/2018  . Depression   . 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 Former cigarette smoker 12/02/2017  . Generalized anxiety disorder 06/25/2012   Child of alcmot   . Gestational diabetes   . Gestational diabetes mellitus (GDM) controlled on oral hypoglycemic drug 11/08/2016  . History of gestational hypertension   . 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 RBBB 12/21/2014  . Syncope 12/21/2014    Past Surgical History:  Procedure Laterality Date  . CESAREAN SECTION N/A 11/09/2016   Procedure: CESAREAN SECTION;  Surgeon: Cheri Fowler, MD;  Location: [facility];  Service: Obstetrics;  Laterality: N/A;  . FRACTURE SURGERY     bilateral wrist    Family History  Problem Relation Age of Onset  . Arrhythmia Father        atrial fibrillation  . Hypertension Father   . Breast cancer Paternal Grandmother   . Lung cancer Paternal Grandfather     Social History   Tobacco Use  . Smoking status: Former Smoker    Packs/day: 0.50    Years: 7.00    Pack years: 3.50    Types: Cigarettes    Quit date:  07/15/2013    Years since quitting: 6.4  . Smokeless tobacco: Never Used  Substance Use Topics  . Alcohol use: Not Currently    Alcohol/week: 2.0 standard drinks    Types: 2 Standard drinks or equivalent per week    Comment: socially  . Drug use: No    Allergies:  Allergies  Allergen Reactions  . Clonazepam Other (See Comments)    Memory loss    Medications Prior to Admission  Medication Sig Dispense Refill Last Dose  . Accu-Chek Softclix Lancets lancets Use as instructed 100 each 12   . blood glucose meter kit and supplies KIT Dispense based on patient and insurance preference. Use up to four times daily as directed. (FOR ICD-9 250.00, 250.01). 1 each 0   . blood glucose meter kit and supplies KIT Dispense based on patient and insurance preference. Use up to four times daily as directed. (FOR ICD-9 250.00, 250.01). 1 each 0   . Prenatal Vit-Fe Fumarate-FA (PRENATAL MULTIVITAMIN) TABS tablet Take 1 tablet by mouth daily at 12 noon.     . sertraline (ZOLOFT) 100 MG tablet TAKE 1 TABLET  DAILY (150 MG TOTAL WITH 50 MG PRESCRIPTION) (Patient taking differently: Take 100 mg by mouth at bedtime. ) 90 tablet 0   . sertraline (ZOLOFT) 50 MG tablet TAKE 1 TABLET OF 50 MG DAILY. TAKE IN ADDITION TO 1 TABLET OF 100 MG FOR TOTAL DAILY DOSE OF 150 MG. (Patient taking differently: Take 50 mg by mouth at bedtime. ) 90 tablet 0     Review of Systems Physical Exam   Blood pressure (!) 141/80, pulse 79, temperature 98 F (36.7 C), temperature source Oral, resp. rate 17, height _0  (1.676 m), weight 97.1 kg, last menstrual period 04/15/2019, SpO2 98 %.  Physical Exam  GENERAL: Well-developed, well-nourished female in no acute distress.  HEENT: Normocephalic, atraumatic. Sclerae anicteric.  NECK: Supple. Normal thyroid.  LUNGS: Clear to auscultation bilaterally.  HEART: Regular rate and rhythm. BREASTS: Symmetric with everted nipples. No masses, skin changes, nipple drainage, or  lymphadenopathy. ABDOMEN: Soft, nontender, nondistended. No organomegaly., gravid, benign EXTREMITIES: No cyanosis, clubbing, or edema, 2+ distal pulses. BPP 6 out of 8 and new NST reactive, so BPP is 8 out of 10   MAU Course  Procedures    Assessment and Plan  Reassuring monitoring at 38 1/2 weeks She will get RLTCS this Saturday, repeat NST on Thursday.  Emily Filbert 01/08/2020, 8:48 PM

## 2020-01-08 NOTE — Progress Notes (Signed)
Pt here for NST only.  Strip is reassuring but not reactive.  Dr Hulan Fray spoke with the patient and requested that she go to MAU for a BPP.  If everything is fine then she will have her C-Section on SAT if not may be on for today.  Pt voices understanding.  Dr Guss Bunde states that she will call MAU and she is the on call Dr Orlene Och.

## 2020-01-08 NOTE — MAU Note (Signed)
.   DANEKA MCNEAR is a 34 y.o. at [redacted]w[redacted]d here in MAU reporting: that she  Was sent from the office for non reactive NST. Denies any pain. +FM  Onset of complaint: none Pain score: 0 Vitals:   01/08/20 1815  BP: 134/79  Pulse: 79  Resp: 16  Temp: 98.2 F (36.8 C)  SpO2: 100%     FHT:138 Lab orders placed from triage: [facility]

## 2020-01-08 NOTE — H&P (Signed)
  Zoe Wiley is an 34 y.o. [phone] [redacted]w[redacted]d female.   Chief Complaint: Prior C-section HPI: Prior C-section x 1, desires repeat with sterilization.  Past Medical History:  Diagnosis Date  . ADD (attention deficit disorder) 01/29/2015  . Anxiety   . Cervical high risk human papillomavirus (HPV) DNA test positive 01/05/2018  . Depression   . 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 Former cigarette smoker 12/02/2017  . Generalized anxiety disorder 06/25/2012   Child of alcmot   . Gestational diabetes   . Gestational diabetes mellitus (GDM) controlled on oral hypoglycemic drug 11/08/2016  . History of gestational hypertension   . 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 RBBB 12/21/2014  . Syncope 12/21/2014    Past Surgical History:  Procedure Laterality Date  . CESAREAN SECTION N/A 11/09/2016   Procedure: CESAREAN SECTION;  Surgeon: Cheri Fowler, MD;  Location: [facility];  Service: Obstetrics;  Laterality: N/A;  . FRACTURE SURGERY     bilateral wrist    Family History  Problem Relation Age of Onset  . Arrhythmia Father        atrial fibrillation  . Hypertension Father   . Breast cancer Paternal Grandmother   . Lung cancer Paternal Grandfather    Social History:  reports that she quit smoking about 6 years ago. Her smoking use included cigarettes. She has a 3.50 pack-year smoking history. She has never used smokeless tobacco. She reports previous alcohol use of about 2.0 standard drinks of alcohol per week. She reports that she does not use drugs.    Allergies  Allergen Reactions  . Clonazepam Other (See Comments)    Memory loss    No medications prior to admission.     A comprehensive review of systems was negative.  Last menstrual period 04/15/2019. General appearance: alert Head: Normocephalic, without obvious  abnormality, atraumatic Neck: supple, symmetrical, trachea midline Lungs: normal effort Heart: regular rate and rhythm Abdomen: gravid, non-tender Extremities: Homans sign is negative, no sign of DVT Skin: Skin color, texture, turgor normal. No rashes or lesions Neurologic: Grossly normal   Lab Results  Component Value Date   WBC 6.9 10/17/2019   HGB 10.9 (L) 10/17/2019   HCT 32.3 (L) 10/17/2019   MCV 89.0 10/17/2019   PLT 198 10/17/2019         ABO, Rh: A/RH(D) POSITIVE/-- (10/06 1533)  Antibody: NO ANTIBODIES DETECTED (10/06 1533)  Rubella: 1.26 (10/06 1533)  RPR: NON-REACTIVE (01/05 0908)  HBsAg: NON-REACTIVE (10/06 1533)  HIV: NON-REACTIVE (01/05 0908)  GBS:       Assessment/Plan Active Problems:   Gestational diabetes mellitus (GDM)   History of cesarean delivery affecting pregnancy  For RCS with bilateral salpingectomy. Risks include but are not limited to bleeding, infection, injury to surrounding structures, including bowel, bladder and ureters, blood clots, and death.  Likelihood of success is high. Patient counseled, r.e. Risks benefits of BTL, including permanency of procedure.    Zoe Wiley 01/08/2020, 11:06 AM

## 2020-01-11 ENCOUNTER — Other Ambulatory Visit: Payer: Self-pay

## 2020-01-11 ENCOUNTER — Other Ambulatory Visit (HOSPITAL_COMMUNITY)
Admission: RE | Admit: 2020-01-11 | Discharge: 2020-01-11 | Disposition: A | Discharge status: Home / Self Care | Payer: BC Managed Care – PPO | Source: Ambulatory Visit | Attending: Family Medicine | Admitting: Family Medicine

## 2020-01-11 ENCOUNTER — Ambulatory Visit (INDEPENDENT_AMBULATORY_CARE_PROVIDER_SITE_OTHER): Payer: BC Managed Care – PPO | Admitting: Obstetrics & Gynecology

## 2020-01-11 VITALS — Wt 216.0 lb

## 2020-01-11 DIAGNOSIS — Z3A38 38 weeks gestation of pregnancy: Secondary | ICD-10-CM | POA: Diagnosis not present

## 2020-01-11 DIAGNOSIS — O09293 Supervision of pregnancy with other poor reproductive or obstetric history, third trimester: Secondary | ICD-10-CM

## 2020-01-11 DIAGNOSIS — O24410 Gestational diabetes mellitus in pregnancy, diet controlled: Secondary | ICD-10-CM

## 2020-01-11 DIAGNOSIS — O34219 Maternal care for unspecified type scar from previous cesarean delivery: Secondary | ICD-10-CM | POA: Diagnosis not present

## 2020-01-11 DIAGNOSIS — Z20822 Contact with and (suspected) exposure to covid-19: Secondary | ICD-10-CM | POA: Diagnosis not present

## 2020-01-11 DIAGNOSIS — Z3480 Encounter for supervision of other normal pregnancy, unspecified trimester: Secondary | ICD-10-CM

## 2020-01-11 DIAGNOSIS — Z8759 Personal history of other complications of pregnancy, childbirth and the puerperium: Secondary | ICD-10-CM

## 2020-01-11 DIAGNOSIS — Z01812 Encounter for preprocedural laboratory examination: Secondary | ICD-10-CM | POA: Insufficient documentation

## 2020-01-11 HISTORY — DX: Personal history of other complications of pregnancy, childbirth and the puerperium: Z87.59

## 2020-01-11 LAB — CBC
HCT: 34.2 % — ABNORMAL LOW (ref 36.0–46.0)
Hemoglobin: 11.1 g/dL — ABNORMAL LOW (ref 12.0–15.0)
MCH: 28.4 pg (ref 26.0–34.0)
MCHC: 32.5 g/dL (ref 30.0–36.0)
MCV: 87.5 fL (ref 80.0–100.0)
Platelets: 159 10*3/uL (ref 150–400)
RBC: 3.91 MIL/uL (ref 3.87–5.11)
RDW: 13.5 % (ref 11.5–15.5)
WBC: 7.6 10*3/uL (ref 4.0–10.5)
nRBC: 0 % (ref 0.0–0.2)

## 2020-01-11 LAB — TYPE AND SCREEN
ABO/RH(D): A POS
Antibody Screen: NEGATIVE

## 2020-01-11 LAB — ABO/RH: ABO/RH(D): A POS

## 2020-01-11 LAB — COMPREHENSIVE METABOLIC PANEL
ALT: 15 U/L (ref 0–44)
AST: 17 U/L (ref 15–41)
Albumin: 2.8 g/dL — ABNORMAL LOW (ref 3.5–5.0)
Alkaline Phosphatase: 102 U/L (ref 38–126)
Anion gap: 11 (ref 5–15)
BUN: 11 mg/dL (ref 6–20)
CO2: 21 mmol/L — ABNORMAL LOW (ref 22–32)
Calcium: 9.9 mg/dL (ref 8.9–10.3)
Chloride: 105 mmol/L (ref 98–111)
Creatinine, Ser: 0.67 mg/dL (ref 0.44–1.00)
GFR calc Af Amer: >60 mL/min (ref >60)
GFR calc non Af Amer: >60 mL/min (ref >60)
Glucose, Bld: 94 mg/dL (ref 70–99)
Potassium: 4.1 mmol/L (ref 3.5–5.1)
Sodium: 137 mmol/L (ref 135–145)
Total Bilirubin: 0.2 mg/dL — ABNORMAL LOW (ref 0.3–1.2)
Total Protein: 6.4 g/dL — ABNORMAL LOW (ref 6.5–8.1)

## 2020-01-11 LAB — SARS CORONAVIRUS 2 (TAT 6-24 HRS): SARS Coronavirus 2: NEGATIVE

## 2020-01-11 NOTE — MAU Note (Signed)
Asymptomatic, swab collected. Lab called for blood draw.

## 2020-01-11 NOTE — Patient Instructions (Signed)
Cesarean Delivery Cesarean birth, or cesarean delivery, is the surgical delivery of a baby through an incision in the abdomen and the uterus. This may be referred to as a C-section. This procedure may be scheduled ahead of time, or it may be done in an emergency situation. Tell a health care provider about:  Any allergies you have.  All medicines you are taking, including vitamins, herbs, eye drops, creams, and over-the-counter medicines.  Any problems you or family members have had with anesthetic medicines.  Any blood disorders you have.  Any surgeries you have had.  Any medical conditions you have.  Whether you or any members of your family have a history of deep vein thrombosis (DVT) or pulmonary embolism (PE). What are the risks? Generally, this is a safe procedure. However, problems may occur, including:  Infection.  Bleeding.  Allergic reactions to medicines.  Damage to other structures or organs.  Blood clots.  Injury to your baby. What happens before the procedure? General instructions  Follow instructions from your health care provider about eating or drinking restrictions.  If you know that you are going to have a cesarean delivery, do not shave your pubic area. Shaving before the procedure may increase your risk of infection.  Plan to have someone take you home from the hospital.  Ask your health care provider what steps will be taken to prevent infection. These may include: ? Removing hair at the surgery site. ? Washing skin with a germ-killing soap. ? Taking antibiotic medicine.  Depending on the reason for your cesarean delivery, you may have a physical exam or additional testing, such as an ultrasound.  You may have your blood or urine tested. Questions for your health care provider  Ask your health care provider about: ? Changing or stopping your regular medicines. This is especially important if you are taking diabetes medicines or blood  thinners. ? Your pain management plan. This is especially important if you plan to breastfeed your baby. ? How long you will be in the hospital after the procedure. ? Any concerns you may have about receiving blood products, if you need them during the procedure. ? Cord blood banking, if you plan to collect your baby's umbilical cord blood.  You may also want to ask your health care provider: ? Whether you will be able to hold or breastfeed your baby while you are still in the operating room. ? Whether your baby can stay with you immediately after the procedure and during your recovery. ? Whether a family member or a person of your choice can go with you into the operating room and stay with you during the procedure, immediately after the procedure, and during your recovery. What happens during the procedure?   An IV will be inserted into one of your veins.  Fluid and medicines, such as antibiotics, will be given before the surgery.  Fetal monitors will be placed on your abdomen to check your baby's heart rate.  You may be given a special warming gown to wear to keep your temperature stable.  A catheter may be inserted into your bladder through your urethra. This drains your urine during the procedure.  You may be given one or more of the following: ? A medicine to numb the area (local anesthetic). ? A medicine to make you fall asleep (general anesthetic). ? A medicine (regional anesthetic) that is injected into your back or through a small thin tube placed in your back (spinal anesthetic or epidural anesthetic).   This numbs everything below the injection site and allows you to stay awake during your procedure. If this makes you feel nauseous, tell your health care provider. Medicines will be available to help reduce any nausea you may feel.  An incision will be made in your abdomen, and then in your uterus.  If you are awake during your procedure, you may feel tugging and pulling in  your abdomen, but you should not feel pain. If you feel pain, tell your health care provider immediately.  Your baby will be removed from your uterus. You may feel more pressure or pushing while this happens.  Immediately after birth, your baby will be dried and kept warm. You may be able to hold and breastfeed your baby.  The umbilical cord may be clamped and cut during this time. This usually occurs after waiting a period of 1-2 minutes after delivery.  Your placenta will be removed from your uterus.  Your incisions will be closed with stitches (sutures). Staples, skin glue, or adhesive strips may also be applied to the incision in your abdomen.  Bandages (dressings) may be placed over the incision in your abdomen. The procedure may vary among health care providers and hospitals. What happens after the procedure?  Your blood pressure, heart rate, breathing rate, and blood oxygen level will be monitored until you are discharged from the hospital.  You may continue to receive fluids and medicines through an IV.  You will have some pain. Medicines will be available to help control your pain.  To help prevent blood clots: ? You may be given medicines. ? You may have to wear compression stockings or devices. ? You will be encouraged to walk around when you are able.  Hospital staff will encourage and support bonding with your baby. Your hospital may have you and your baby to stay in the same room (rooming in) during your hospital stay to encourage successful bonding and breastfeeding.  You may be encouraged to cough and breathe deeply often. This helps to prevent lung problems.  If you have a catheter draining your urine, it will be removed as soon as possible after your procedure. Summary  Cesarean birth, or cesarean delivery, is the surgical delivery of a baby through an incision in the abdomen and the uterus.  Follow instructions from your health care provider about eating or  drinking restrictions before the procedure.  You will have some pain after the procedure. Medicines will be available to help control your pain.  Hospital staff will encourage and support bonding with your baby after the procedure. Your hospital may have you and your baby to stay in the same room (rooming in) during your hospital stay to encourage successful bonding and breastfeeding. This information is not intended to replace advice given to you by your health care provider. Make sure you discuss any questions you have with your health care provider. Document Revised: 04/04/2018 Document Reviewed: 04/04/2018 Elsevier Patient Education  [place]. Cesarean Delivery, Care After This sheet gives you information about how to care for yourself after your procedure. Your health care provider may also give you more specific instructions. If you have problems or questions, contact your health care provider. What can I expect after the procedure? After the procedure, it is common to have:  A small amount of blood or clear fluid coming from the incision.  Some redness, swelling, and pain in your incision area.  Some abdominal pain and soreness.  Vaginal bleeding (lochia). Even though  you did not have a vaginal delivery, you will still have vaginal bleeding and discharge.  Pelvic cramps.  Fatigue. You may have pain, swelling, and discomfort in the tissue between your vagina and your anus (perineum) if:  Your C-section was unplanned, and you were allowed to labor and push.  An incision was made in the area (episiotomy) or the tissue tore during attempted vaginal delivery. Follow these instructions at home: Incision care   Follow instructions from your health care provider about how to take care of your incision. Make sure you: ? Wash your hands with soap and water before you change your bandage (dressing). If soap and water are not available, use hand sanitizer. ? If you have a  dressing, change it or remove it as told by your health care provider. ? Leave stitches (sutures), skin staples, skin glue, or adhesive strips in place. These skin closures may need to stay in place for 2 weeks or longer. If adhesive strip edges start to loosen and curl up, you may trim the loose edges. Do not remove adhesive strips completely unless your health care provider tells you to do that.  Check your incision area every day for signs of infection. Check for: ? More redness, swelling, or pain. ? More fluid or blood. ? Warmth. ? Pus or a bad smell.  Do not take baths, swim, or use a hot tub until your health care provider says it's okay. Ask your health care provider if you can take showers.  When you cough or sneeze, hug a pillow. This helps with pain and decreases the chance of your incision opening up (dehiscing). Do this until your incision heals. Medicines  Take over-the-counter and prescription medicines only as told by your health care provider.  If you were prescribed an antibiotic medicine, take it as told by your health care provider. Do not stop taking the antibiotic even if you start to feel better.  Do not drive or use heavy machinery while taking prescription pain medicine. Lifestyle  Do not drink alcohol. This is especially important if you are breastfeeding or taking pain medicine.  Do not use any products that contain nicotine or tobacco, such as cigarettes, e-cigarettes, and chewing tobacco. If you need help quitting, ask your health care provider. Eating and drinking  Drink at least 8 eight-ounce glasses of water every day unless told not to by your health care provider. If you breastfeed, you may need to drink even more water.  Eat high-fiber foods every day. These foods may help prevent or relieve constipation. High-fiber foods include: ? Whole grain cereals and breads. ? Brown rice. ? Beans. ? Fresh fruits and vegetables. Activity   If possible, have  someone help you care for your baby and help with household activities for at least a few days after you leave the hospital.  Return to your normal activities as told by your health care provider. Ask your health care provider what activities are safe for you.  Rest as much as possible. Try to rest or take a nap while your baby is sleeping.  Do not lift anything that is heavier than 10 lbs (4.5 kg), or the limit that you were told, until your health care provider says that it is safe.  Talk with your health care provider about when you can engage in sexual activity. This may depend on your: ? Risk of infection. ? How fast you heal. ? Comfort and desire to engage in sexual activity. General instructions  Do not use tampons or douches until your health care provider approves.  Wear loose, comfortable clothing and a supportive and well-fitting bra.  Keep your perineum clean and dry. Wipe from front to back when you use the toilet.  If you pass a blood clot, save it and call your health care provider to discuss. Do not flush blood clots down the toilet before you get instructions from your health care provider.  Keep all follow-up visits for you and your baby as told by your health care provider. This is important. Contact a health care provider if:  You have: ? A fever. ? Bad-smelling vaginal discharge. ? Pus or a bad smell coming from your incision. ? Difficulty or pain when urinating. ? A sudden increase or decrease in the frequency of your bowel movements. ? More redness, swelling, or pain around your incision. ? More fluid or blood coming from your incision. ? A rash. ? Nausea. ? Little or no interest in activities you used to enjoy. ? Questions about caring for yourself or your baby.  Your incision feels warm to the touch.  Your breasts turn red or become painful or hard.  You feel unusually sad or worried.  You vomit.  You pass a blood clot from your vagina.  You  urinate more than usual.  You are dizzy or light-headed. Get help right away if:  You have: ? Pain that does not go away or get better with medicine. ? Chest pain. ? Difficulty breathing. ? Blurred vision or spots in your vision. ? Thoughts about hurting yourself or your baby. ? New pain in your abdomen or in one of your legs. ? A severe headache.  You faint.  You bleed from your vagina so much that you fill more than one sanitary pad in one hour. Bleeding should not be heavier than your heaviest period. Summary  After the procedure, it is common to have pain at your incision site, abdominal cramping, and slight bleeding from your vagina.  Check your incision area every day for signs of infection.  Tell your health care provider about any unusual symptoms.  Keep all follow-up visits for you and your baby as told by your health care provider. This information is not intended to replace advice given to you by your health care provider. Make sure you discuss any questions you have with your health care provider. Document Revised: 04/06/2018 Document Reviewed: 04/06/2018 Elsevier Patient Education  [place].

## 2020-01-11 NOTE — Progress Notes (Signed)
   PRENATAL VISIT NOTE  Subjective:  Zoe Wiley is a 34 y.o. G3P1011 at [redacted]w[redacted]d being seen today for ongoing prenatal care.  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  Patient reports no complaints.  Contractions: Irritability. Vag. Bleeding: None.  Movement: Present. Denies leaking of fluid.   The following portions of the patient's history were reviewed and updated as appropriate: allergies, current medications, past family history, past medical history, past social history, past surgical history and problem list.   Objective:   Vitals:   01/11/20 1432  Weight: 216 lb (98 kg)  Wt 216 lb (98 kg)   LMP 04/15/2019   BMI 34.86 kg/m    Fetal Status:     Movement: Present     General:  Alert, oriented and cooperative. Patient is in no acute distress.  Skin: Skin is warm and dry. No rash noted.   Cardiovascular: Normal heart rate noted  Respiratory: Normal respiratory effort, no problems with respiration noted  Abdomen: Soft, gravid, appropriate for gestational age.  Pain/Pressure: Absent     Pelvic: Cervical exam deferred        Extremities: Normal range of motion.  Edema: Trace  Mental Status: Normal mood and affect. Normal  behavior. Normal judgment and thought content.   Assessment and Plan:  Pregnancy: G3P1011 at [redacted]w[redacted]d 1. Supervision of other normal pregnancy, antepartum NST reviewed and reactive.  2. History of cesarean delivery affecting pregnancy Reviewed preop instructions  Scheduled for repeat c-section 01/13/2020  3. History of gestational hypertension  4. Diet controlled gestational diabetes mellitus (GDM), antepartum Has not mapped out glucose for 2 weeks.  12/28/2019 Est. FW:    3448  gm    7 lb 10 oz      90  %  Term labor symptoms and general obstetric precautions including but not limited to vaginal bleeding, contractions, leaking of fluid and fetal movement were reviewed in detail with the patient. Please refer to After Visit Summary for other counseling recommendations.   Return in about 3 weeks (around 02/01/2020).  Future Appointments  Date Time Provider Department Center  01/11/2020  4:15 PM Lavonia Drafts, MD [facility]    Lavonia Drafts, MD

## 2020-01-12 LAB — RPR: RPR Ser Ql: NONREACTIVE

## 2020-01-12 NOTE — Anesthesia Preprocedure Evaluation (Signed)
Anesthesia Evaluation  Patient identified by MRN, date of birth, ID band Patient awake    Reviewed: Allergy & Precautions, NPO status , Patient's Chart, lab work & pertinent test results  Airway Mallampati: II  TM Distance: >3 FB Neck ROM: Full    Dental no notable dental hx. (+) Dental Advisory Given, Teeth Intact   Pulmonary former smoker,  Former smoker, quit 2014, 3.5 pack years    Pulmonary exam normal breath sounds clear to auscultation       Cardiovascular Normal cardiovascular exam Rhythm:Regular Rate:Normal  Hx PIH with last pregnancy 2018  Hx ectopic atrial rhythm, RBBB   Neuro/Psych PSYCHIATRIC DISORDERS Anxiety Depression negative neurological ROS  negative psych ROS   GI/Hepatic negative GI ROS, Neg liver ROS,   Endo/Other  diabetes, Gestationalobesity BMI 35 Diet controlled GDM  Renal/GU negative Renal ROS  negative genitourinary   Musculoskeletal negative musculoskeletal ROS (+)   Abdominal   Peds negative pediatric ROS (+)  Hematology  (+) Blood dyscrasia, anemia , hct 34.2, plt 152   Anesthesia Other Findings   Reproductive/Obstetrics (+) Pregnancy 1 prior c section 2018, desires sterilization                             Anesthesia Physical Anesthesia Plan  ASA: III  Anesthesia Plan: Spinal   Post-op Pain Management:    Induction:   PONV Risk Score and Plan: 3 and Ondansetron, Dexamethasone and Treatment may vary due to age or medical condition  Airway Management Planned: Natural Airway and Nasal Cannula  Additional Equipment: None  Intra-op Plan:   Post-operative Plan:   Informed Consent: I have reviewed the patients History and Physical, chart, labs and discussed the procedure including the risks, benefits and alternatives for the proposed anesthesia with the patient or authorized representative who has indicated his/her understanding and acceptance.        Plan Discussed with: CRNA  Anesthesia Plan Comments:         Anesthesia Quick Evaluation

## 2020-01-13 ENCOUNTER — Other Ambulatory Visit: Payer: Self-pay

## 2020-01-13 ENCOUNTER — Inpatient Hospital Stay (HOSPITAL_COMMUNITY): Payer: BC Managed Care – PPO | Admitting: Anesthesiology

## 2020-01-13 ENCOUNTER — Encounter (HOSPITAL_COMMUNITY): Payer: Self-pay | Admitting: Family Medicine

## 2020-01-13 ENCOUNTER — Encounter (HOSPITAL_COMMUNITY)
Admission: RE | Disposition: A | Discharge status: Home / Self Care | Payer: Self-pay | Source: Home / Self Care | Attending: Family Medicine

## 2020-01-13 ENCOUNTER — Inpatient Hospital Stay (HOSPITAL_COMMUNITY)
Admission: RE | Admit: 2020-01-13 | Discharge: 2020-01-15 | DRG: 785 | Disposition: A | Discharge status: Home / Self Care | Payer: BC Managed Care – PPO | Attending: Family Medicine | Admitting: Family Medicine

## 2020-01-13 DIAGNOSIS — Z3A39 39 weeks gestation of pregnancy: Secondary | ICD-10-CM | POA: Diagnosis not present

## 2020-01-13 DIAGNOSIS — O99344 Other mental disorders complicating childbirth: Secondary | ICD-10-CM | POA: Diagnosis present

## 2020-01-13 DIAGNOSIS — O34211 Maternal care for low transverse scar from previous cesarean delivery: Principal | ICD-10-CM | POA: Diagnosis present

## 2020-01-13 DIAGNOSIS — Z3A38 38 weeks gestation of pregnancy: Secondary | ICD-10-CM | POA: Diagnosis not present

## 2020-01-13 DIAGNOSIS — F418 Other specified anxiety disorders: Secondary | ICD-10-CM | POA: Diagnosis not present

## 2020-01-13 DIAGNOSIS — O24419 Gestational diabetes mellitus in pregnancy, unspecified control: Secondary | ICD-10-CM

## 2020-01-13 DIAGNOSIS — F411 Generalized anxiety disorder: Secondary | ICD-10-CM | POA: Diagnosis present

## 2020-01-13 DIAGNOSIS — Z98891 History of uterine scar from previous surgery: Secondary | ICD-10-CM

## 2020-01-13 DIAGNOSIS — E669 Obesity, unspecified: Secondary | ICD-10-CM | POA: Diagnosis present

## 2020-01-13 DIAGNOSIS — O99214 Obesity complicating childbirth: Secondary | ICD-10-CM | POA: Diagnosis not present

## 2020-01-13 DIAGNOSIS — Z8759 Personal history of other complications of pregnancy, childbirth and the puerperium: Secondary | ICD-10-CM

## 2020-01-13 DIAGNOSIS — O34219 Maternal care for unspecified type scar from previous cesarean delivery: Secondary | ICD-10-CM | POA: Diagnosis not present

## 2020-01-13 DIAGNOSIS — O24420 Gestational diabetes mellitus in childbirth, diet controlled: Secondary | ICD-10-CM | POA: Diagnosis present

## 2020-01-13 DIAGNOSIS — Z87891 Personal history of nicotine dependence: Secondary | ICD-10-CM

## 2020-01-13 DIAGNOSIS — Z302 Encounter for sterilization: Secondary | ICD-10-CM | POA: Diagnosis not present

## 2020-01-13 DIAGNOSIS — D649 Anemia, unspecified: Secondary | ICD-10-CM | POA: Diagnosis present

## 2020-01-13 DIAGNOSIS — O9902 Anemia complicating childbirth: Secondary | ICD-10-CM | POA: Diagnosis not present

## 2020-01-13 HISTORY — DX: History of uterine scar from previous surgery: Z98.891

## 2020-01-13 LAB — CBC
HCT: 33.5 % — ABNORMAL LOW (ref 36.0–46.0)
Hemoglobin: 10.4 g/dL — ABNORMAL LOW (ref 12.0–15.0)
MCH: 27.8 pg (ref 26.0–34.0)
MCHC: 31 g/dL (ref 30.0–36.0)
MCV: 89.6 fL (ref 80.0–100.0)
Platelets: 150 10*3/uL (ref 150–400)
RBC: 3.74 MIL/uL — ABNORMAL LOW (ref 3.87–5.11)
RDW: 13.6 % (ref 11.5–15.5)
WBC: 13.9 10*3/uL — ABNORMAL HIGH (ref 4.0–10.5)
nRBC: 0 % (ref 0.0–0.2)

## 2020-01-13 LAB — CREATININE, SERUM
Creatinine, Ser: 0.65 mg/dL (ref 0.44–1.00)
GFR calc Af Amer: >60 mL/min
GFR calc non Af Amer: >60 mL/min

## 2020-01-13 LAB — GLUCOSE, CAPILLARY: Glucose-Capillary: 89 mg/dL (ref 70–99)

## 2020-01-13 SURGERY — Surgical Case
Anesthesia: Spinal | Laterality: Bilateral | Wound class: Clean Contaminated

## 2020-01-13 MED ORDER — SCOPOLAMINE 1 MG/3DAYS TD PT72
MEDICATED_PATCH | TRANSDERMAL | Status: DC | PRN
  Administered 2020-01-13: 1 via TRANSDERMAL

## 2020-01-13 MED ORDER — COCONUT OIL OIL: 1.0000 "application " | TOPICAL_OIL | Status: DC | PRN

## 2020-01-13 MED ORDER — PHENYLEPHRINE HCL (PRESSORS) 10 MG/ML IV SOLN
INTRAVENOUS | Status: DC | PRN
  Administered 2020-01-13 (×3): 80 ug via INTRAVENOUS

## 2020-01-13 MED ORDER — HYDROMORPHONE HCL 1 MG/ML IJ SOLN: 0.2500 mg | INTRAMUSCULAR | Status: DC | PRN

## 2020-01-13 MED ORDER — WITCH HAZEL-GLYCERIN EX PADS: 1.0000 "application " | MEDICATED_PAD | CUTANEOUS | Status: DC | PRN

## 2020-01-13 MED ORDER — LACTATED RINGERS IV SOLN: INTRAVENOUS | Status: DC | PRN

## 2020-01-13 MED ORDER — NALBUPHINE HCL 10 MG/ML IJ SOLN: 5.0000 mg | Freq: Once | INTRAMUSCULAR | Status: DC | PRN

## 2020-01-13 MED ORDER — KETOROLAC TROMETHAMINE 30 MG/ML IJ SOLN: 30.0000 mg | Freq: Four times a day (QID) | INTRAMUSCULAR | Status: AC | PRN

## 2020-01-13 MED ORDER — CEFAZOLIN SODIUM-DEXTROSE 2-4 GM/100ML-% IV SOLN: 2.0000 g | INTRAVENOUS | Status: DC

## 2020-01-13 MED ORDER — OXYCODONE HCL 5 MG/5ML PO SOLN: 5.0000 mg | Freq: Once | ORAL | Status: DC | PRN

## 2020-01-13 MED ORDER — FENTANYL CITRATE (PF) 100 MCG/2ML IJ SOLN
INTRAMUSCULAR | Status: AC
  Filled 2020-01-13: qty 2

## 2020-01-13 MED ORDER — CEFAZOLIN SODIUM-DEXTROSE 2-3 GM-%(50ML) IV SOLR
INTRAVENOUS | Status: DC | PRN
  Administered 2020-01-13: 2 g via INTRAVENOUS

## 2020-01-13 MED ORDER — SERTRALINE HCL 50 MG PO TABS
50.0000 mg | ORAL_TABLET | Freq: Every day | ORAL | Status: DC
  Administered 2020-01-13 – 2020-01-14 (×2): 50 mg via ORAL
  Filled 2020-01-13 (×2): qty 1

## 2020-01-13 MED ORDER — MEPERIDINE HCL 25 MG/ML IJ SOLN: 6.2500 mg | INTRAMUSCULAR | Status: DC | PRN

## 2020-01-13 MED ORDER — DEXAMETHASONE SODIUM PHOSPHATE 4 MG/ML IJ SOLN
INTRAMUSCULAR | Status: AC
  Filled 2020-01-13: qty 1

## 2020-01-13 MED ORDER — SIMETHICONE 80 MG PO CHEW: 80.0000 mg | CHEWABLE_TABLET | ORAL | Status: DC | PRN

## 2020-01-13 MED ORDER — LACTATED RINGERS IV SOLN: INTRAVENOUS | Status: DC

## 2020-01-13 MED ORDER — NALOXONE HCL 4 MG/10ML IJ SOLN
1.0000 ug/kg/h | INTRAVENOUS | Status: DC | PRN
  Filled 2020-01-13: qty 5

## 2020-01-13 MED ORDER — SERTRALINE HCL 100 MG PO TABS
100.0000 mg | ORAL_TABLET | Freq: Every day | ORAL | Status: DC
  Administered 2020-01-13 – 2020-01-14 (×2): 100 mg via ORAL
  Filled 2020-01-13 (×2): qty 1

## 2020-01-13 MED ORDER — NALBUPHINE HCL 10 MG/ML IJ SOLN: 5.0000 mg | INTRAMUSCULAR | Status: DC | PRN

## 2020-01-13 MED ORDER — DIPHENHYDRAMINE HCL 25 MG PO CAPS: 25.0000 mg | ORAL_CAPSULE | ORAL | Status: DC | PRN

## 2020-01-13 MED ORDER — SENNOSIDES-DOCUSATE SODIUM 8.6-50 MG PO TABS
2.0000 | ORAL_TABLET | ORAL | Status: DC
  Administered 2020-01-13 – 2020-01-14 (×2): 2 via ORAL
  Filled 2020-01-13 (×2): qty 2

## 2020-01-13 MED ORDER — ONDANSETRON HCL 4 MG/2ML IJ SOLN
INTRAMUSCULAR | Status: DC | PRN
  Administered 2020-01-13: 4 mg via INTRAVENOUS

## 2020-01-13 MED ORDER — ONDANSETRON HCL 4 MG/2ML IJ SOLN
INTRAMUSCULAR | Status: AC
  Filled 2020-01-13: qty 2

## 2020-01-13 MED ORDER — IBUPROFEN 800 MG PO TABS
800.0000 mg | ORAL_TABLET | Freq: Four times a day (QID) | ORAL | Status: DC
  Administered 2020-01-14 – 2020-01-15 (×5): 800 mg via ORAL
  Filled 2020-01-13 (×5): qty 1

## 2020-01-13 MED ORDER — ONDANSETRON HCL 4 MG/2ML IJ SOLN: 4.0000 mg | Freq: Three times a day (TID) | INTRAMUSCULAR | Status: DC | PRN

## 2020-01-13 MED ORDER — EPHEDRINE SULFATE 50 MG/ML IJ SOLN
INTRAMUSCULAR | Status: DC | PRN
  Administered 2020-01-13 (×3): 10 mg via INTRAVENOUS

## 2020-01-13 MED ORDER — PRENATAL MULTIVITAMIN CH
1.0000 | ORAL_TABLET | Freq: Every day | ORAL | Status: DC
  Administered 2020-01-14 – 2020-01-15 (×2): 1 via ORAL
  Filled 2020-01-13 (×2): qty 1

## 2020-01-13 MED ORDER — KETOROLAC TROMETHAMINE 30 MG/ML IJ SOLN: 30.0000 mg | Freq: Once | INTRAMUSCULAR | Status: DC | PRN

## 2020-01-13 MED ORDER — SCOPOLAMINE 1 MG/3DAYS TD PT72: 1.0000 | MEDICATED_PATCH | Freq: Once | TRANSDERMAL | Status: DC

## 2020-01-13 MED ORDER — PHENYLEPHRINE HCL-NACL 20-0.9 MG/250ML-% IV SOLN
INTRAVENOUS | Status: AC
  Filled 2020-01-13: qty 250

## 2020-01-13 MED ORDER — OXYCODONE HCL 5 MG PO TABS: 5.0000 mg | ORAL_TABLET | Freq: Once | ORAL | Status: DC | PRN

## 2020-01-13 MED ORDER — BUPIVACAINE HCL (PF) 0.25 % IJ SOLN
INTRAMUSCULAR | Status: AC
  Filled 2020-01-13: qty 30

## 2020-01-13 MED ORDER — STERILE WATER FOR IRRIGATION IR SOLN
Status: DC | PRN
  Administered 2020-01-13: 1

## 2020-01-13 MED ORDER — ACETAMINOPHEN 500 MG PO TABS
1000.0000 mg | ORAL_TABLET | Freq: Four times a day (QID) | ORAL | Status: AC
  Administered 2020-01-13 – 2020-01-14 (×3): 1000 mg via ORAL
  Filled 2020-01-13 (×3): qty 2

## 2020-01-13 MED ORDER — ENOXAPARIN SODIUM 40 MG/0.4ML ~~LOC~~ SOLN
40.0000 mg | SUBCUTANEOUS | Status: DC
  Administered 2020-01-14 – 2020-01-15 (×2): 40 mg via SUBCUTANEOUS
  Filled 2020-01-13 (×2): qty 0.4

## 2020-01-13 MED ORDER — OXYCODONE HCL 5 MG PO TABS
5.0000 mg | ORAL_TABLET | ORAL | Status: DC | PRN
  Administered 2020-01-14 – 2020-01-15 (×7): 10 mg via ORAL
  Filled 2020-01-13 (×7): qty 2

## 2020-01-13 MED ORDER — FENTANYL CITRATE (PF) 100 MCG/2ML IJ SOLN
INTRAMUSCULAR | Status: DC | PRN
  Administered 2020-01-13: 15 ug via INTRATHECAL

## 2020-01-13 MED ORDER — CEFAZOLIN SODIUM-DEXTROSE 2-4 GM/100ML-% IV SOLN
INTRAVENOUS | Status: AC
  Filled 2020-01-13: qty 100

## 2020-01-13 MED ORDER — BUPIVACAINE HCL (PF) 0.25 % IJ SOLN
INTRAMUSCULAR | Status: DC | PRN
  Administered 2020-01-13: 30 mL

## 2020-01-13 MED ORDER — DEXAMETHASONE SODIUM PHOSPHATE 10 MG/ML IJ SOLN
INTRAMUSCULAR | Status: DC | PRN
  Administered 2020-01-13: 4 mg via INTRAVENOUS

## 2020-01-13 MED ORDER — NALOXONE HCL 0.4 MG/ML IJ SOLN: 0.4000 mg | INTRAMUSCULAR | Status: DC | PRN

## 2020-01-13 MED ORDER — SODIUM CHLORIDE 0.9 % IR SOLN
Status: DC | PRN
  Administered 2020-01-13: 1

## 2020-01-13 MED ORDER — MORPHINE SULFATE (PF) 0.5 MG/ML IJ SOLN
INTRAMUSCULAR | Status: DC | PRN
  Administered 2020-01-13: .15 mg via INTRATHECAL

## 2020-01-13 MED ORDER — SODIUM CHLORIDE 0.9% FLUSH: 3.0000 mL | INTRAVENOUS | Status: DC | PRN

## 2020-01-13 MED ORDER — SIMETHICONE 80 MG PO CHEW
80.0000 mg | CHEWABLE_TABLET | ORAL | Status: DC
  Administered 2020-01-13 – 2020-01-14 (×2): 80 mg via ORAL
  Filled 2020-01-13 (×2): qty 1

## 2020-01-13 MED ORDER — ACETAMINOPHEN 325 MG PO TABS
650.0000 mg | ORAL_TABLET | Freq: Four times a day (QID) | ORAL | Status: DC | PRN
  Administered 2020-01-14 – 2020-01-15 (×3): 650 mg via ORAL
  Filled 2020-01-13 (×4): qty 2

## 2020-01-13 MED ORDER — SOD CITRATE-CITRIC ACID 500-334 MG/5ML PO SOLN: 30.0000 mL | ORAL | Status: DC

## 2020-01-13 MED ORDER — OXYTOCIN 40 UNITS IN NORMAL SALINE INFUSION - SIMPLE MED: 2.5000 [IU]/h | INTRAVENOUS | Status: AC

## 2020-01-13 MED ORDER — KETOROLAC TROMETHAMINE 30 MG/ML IJ SOLN
30.0000 mg | Freq: Four times a day (QID) | INTRAMUSCULAR | Status: AC
  Administered 2020-01-13 – 2020-01-14 (×3): 30 mg via INTRAVENOUS
  Filled 2020-01-13 (×3): qty 1

## 2020-01-13 MED ORDER — DIPHENHYDRAMINE HCL 25 MG PO CAPS: 25.0000 mg | ORAL_CAPSULE | Freq: Four times a day (QID) | ORAL | Status: DC | PRN

## 2020-01-13 MED ORDER — TETANUS-DIPHTH-ACELL PERTUSSIS 5-2.5-18.5 LF-MCG/0.5 IM SUSP: 0.5000 mL | Freq: Once | INTRAMUSCULAR | Status: DC

## 2020-01-13 MED ORDER — DIPHENHYDRAMINE HCL 50 MG/ML IJ SOLN: 12.5000 mg | INTRAMUSCULAR | Status: DC | PRN

## 2020-01-13 MED ORDER — OXYTOCIN 40 UNITS IN NORMAL SALINE INFUSION - SIMPLE MED
INTRAVENOUS | Status: DC | PRN
  Administered 2020-01-13: 40 [IU] via INTRAVENOUS

## 2020-01-13 MED ORDER — PHENYLEPHRINE HCL-NACL 20-0.9 MG/250ML-% IV SOLN
INTRAVENOUS | Status: DC | PRN
  Administered 2020-01-13: 20 ug/min via INTRAVENOUS

## 2020-01-13 MED ORDER — OXYTOCIN 40 UNITS IN NORMAL SALINE INFUSION - SIMPLE MED
INTRAVENOUS | Status: AC
  Filled 2020-01-13: qty 1000

## 2020-01-13 MED ORDER — BUPIVACAINE IN DEXTROSE 0.75-8.25 % IT SOLN
INTRATHECAL | Status: DC | PRN
  Administered 2020-01-13: 1.4 mL via INTRATHECAL

## 2020-01-13 MED ORDER — SODIUM CHLORIDE 0.9 % IV SOLN: INTRAVENOUS | Status: DC | PRN

## 2020-01-13 MED ORDER — ZOLPIDEM TARTRATE 5 MG PO TABS: 5.0000 mg | ORAL_TABLET | Freq: Every evening | ORAL | Status: DC | PRN

## 2020-01-13 MED ORDER — MENTHOL 3 MG MT LOZG: 1.0000 | LOZENGE | OROMUCOSAL | Status: DC | PRN

## 2020-01-13 MED ORDER — SIMETHICONE 80 MG PO CHEW
80.0000 mg | CHEWABLE_TABLET | Freq: Three times a day (TID) | ORAL | Status: DC
  Administered 2020-01-13 – 2020-01-15 (×6): 80 mg via ORAL
  Filled 2020-01-13 (×6): qty 1

## 2020-01-13 MED ORDER — DIBUCAINE (PERIANAL) 1 % EX OINT: 1.0000 "application " | TOPICAL_OINTMENT | CUTANEOUS | Status: DC | PRN

## 2020-01-13 MED ORDER — PROMETHAZINE HCL 25 MG/ML IJ SOLN: 6.2500 mg | INTRAMUSCULAR | Status: DC | PRN

## 2020-01-13 MED ORDER — MORPHINE SULFATE (PF) 0.5 MG/ML IJ SOLN
INTRAMUSCULAR | Status: AC
  Filled 2020-01-13: qty 10

## 2020-01-13 SURGICAL SUPPLY — 40 items
APL SKNCLS STERI-STRIP NONHPOA (GAUZE/BANDAGES/DRESSINGS) ×1
BENZOIN TINCTURE PRP APPL 2/3 (GAUZE/BANDAGES/DRESSINGS) ×3 IMPLANT
CHLORAPREP W/TINT 26ML (MISCELLANEOUS) ×3 IMPLANT
CLAMP CORD UMBIL (MISCELLANEOUS) IMPLANT
CLOSURE STERI STRIP 1/2 X4 (GAUZE/BANDAGES/DRESSINGS) ×1 IMPLANT
CLOSURE WOUND 1/2 X4 (GAUZE/BANDAGES/DRESSINGS) ×1
CLOTH BEACON ORANGE TIMEOUT ST (SAFETY) ×3 IMPLANT
DRSG OPSITE POSTOP 4X10 (GAUZE/BANDAGES/DRESSINGS) ×3 IMPLANT
ELECT REM PT RETURN 9FT ADLT (ELECTROSURGICAL) ×3
ELECTRODE REM PT RTRN 9FT ADLT (ELECTROSURGICAL) ×1 IMPLANT
EXTRACTOR VACUUM M CUP 4 TUBE (SUCTIONS) IMPLANT
EXTRACTOR VACUUM M CUP 4' TUBE (SUCTIONS)
GLOVE BIOGEL PI IND STRL 7.0 (GLOVE) ×3 IMPLANT
GLOVE BIOGEL PI INDICATOR 7.0 (GLOVE) ×6
GLOVE ECLIPSE 7.0 STRL STRAW (GLOVE) ×3 IMPLANT
GOWN STRL REUS W/TWL LRG LVL3 (GOWN DISPOSABLE) ×9 IMPLANT
KIT ABG SYR 3ML LUER SLIP (SYRINGE) IMPLANT
NDL HYPO 25X5/8 SAFETYGLIDE (NEEDLE) IMPLANT
NEEDLE HYPO 22GX1.5 SAFETY (NEEDLE) ×3 IMPLANT
NEEDLE HYPO 25X5/8 SAFETYGLIDE (NEEDLE) IMPLANT
NS IRRIG 1000ML POUR BTL (IV SOLUTION) ×3 IMPLANT
PACK C SECTION WH (CUSTOM PROCEDURE TRAY) ×3 IMPLANT
PAD ABD 7.5X8 STRL (GAUZE/BANDAGES/DRESSINGS) ×3 IMPLANT
PAD OB MATERNITY 4.3X12.25 (PERSONAL CARE ITEMS) ×3 IMPLANT
PENCIL SMOKE EVAC W/HOLSTER (ELECTROSURGICAL) ×3 IMPLANT
RTRCTR C-SECT PINK 25CM LRG (MISCELLANEOUS) ×2 IMPLANT
SPONGE GAUZE 4X4 12PLY STER LF (GAUZE/BANDAGES/DRESSINGS) ×5 IMPLANT
STRIP CLOSURE SKIN 1/2X4 (GAUZE/BANDAGES/DRESSINGS) ×2 IMPLANT
SUT MNCRL 0 VIOLET CTX 36 (SUTURE) ×2 IMPLANT
SUT MON AB 3-0 SH 27 (SUTURE) ×3
SUT MON AB 3-0 SH27 (SUTURE) IMPLANT
SUT MONOCRYL 0 CTX 36 (SUTURE) ×6
SUT PLAIN 2 0 XLH (SUTURE) ×2 IMPLANT
SUT VIC AB 0 CTX 36 (SUTURE) ×3
SUT VIC AB 0 CTX36XBRD ANBCTRL (SUTURE) ×1 IMPLANT
SUT VIC AB 4-0 KS 27 (SUTURE) ×3 IMPLANT
SYR 30ML LL (SYRINGE) ×3 IMPLANT
TOWEL OR 17X24 6PK STRL BLUE (TOWEL DISPOSABLE) ×3 IMPLANT
TRAY FOLEY W/BAG SLVR 14FR LF (SET/KITS/TRAYS/PACK) ×3 IMPLANT
WATER STERILE IRR 1000ML POUR (IV SOLUTION) ×3 IMPLANT

## 2020-01-13 NOTE — Anesthesia Postprocedure Evaluation (Signed)
Anesthesia Post Note  Patient: Zoe Wiley  Procedure(s) Performed: CESAREAN SECTION WITH BILATERAL TUBAL LIGATION (Bilateral )     Patient location during evaluation: PACU Anesthesia Type: Spinal Level of consciousness: awake and alert and oriented Pain management: pain level controlled Vital Signs Assessment: post-procedure vital signs reviewed and stable Respiratory status: spontaneous breathing, nonlabored ventilation and respiratory function stable Cardiovascular status: blood pressure returned to baseline and stable Postop Assessment: no headache, no backache, spinal receding and patient able to bend at knees Anesthetic complications: no    Last Vitals:  Vitals:   01/13/20 1100 01/13/20 1115  BP: 125/68 138/76  Pulse: 72 83  Resp: 13 18  Temp:    SpO2: 100% 98%    Last Pain:  Vitals:   01/13/20 1115  TempSrc:   PainSc: 0-No pain   Pain Goal:                Epidural/Spinal Function Cutaneous sensation: Tingles (01/13/20 1115), Patient able to flex knees: Yes (01/13/20 1115), Patient able to lift hips off bed: Yes (01/13/20 1115), Back pain beyond tenderness at insertion site: No (01/13/20 1115), Progressively worsening motor and/or sensory loss: No (01/13/20 1115), Bowel and/or bladder incontinence post epidural: No (01/13/20 Pink)  Pervis Hocking

## 2020-01-13 NOTE — Transfer of Care (Signed)
Immediate Anesthesia Transfer of Care Note  Patient: Zoe Wiley  Procedure(s) Performed: CESAREAN SECTION WITH BILATERAL TUBAL LIGATION (Bilateral )  Patient Location: PACU  Anesthesia Type:Spinal  Level of Consciousness: awake and alert   Airway & Oxygen Therapy: Patient Spontanous Breathing  Post-op Assessment: Report given to RN and Post -op Vital signs reviewed and stable  Post vital signs: Reviewed  Last Vitals:  Vitals Value Taken Time  BP    Temp    Pulse 78 01/13/20 1026  Resp 15 01/13/20 1026  SpO2 100 % 01/13/20 1026  Vitals shown include unvalidated device data.  Last Pain:  Vitals:   01/13/20 0806  TempSrc: Oral  PainSc:          Complications: No apparent anesthesia complications

## 2020-01-13 NOTE — Lactation Note (Addendum)
This note was copied from a baby's chart. Lactation Consultation Note  Patient Name: Zoe Wiley [id number] Date: 01/13/2020 Reason for consult: Initial assessment;Term;Other (Comment)(LGA infant greater than 9 lbs.) P2, 11 hour termed female infant. Infant had 2 stools and 5 voids since birth. Mom's hx: Repeat C/S, GDM, anxiety, depression and ADD.  Per mom ,she has DEBP at home. Mom is experienced at breastfeeding and she breastfed her 1st child who is 3 years for 8 months. Per mom, she feels breastfeeding is going smoothly and enjoyable. This is mom's 6th time breastfeeding infant and most feedings have been 15 to 30 minutes in length, mom feels infant latches well at breast. Mom has no questions or concerns for LC at this time. Mom had infant latched on right breast using the cradle hold position when [facility] entered the room, mom was towards the end of the feeding,  Infant swallows were  observed and per mom, she only felt a tug, infant breastfeed for 45 minutes. Mom taught back hand expression and infant was given 5 mls of colostrum by spoon. Mom knows to breastfeed infant according to hunger cues, 8 to 12 times within 24 hours and not exceed 3 hours without breastfeeding infant. Parents will continue to do as much STS as possible  with infant.  Reviewed Baby & Me book's Breastfeeding Basics.  LC discussed breastfeeding resources after discharge: [facility], [facility] outpatient clinic and [facility] online breastfeeding support group.    Maternal Data Formula Feeding for Exclusion: No Has patient been taught Hand Expression?: Yes Does the patient have breastfeeding experience prior to this delivery?: Yes  Feeding Feeding Type: Breast Fed  LATCH Score Latch: Grasps breast easily, tongue down, lips flanged, rhythmical sucking.  Audible Swallowing: Spontaneous and intermittent  Type of Nipple: Everted at rest and after stimulation  Comfort (Breast/Nipple): Soft / non-tender  Hold  (Positioning): No assistance needed to correctly position infant at breast.  LATCH Score: 10  Interventions Interventions: Breast feeding basics reviewed;Breast compression;Skin to skin;Position options;Hand express;Expressed milk  Lactation Tools Discussed/Used WIC Program: No   Consult Status Consult Status: Follow-up Date: 01/14/20 Follow-up type: In-patient    Vicente Serene 01/13/2020, 9:30 PM

## 2020-01-13 NOTE — Discharge Summary (Addendum)
Postpartum Discharge Summary       Patient Name: Zoe Wiley DOB: 1986/04/07 MRN: [id number]  Date of admission: 01/13/2020 Delivering Provider: [name]   Date of discharge: 01/15/2020  Admitting diagnosis: History of cesarean section [Z98.891] Intrauterine pregnancy: [redacted]w[redacted]d    Secondary diagnosis:  Active Problems:   Generalized anxiety disorder   Depression with anxiety   History of gestational hypertension   Gestational diabetes mellitus (GDM)   History of cesarean delivery affecting pregnancy   History of cesarean section  Additional problems: None     Discharge diagnosis: Term Pregnancy Delivered and GDM A1                                                                                                Post partum procedures:None  Augmentation: NA  Complications: None  Hospital course:  Sceduled C/S   34y.o. yo G3P1011 at 372w0das admitted to the hospital 01/13/2020 for scheduled cesarean section with the following indication:Elective Repeat.  Membrane Rupture Time/Date: 9:30 AM ,01/13/2020   Patient delivered a Viable infant.01/13/2020  Details of operation can be found in separate operative note. Bilateral salpingectomy done. Pateint had an uncomplicated postpartum course.  She is ambulating, tolerating a regular diet, passing flatus, and urinating well. Patient is discharged home in stable condition on  01/15/20        Delivery time: 9:31 AM    Magnesium Sulfate received: No BMZ received: No Rhophylac:No MMR:No Transfusion:No  Physical exam  Vitals:   01/14/20 0447 01/14/20 1417 01/14/20 2231 01/15/20 0613  BP: 128/73 126/76 121/74 129/72  Pulse: 66 71 80 80  Resp: _0 Temp: 97.8 F (36.6 C) 98 F (36.7 C) 97.7 F (36.5 C) 97.6 F (36.4 C)  TempSrc: Oral Oral Axillary Oral  SpO2: 99% 100% 98% 99%  Weight:      Height:       General: alert and no distress Lochia: appropriate Uterine Fundus: firm Incision: Healing well with no  significant drainage, Dressing is clean, dry, and intact DVT Evaluation: No evidence of DVT seen on physical exam. No cords or calf tenderness. Labs: Lab Results  Component Value Date   WBC 8.8 01/14/2020   HGB 9.0 (L) 01/14/2020   HCT 27.7 (L) 01/14/2020   MCV 88.8 01/14/2020   PLT 128 (L) 01/14/2020   CMP Latest Ref Rng & Units 01/13/2020  Glucose 70 - 99 mg/dL -  BUN 6 - 20 mg/dL -  Creatinine 0.44 - 1.00 mg/dL 0.65  Sodium 135 - 145 mmol/L -  Potassium 3.5 - 5.1 mmol/L -  Chloride 98 - 111 mmol/L -  CO2 22 - 32 mmol/L -  Calcium 8.9 - 10.3 mg/dL -  Total Protein 6.5 - 8.1 g/dL -  Total Bilirubin 0.3 - 1.2 mg/dL -  Alkaline Phos 38 - 126 U/L -  AST 15 - 41 U/L -  ALT 0 - 44 U/L -   Edinburgh Score: Edinburgh Postnatal Depression Scale Screening Tool 01/13/2020  I have been able to laugh and see the funny side of  things. 0  I have looked forward with enjoyment to things. 0  I have blamed myself unnecessarily when things went wrong. 0  I have been anxious or worried for no good reason. 0  I have felt scared or panicky for no good reason. 0  Things have been getting on top of me. 0  I have been so unhappy that I have had difficulty sleeping. 0  I have felt sad or miserable. 0  I have been so unhappy that I have been crying. 0  The thought of harming myself has occurred to me. 0  Edinburgh Postnatal Depression Scale Total 0    Discharge instruction: per After Visit Summary and "Baby and Me Booklet".  After visit meds:  Allergies as of 01/15/2020      Reactions   Clonazepam Other (See Comments)   Memory loss      Medication List    STOP taking these medications   Accu-Chek Softclix Lancets lancets   blood glucose meter kit and supplies Kit   prenatal multivitamin Tabs tablet     TAKE these medications   oxyCODONE 5 MG immediate release tablet Commonly known as: Oxy IR/ROXICODONE Take 1-2 tablets (5-10 mg total) by mouth every 4 (four) hours as needed for  moderate pain.   sertraline 50 MG tablet Commonly known as: ZOLOFT TAKE 1 TABLET OF 50 MG DAILY. TAKE IN ADDITION TO 1 TABLET OF 100 MG FOR TOTAL DAILY DOSE OF 150 MG. What changed: See the new instructions.   sertraline 100 MG tablet Commonly known as: ZOLOFT TAKE 1 TABLET DAILY (150 MG TOTAL WITH 50 MG PRESCRIPTION) What changed: See the new instructions.       Diet: routine diet  Activity: Advance as tolerated. Pelvic rest for 6 weeks.   Outpatient follow up:4 weeks Follow up Appt: Future Appointments  Date Time Provider [place]  01/29/2020  8:30 AM [place] [facility]  02/26/2020  8:30 AM Anyanwu, Sallyanne Havers, MD [facility]   Follow up Visit:    Please schedule this patient for Postpartum visit in: 4 weeks with the following provider: Any provider Virtual For C/S patients schedule nurse incision check in weeks 2 weeks: yes High risk pregnancy complicated by: GDM Delivery mode:  CS Anticipated Birth Control:  salpingectomy done PP Procedures needed: GTT, BP check and incision check  Schedule Integrated BH visit: no     Newborn Data: Live born female Birth Weight: 4170g APGAR: 36, 9  Newborn Delivery   Birth date/time: 01/13/2020 09:31:00 Delivery type: C-Section, Low Transverse Trial of labor: No C-section categorization: Repeat      Baby Feeding: Breast Disposition:home with mother   01/15/2020 Lyndee Hensen, DO   Provider attestation I have seen and examined this patient and agree with above documentation in the resident's note.   NAIMAH YINGST is a 34 y.o. [id number] s/p RCS/BTL.  Pain is well controlled. Plan for birth control is bilateral tubal ligation. Method of Feeding: breast  PE:  Gen: well appearing Heart: reg rate Lungs: normal WOB Fundus firm Ext: no pain, no edema  Recent Labs    01/13/20 1255 01/14/20 0324  HGB 10.4* 9.0*  HCT 33.5* 27.7*    Assessment S/p RCS/BTL PPD # 2  Plan: -  discharge home - postpartum care discussed -incision check in 2 weeks, pp visit in 4-6 weeks -Rx pain meds, iron supplement, & stool softerner -refilled zoloft & buspar   Jorje Guild, NP 10:46 AM

## 2020-01-13 NOTE — Anesthesia Procedure Notes (Signed)
Spinal  Patient location during procedure: OR Start time: 01/13/2020 9:10 AM End time: 01/13/2020 9:15 AM Staffing Performed: anesthesiologist  Anesthesiologist: Pervis Hocking, DO Preanesthetic Checklist Completed: patient identified, IV checked, risks and benefits discussed, surgical consent, monitors and equipment checked, pre-op evaluation and timeout performed Spinal Block Patient position: sitting Prep: DuraPrep and site prepped and draped Patient monitoring: cardiac monitor, continuous pulse ox and blood pressure Approach: midline Location: L3-4 Injection technique: single-shot Needle Needle type: Pencan  Needle gauge: 24 G Needle length: 9 cm Assessment Sensory level: T6 Additional Notes Functioning IV was confirmed and monitors were applied. Sterile prep and drape, including hand hygiene and sterile gloves were used. The patient was positioned and the spine was prepped. The skin was anesthetized with lidocaine.  Free flow of clear CSF was obtained prior to injecting local anesthetic into the CSF.  The spinal needle aspirated freely following injection.  The needle was carefully withdrawn.  The patient tolerated the procedure well.

## 2020-01-13 NOTE — Op Note (Signed)
Zoe Wiley PROCEDURE DATE: 01/13/2020  PREOPERATIVE DIAGNOSES: Intrauterine pregnancy at [redacted]w[redacted]d weeks gestation; elective repeat; undesired fertility  POSTOPERATIVE DIAGNOSES: The same  PROCEDURE: Repeat Low Transverse Cesarean Section, Bilateral Tubal Salpingectomy  SURGEON:  Dr. Darron Doom - Primary Dr. Barrington Ellison - Fellow  ANESTHESIOLOGIST: Dr. Doroteo Glassman  INDICATIONS: Zoe Wiley is a 34 y.o. G3P1011 at [redacted]w[redacted]d here for cesarean section and bilateral tubal sterilization secondary to the indications listed under preoperative diagnoses; please see preoperative note for further details.  The risks of surgery were discussed with the patient including but were not limited to: bleeding which may require transfusion or reoperation; infection which may require antibiotics; injury to bowel, bladder, ureters or other surrounding organs; injury to the fetus; need for additional procedures including hysterectomy in the event of a life-threatening hemorrhage; placental abnormalities wth subsequent pregnancies, incisional problems, thromboembolic phenomenon and other postoperative/anesthesia complications.  Patient also desires permanent sterilization.  Other reversible forms of contraception were discussed with patient; she declines all other modalities. Risks of procedure discussed with patient including but not limited to: risk of regret, permanence of method, bleeding, infection, injury to surrounding organs and need for additional procedures.  Failure risk of 1-2% with increased risk of ectopic gestation if pregnancy occurs was also discussed with patient.  Also discussed possibility of post-tubal pain syndrome. The patient concurred with the proposed plan, giving informed written consent for the procedures.    FINDINGS:  Viable female infant in cephalic presentation. Large amount of clear amniotic fluid.  Intact placenta, three vessel cord.  Normal uterus, fallopian tubes and ovaries bilaterally.  Fallopian tubes were sterilized bilaterally. APGAR (1 MIN): 8   APGAR (5 MINS): 9   APGAR (10 MINS):     ANESTHESIA: Spinal INTRAVENOUS FLUIDS: 1000 ml ESTIMATED BLOOD LOSS: 487 mL URINE OUTPUT:  150 ml SPECIMENS: Placenta sent to L&D and bilateral fallopian tubes sent to pathology COMPLICATIONS: None immediate  PROCEDURE IN DETAIL:  The patient preoperatively received intravenous antibiotics and had sequential compression devices applied to her lower extremities.   She was then taken to the operating room where spinal anesthesia was administered and was found to be adequate. She was then placed in a dorsal supine position with a leftward tilt, and prepped and draped in a sterile manner.  A foley catheter was placed into her bladder and attached to constant gravity.  After an adequate timeout was performed, a Pfannenstiel skin incision was made with scalpel over her preexisting scar and carried through to the underlying layer of fascia. The fascia was incised in the midline, and this incision was extended bilaterally using the Mayo scissors.  Kocher clamps were applied to the superior aspect of the fascial incision and the underlying rectus muscles were dissected off bluntly.  A similar process was carried out on the inferior aspect of the fascial incision. The rectus muscles were separated in the midline and the peritoneum was entered bluntly. The Alexis self-retaining retractor was introduced into the abdominal cavity.  Attention was turned to the lower uterine segment where a low transverse hysterotomy was made with a scalpel and extended bilaterally bluntly.  The infant was successfully delivered, the cord was clamped and cut after one minute and the infant was handed over to the awaiting neonatology team. Uterine massage was then administered, and the placenta delivered intact with a three-vessel cord. The uterus was then cleared of clots and debris.  The hysterotomy was closed with 0 Monocryl in a  running locked fashion. Attention  was then turned to the fallopian tubes, where the left fallopian tube was identified and grasped with a Babcock clamp, and followed out to the fimbriated end.  A Kelly used to clamp the mesosalpinx and the tube removed. 3-0 Monocryl in Heaney stitch, followed by free tie ensured hemostasis.  A similar process was carried out on the right side allowing for bilateral tubal sterilization.  Good hemostasis was noted overall. The pelvis was cleared of all clot and debris. Hemostasis was confirmed on all surfaces.  The retractor was removed.  The peritoneum was closed with a 0 Monocryl running stitch. The fascia was then closed using 0 Vicryl in a running fashion.  The subcutaneous layer was irrigated, reapproximated with 2-0 plain gut running stitches. 30 mL 0.25% Marcaine injected around incision site. The skin was closed with a 4-0 Vicryl subcuticular stitch. The patient tolerated the procedure well. Sponge, instrument and needle counts were correct x 3.  She was taken to the recovery room in stable condition.   Barrington Ellison, MD [facility] Family Medicine Fellow, [facility] for [employer], [place]

## 2020-01-13 NOTE — Interval H&P Note (Signed)
History and Physical Interval Note:  01/13/2020 8:52 AM  Zoe Wiley  has presented today for surgery, with the diagnosis of RCS Undesired Fertility.  The various methods of treatment have been discussed with the patient and family. After consideration of risks, benefits and other options for treatment, the patient has consented to  Procedure(s): Weldon (Bilateral) as a surgical intervention.  The patient's history has been reviewed, patient examined, no change in status, stable for surgery.  I have reviewed the patient's chart and labs.  Questions were answered to the patient's satisfaction.     [name]

## 2020-01-14 LAB — CBC
HCT: 27.7 % — ABNORMAL LOW (ref 36.0–46.0)
Hemoglobin: 9 g/dL — ABNORMAL LOW (ref 12.0–15.0)
MCH: 28.8 pg (ref 26.0–34.0)
MCHC: 32.5 g/dL (ref 30.0–36.0)
MCV: 88.8 fL (ref 80.0–100.0)
Platelets: 128 10*3/uL — ABNORMAL LOW (ref 150–400)
RBC: 3.12 MIL/uL — ABNORMAL LOW (ref 3.87–5.11)
RDW: 13.8 % (ref 11.5–15.5)
WBC: 8.8 10*3/uL (ref 4.0–10.5)
nRBC: 0 % (ref 0.0–0.2)

## 2020-01-14 LAB — GLUCOSE, CAPILLARY: Glucose-Capillary: 74 mg/dL (ref 70–99)

## 2020-01-14 MED ORDER — FERROUS SULFATE 325 (65 FE) MG PO TABS
325.0000 mg | ORAL_TABLET | Freq: Two times a day (BID) | ORAL | Status: DC
  Administered 2020-01-14 – 2020-01-15 (×3): 325 mg via ORAL
  Filled 2020-01-14 (×3): qty 1

## 2020-01-14 NOTE — Progress Notes (Signed)
POSTPARTUM PROGRESS NOTE  Post Operative Day 1  Subjective:  Zoe Wiley is a 34 y.o. [id number] s/p rLTCS at [redacted]w[redacted]d.  No acute events overnight.  Pt denies problems with ambulating, voiding or po intake. She reports sharp pain at incision site when she stands up or sits down.  She denies nausea or vomiting.  Pain is well controlled.  She has not had flatus. She has not had bowel movement.  Lochia Moderate.   Objective: Blood pressure 128/73, pulse 66, temperature 97.8 F (36.6 C), temperature source Oral, resp. rate 18, height 5\' 6"  (1.676 m), weight 98 kg, last menstrual period 04/15/2019, SpO2 99 %, unknown if currently breastfeeding.  Physical Exam:  General: alert, cooperative and no distress Chest: no respiratory distress Heart:regular rate, distal pulses intact Abdomen: soft, nontender,  Uterine Fundus: firm, appropriately tender DVT Evaluation: No calf swelling or tenderness Extremities: no edema Skin: pressure dressing intact   Recent Labs    01/13/20 1255 01/14/20 0324  HGB 10.4* 9.0*  HCT 33.5* 27.7*    Assessment/Plan: Zoe Wiley is a 34 y.o. [id number] s/p rLTCS at [redacted]w[redacted]d   POD#1 - Doing well Contraception: BTS  Feeding: breast Dispo: Plan for discharge tomorrow. Anemia: ferrous sulfate PO BID initiated this morning    LOS: 1 day   Zoe Wiley, CNM 01/14/2020, 8:42 AM

## 2020-01-15 MED ORDER — DOCUSATE SODIUM 100 MG PO CAPS: 100.0000 mg | ORAL_CAPSULE | Freq: Two times a day (BID) | ORAL | 0 refills | Status: DC

## 2020-01-15 MED ORDER — OXYCODONE HCL 5 MG PO TABS: 5.0000 mg | ORAL_TABLET | ORAL | 0 refills | Status: DC | PRN

## 2020-01-15 MED ORDER — BUSPIRONE HCL 5 MG PO TABS: 5.0000 mg | ORAL_TABLET | Freq: Three times a day (TID) | ORAL | 0 refills | Status: DC | PRN

## 2020-01-15 MED ORDER — IBUPROFEN 800 MG PO TABS: 800.0000 mg | ORAL_TABLET | Freq: Four times a day (QID) | ORAL | 0 refills | Status: DC

## 2020-01-15 MED ORDER — SERTRALINE HCL 50 MG PO TABS: 150.0000 mg | ORAL_TABLET | Freq: Every day | ORAL | 0 refills | Status: DC

## 2020-01-15 MED ORDER — FERROUS SULFATE 325 (65 FE) MG PO TABS: 325.0000 mg | ORAL_TABLET | Freq: Two times a day (BID) | ORAL | 0 refills | Status: DC

## 2020-01-15 NOTE — Discharge Instructions (Addendum)
Postpartum Care After Cesarean Delivery This sheet gives you information about how to care for yourself from the time you deliver your baby to up to 6-12 weeks after delivery (postpartum period). Your health care provider may also give you more specific instructions. If you have problems or questions, contact your health care provider. Follow these instructions at home: Medicines  Take over-the-counter and prescription medicines only as told by your health care provider.  If you were prescribed an antibiotic medicine, take it as told by your health care provider. Do not stop taking the antibiotic even if you start to feel better.  Ask your health care provider if the medicine prescribed to you: ? Requires you to avoid driving or using heavy machinery. ? Can cause constipation. You may need to take actions to prevent or treat constipation, such as:  Drink enough fluid to keep your urine pale yellow.  Take over-the-counter or prescription medicines.  Eat foods that are high in fiber, such as beans, whole grains, and fresh fruits and vegetables.  Limit foods that are high in fat and processed sugars, such as fried or sweet foods. Activity  Gradually return to your normal activities as told by your health care provider.  Avoid activities that take a lot of effort and energy (are strenuous) until approved by your health care provider. Walking at a slow to moderate pace is usually safe. Ask your health care provider what activities are safe for you. ? Do not lift anything that is heavier than your baby or 10 lb (4.5 kg) as told by your health care provider. ? Do not vacuum, climb stairs, or drive a car for as long as told by your health care provider.  If possible, have someone help you at home until you are able to do your usual activities yourself.  Rest as much as possible. Try to rest or take naps while your baby is sleeping. Vaginal bleeding  It is normal to have vaginal bleeding  (lochia) after delivery. Wear a sanitary pad to absorb vaginal bleeding and discharge. ? During the first week after delivery, the amount and appearance of lochia is often similar to a menstrual period. ? Over the next few weeks, it will gradually decrease to a dry, yellow-brown discharge. ? For most women, lochia stops completely by 4-6 weeks after delivery. Vaginal bleeding can vary from woman to woman.  Change your sanitary pads frequently. Watch for any changes in your flow, such as: ? A sudden increase in volume. ? A change in color. ? Large blood clots.  If you pass a blood clot, save it and call your health care provider to discuss. Do not flush blood clots down the toilet before you get instructions from your health care provider.  Do not use tampons or douches until your health care provider says this is safe.  If you are not breastfeeding, your period should return 6-8 weeks after delivery. If you are breastfeeding, your period may return anytime between 8 weeks after delivery and the time that you stop breastfeeding. Perineal care   If your C-section (Cesarean section) was unplanned, and you were allowed to labor and push before delivery, you may have pain, swelling, and discomfort of the tissue between your vaginal opening and your anus (perineum). You may also have an incision in the tissue (episiotomy) or the tissue may have torn during delivery. Follow these instructions as told by your health care provider: ? Keep your perineum clean and dry as told by   your health care provider. Use medicated pads and pain-relieving sprays and creams as directed. ? If you have an episiotomy or vaginal tear, check the area every day for signs of infection. Check for:  Redness, swelling, or pain.  Fluid or blood.  Warmth.  Pus or a bad smell. ? You may be given a squirt bottle to use instead of wiping to clean the perineum area after you go to the bathroom. As you start healing, you may use  the squirt bottle before wiping yourself. Make sure to wipe gently. ? To relieve pain caused by an episiotomy, vaginal tear, or hemorrhoids, try taking a warm sitz bath 2-3 times a day. A sitz bath is a warm water bath that is taken while you are sitting down. The water should only come up to your hips and should cover your buttocks. Breast care  Within the first few days after delivery, your breasts may feel heavy, full, and uncomfortable (breast engorgement). You may also have milk leaking from your breasts. Your health care provider can suggest ways to help relieve breast discomfort. Breast engorgement should go away within a few days.  If you are breastfeeding: ? Wear a bra that supports your breasts and fits you well. ? Keep your nipples clean and dry. Apply creams and ointments as told by your health care provider. ? You may need to use breast pads to absorb milk leakage. ? You may have uterine contractions every time you breastfeed for several weeks after delivery. Uterine contractions help your uterus return to its normal size. ? If you have any problems with breastfeeding, work with your health care provider or a lactation consultant.  If you are not breastfeeding: ? Avoid touching your breasts as this can make your breasts produce more milk. ? Wear a well-fitting bra and use cold packs to help with swelling. ? Do not squeeze out (express) milk. This causes you to make more milk. Intimacy and sexuality  Ask your health care provider when you can engage in sexual activity. This may depend on your: ? Risk of infection. ? Healing rate. ? Comfort and desire to engage in sexual activity.  You are able to get pregnant after delivery, even if you have not had your period. If desired, talk with your health care provider about methods of family planning or birth control (contraception). Lifestyle  Do not use any products that contain nicotine or tobacco, such as cigarettes, e-cigarettes,  and chewing tobacco. If you need help quitting, ask your health care provider.  Do not drink alcohol, especially if you are breastfeeding. Eating and drinking   Drink enough fluid to keep your urine pale yellow.  Eat high-fiber foods every day. These may help prevent or relieve constipation. High-fiber foods include: ? Whole grain cereals and breads. ? Brown rice. ? Beans. ? Fresh fruits and vegetables.  Take your prenatal vitamins until your postpartum checkup or until your health care provider tells you it is okay to stop. General instructions  Keep all follow-up visits for you and your baby as told by your health care provider. Most women visit their health care provider for a postpartum checkup within the first 3-6 weeks after delivery. Contact a health care provider if you:  Feel unable to cope with the changes that a new baby brings to your life, and these feelings do not go away.  Feel unusually sad or worried.  Have breasts that are painful, hard, or turn red.  Have a fever.    Have trouble holding urine or keeping urine from leaking.  Have little or no interest in activities you used to enjoy.  Have not breastfed at all and you have not had a menstrual period for 12 weeks after delivery.  Have stopped breastfeeding and you have not had a menstrual period for 12 weeks after you stopped breastfeeding.  Have questions about caring for yourself or your baby.  Pass a blood clot from your vagina. Get help right away if you:  Have chest pain.  Have difficulty breathing.  Have sudden, severe leg pain.  Have severe pain or cramping in your abdomen.  Bleed from your vagina so much that you fill more than one sanitary pad in one hour. Bleeding should not be heavier than your heaviest period.  Develop a severe headache.  Faint.  Have blurred vision or spots in your vision.  Have a bad-smelling vaginal discharge.  Have thoughts about hurting yourself or your  baby. If you ever feel like you may hurt yourself or others, or have thoughts about taking your own life, get help right away. You can go to your nearest emergency department or call:  Your local emergency services (911 in the [place]  A suicide crisis helpline, such as the [facility] at 702-874-3247. This is open 24 hours a day. Summary  The period of time from when you deliver your baby to up to 6-12 weeks after delivery is called the postpartum period.  Gradually return to your normal activities as told by your health care provider.  Keep all follow-up visits for you and your baby as told by your health care provider. This information is not intended to replace advice given to you by your health care provider. Make sure you discuss any questions you have with your health care provider. Document Revised: 05/18/2018 Document Reviewed: 05/18/2018 Elsevier Patient Education  [place].       Anemia  Anemia is a condition in which you do not have enough red blood cells or hemoglobin. Hemoglobin is a substance in red blood cells that carries oxygen. When you do not have enough red blood cells or hemoglobin (are anemic), your body cannot get enough oxygen and your organs may not work properly. As a result, you may feel very tired or have other problems. What are the causes? Common causes of anemia include:  Excessive bleeding. Anemia can be caused by excessive bleeding inside or outside the body, including bleeding from the intestine or from periods in women.  Poor nutrition.  Long-lasting (chronic) kidney, thyroid, and liver disease.  Bone marrow disorders.  Cancer and treatments for cancer.  HIV (human immunodeficiency virus) and AIDS (acquired immunodeficiency syndrome).  Treatments for HIV and AIDS.  Spleen problems.  Blood disorders.  Infections, medicines, and autoimmune disorders that destroy red blood cells. What are the signs or  symptoms? Symptoms of this condition include:  Minor weakness.  Dizziness.  Headache.  Feeling heartbeats that are irregular or faster than normal (palpitations).  Shortness of breath, especially with exercise.  Paleness.  Cold sensitivity.  Indigestion.  Nausea.  Difficulty sleeping.  Difficulty concentrating. Symptoms may occur suddenly or develop slowly. If your anemia is mild, you may not have symptoms. How is this diagnosed? This condition is diagnosed based on:  Blood tests.  Your medical history.  A physical exam.  Bone marrow biopsy. Your health care provider may also check your stool (feces) for blood and may do additional testing to look for  the cause of your bleeding. You may also have other tests, including:  Imaging tests, such as a CT scan or MRI.  Endoscopy.  Colonoscopy. How is this treated? Treatment for this condition depends on the cause. If you continue to lose a lot of blood, you may need to be treated at a hospital. Treatment may include:  Taking supplements of iron, vitamin K93, or folic acid.  Taking a hormone medicine (erythropoietin) that can help to stimulate red blood cell growth.  Having a blood transfusion. This may be needed if you lose a lot of blood.  Making changes to your diet.  Having surgery to remove your spleen. Follow these instructions at home:  Take over-the-counter and prescription medicines only as told by your health care provider.  Take supplements only as told by your health care provider.  Follow any diet instructions that you were given.  Keep all follow-up visits as told by your health care provider. This is important. Contact a health care provider if:  You develop new bleeding anywhere in the body. Get help right away if:  You are very weak.  You are short of breath.  You have pain in your abdomen or chest.  You are dizzy or feel faint.  You have trouble concentrating.  You have bloody  or black, tarry stools.  You vomit repeatedly or you vomit up blood. Summary  Anemia is a condition in which you do not have enough red blood cells or enough of a substance in your red blood cells that carries oxygen (hemoglobin).  Symptoms may occur suddenly or develop slowly.  If your anemia is mild, you may not have symptoms.  This condition is diagnosed with blood tests as well as a medical history and physical exam. Other tests may be needed.  Treatment for this condition depends on the cause of the anemia. This information is not intended to replace advice given to you by your health care provider. Make sure you discuss any questions you have with your health care provider. Document Revised: 09/10/2017 Document Reviewed: 10/30/2016 Elsevier Patient Education  [place].

## 2020-01-16 ENCOUNTER — Encounter (HOSPITAL_COMMUNITY): Payer: Self-pay | Admitting: Family Medicine

## 2020-01-16 LAB — SURGICAL PATHOLOGY

## 2020-01-20 ENCOUNTER — Inpatient Hospital Stay (HOSPITAL_COMMUNITY): Admit: 2020-01-20 | Payer: Self-pay

## 2020-01-29 ENCOUNTER — Other Ambulatory Visit: Payer: Self-pay

## 2020-01-29 ENCOUNTER — Encounter: Payer: Self-pay | Admitting: *Deleted

## 2020-01-29 ENCOUNTER — Ambulatory Visit (INDEPENDENT_AMBULATORY_CARE_PROVIDER_SITE_OTHER): Payer: BC Managed Care – PPO | Admitting: *Deleted

## 2020-01-29 VITALS — BP 121/74 | HR 67 | Temp 98.4°F | Resp 16 | Ht 66.0 in | Wt 186.0 lb

## 2020-01-29 DIAGNOSIS — Z4889 Encounter for other specified surgical aftercare: Secondary | ICD-10-CM

## 2020-01-29 NOTE — Progress Notes (Signed)
Pt here for C/Section incision check.  Area is dry and steri-streps are intact.  Incision is closed.  Pt denies any pain and is ambulating well.  She did state that her Rt breast was a little tender on Sat and she felt like she had the flu but never took her temp.  The breast are symmetrical and without redness today.  Instructed to make sure she is feeding from that breast first and keeping it empty.  Encouraged to apply warm compresses when painful and to call if any redness or fever over 101.

## 2020-02-13 ENCOUNTER — Ambulatory Visit: Payer: BC Managed Care – PPO | Admitting: Certified Nurse Midwife

## 2020-02-14 ENCOUNTER — Other Ambulatory Visit: Payer: Self-pay | Admitting: Physician Assistant

## 2020-02-22 NOTE — Progress Notes (Deleted)
    [facility] Partum Visit Note  Zoe Wiley is a 34 y.o. G59P2012 female who presents for a postpartum visit. She is 6 weeks postpartum following a repeat cesarean section.  I have fully reviewed the prenatal and intrapartum course. The delivery was at 81 gestational weeks.  Anesthesia: spinal. Postpartum course has been unremarkable. Baby is doing well. Baby is feeding by {breast/bottle:69}. Bleeding {vag bleed:12292}. Bowel function is {normal:32111}. Bladder function is {normal:32111}. Patient {is/is not:9024} sexually active. Contraception method is tubal ligation. Postpartum depression screening: {gen negative/positive:315881}.  {Common ambulatory SmartLinks:19316}  Review of Systems {ros; complete:30496}    Objective:  currently breastfeeding.  General:  {gen appearance:16600}   Breasts:  {breast exam:1202::"inspection negative, no nipple discharge or bleeding, no masses or nodularity palpable"}  Lungs: {lung exam:16931}  Heart:  {heart exam:5510}  Abdomen: {abdomen exam:16834}   Vulva:  {labia exam:12198}  Vagina: {vagina exam:12200}  Cervix:  {cervix exam:14595}  Corpus: {uterus exam:12215}  Adnexa:  {adnexa exam:12223}  Rectal Exam: {rectal/vaginal exam:12274}        Assessment:    *** postpartum exam. Pap smear {done:10129} at today's visit.   Plan:   Essential components of care per ACOG recommendations:  1.  Mood and well being: Patient with {gen negative/positive:315881} depression screening today. Reviewed local resources for support.  - Patient {Action; does/does not:19097} use tobacco. ***If using tobacco we discussed reduction and for recently cessation risk of relapse - hx of drug use? {yes/no:20286}  *** If yes, discussed support systems  2. Infant care and feeding:  -Patient currently breastmilk feeding? {yes/no:20286} ***If breastmilk feeding discussed return to work and pumping. If needed, patient was provided letter for work to allow for every 2-3 hr pumping  breaks, and to be granted a private location to express breastmilk and refrigerated area to store breastmilk. Reviewed importance of draining breast regularly to support lactation. -Social determinants of health (SDOH) reviewed in EPIC. No concerns***The following needs were identified***  3. Sexuality, contraception and birth spacing - Patient {DOES_DOES JZ:4998275 want a pregnancy in the next year.  Desired family size is {NUMBER 1-10:22536} children.  - Reviewed forms of contraception in tiered fashion. Patient desired {PLAN CONTRACEPTION:313102} today.   - Discussed birth spacing of 18 months  4. Sleep and fatigue -Encouraged family/partner/community support of 4 hrs of uninterrupted sleep to help with mood and fatigue  5. Physical Recovery  - Discussed patients delivery*** and complications - Patient had a *** degree laceration, perineal healing reviewed. Patient expressed understanding - Patient has urinary incontinence? {yes/no:20286}*** Patient was referred to pelvic floor PT  - Patient {ACTION; IS/IS GI:087931 safe to resume physical and sexual activity  6.  Health Maintenance - Last pap smear done *** and was {Normal/abnormal wildcard:19619} with negative HPV. ***Mammogram  7. ***Chronic Disease - PCP follow up  Lyndal Rainbow, [facility]

## 2020-02-26 ENCOUNTER — Ambulatory Visit: Payer: BC Managed Care – PPO | Admitting: Obstetrics & Gynecology

## 2020-05-14 ENCOUNTER — Other Ambulatory Visit: Payer: Self-pay | Admitting: Physician Assistant

## 2020-05-30 ENCOUNTER — Ambulatory Visit (INDEPENDENT_AMBULATORY_CARE_PROVIDER_SITE_OTHER): Payer: BC Managed Care – PPO | Admitting: Medical-Surgical

## 2020-05-30 ENCOUNTER — Encounter: Payer: Self-pay | Admitting: Medical-Surgical

## 2020-05-30 VITALS — BP 144/90 | HR 79 | Temp 98.2°F | Ht 66.0 in | Wt 160.6 lb

## 2020-05-30 DIAGNOSIS — F909 Attention-deficit hyperactivity disorder, unspecified type: Secondary | ICD-10-CM

## 2020-05-30 DIAGNOSIS — F418 Other specified anxiety disorders: Secondary | ICD-10-CM

## 2020-05-30 MED ORDER — SERTRALINE HCL 50 MG PO TABS: 50.0000 mg | ORAL_TABLET | Freq: Every day | ORAL | 1 refills | Status: DC

## 2020-05-30 MED ORDER — AMPHETAMINE-DEXTROAMPHETAMINE 10 MG PO TABS: 10.0000 mg | ORAL_TABLET | Freq: Two times a day (BID) | ORAL | 0 refills | Status: DC

## 2020-05-30 MED ORDER — SERTRALINE HCL 100 MG PO TABS: 100.0000 mg | ORAL_TABLET | Freq: Every day | ORAL | 1 refills | Status: DC

## 2020-05-30 NOTE — Progress Notes (Signed)
Subjective:    CC: establish care, anxiety/depression, attention issues  HPI: Pleasant 34 year old female presenting today to establish care with a new provider.  She would also like to follow-up on anxiety/depression and attention issues.  She has a beautiful 41-year-old daughter, Alinda Sierras, with her today.  She is also a new mom of a 20-month-old little boy named Scientist, research (life sciences).  Anxiety/depression- taking Zoloft 150mg  daily, tolerating well without side effects. Some anxiety symptoms lately related to inattention and ability to focus. Getting frustrated easily at times.  Sleeping fairly well but does have some difficulty getting enough rest with a 41-month-old.  Denies SI/HI.  Adult ADHD-feels that her attention has gotten much worse since the birth of her second child.  Has trouble remembering small tasks and focusing to get things done.  Was previously treated with Adderall successfully but notes that extended release formulas were not tolerated well.  On extended release medications for ADD she felt like a zombie.  She did try Concerta but did not tolerate this.  Felt that the best regimen was instant release Adderall 2-3 times daily.  Would like to restart treatment for inattention.  I reviewed the past medical history, family history, social history, surgical history, and allergies today and no changes were needed.  Please see the problem list section below in epic for further details.  Past Medical History: Past Medical History:  Diagnosis Date  . ADD (attention deficit disorder) 01/29/2015  . Anxiety   . Cervical high risk human papillomavirus (HPV) DNA test positive 01/05/2018  . Depression   . 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 Former cigarette smoker 12/02/2017  . Generalized anxiety disorder 06/25/2012   Child of alcmot   . Gestational diabetes   . Gestational diabetes mellitus (GDM) controlled on oral hypoglycemic drug 11/08/2016  . History of gestational hypertension   . LGSIL on Pap smear of  cervix 12/02/2017   LOW-GRADE SQUAMOUS INTRAEPITHELIAL LESION (LSIL) ENCOMPASSING HPV/MILD DYSPLASIA/CIN 1 WITH A FEW CELLS SUSPICIOUS FOR A HIGH-GRADE LESION IDENTIFIED  . RBBB 12/21/2014  . Syncope 12/21/2014   Past Surgical History: Past Surgical History:  Procedure Laterality Date  . CESAREAN SECTION N/A 11/09/2016   Procedure: CESAREAN SECTION;  Surgeon: Cheri Fowler, MD;  Location: [facility];  Service: Obstetrics;  Laterality: N/A;  . CESAREAN SECTION WITH BILATERAL TUBAL LIGATION Bilateral 01/13/2020   Procedure: CESAREAN SECTION WITH BILATERAL TUBAL LIGATION;  Surgeon: [name], MD;  Location: [facility];  Service: Obstetrics;  Laterality: Bilateral;  . FRACTURE SURGERY     bilateral wrist   Social History: Social History   Socioeconomic History  . Marital status: Married    Spouse name: Not on file  . Number of children: Not on file  . Years of education: Not on file  . Highest education level: Not on file  Occupational History  . Not on file  Tobacco Use  . Smoking status: Former Smoker    Packs/day: 0.50    Years: 7.00    Pack years: 3.50    Types: Cigarettes    Quit date: 07/15/2013    Years since quitting: 6.8  . Smokeless tobacco: Never Used  Vaping Use  . Vaping Use: Never used  Substance and Sexual Activity  . Alcohol use: Not Currently    Alcohol/week: 2.0 standard drinks    Types: 2 Standard drinks or equivalent per week    Comment: socially  . Drug use: No  . Sexual activity: Yes    Partners: Male  Birth control/protection: None  Other Topics Concern  . Not on file  Social History Narrative  . Not on file   Social Determinants of Health   Financial Resource Strain:   . Difficulty of Paying Living Expenses: Not on file  Food Insecurity:   . Worried About Charity fundraiser in the Last Year: Not on file  . Ran Out of Food in the Last Year: Not on file  Transportation Needs:   . Lack of Transportation (Medical): Not on file  .  Lack of Transportation (Non-Medical): Not on file  Physical Activity:   . Days of Exercise per Week: Not on file  . Minutes of Exercise per Session: Not on file  Stress:   . Feeling of Stress : Not on file  Social Connections:   . Frequency of Communication with Friends and Family: Not on file  . Frequency of Social Gatherings with Friends and Family: Not on file  . Attends Religious Services: Not on file  . Active Member of Clubs or Organizations: Not on file  . Attends Archivist Meetings: Not on file  . Marital Status: Not on file   Family History: Family History  Problem Relation Age of Onset  . Arrhythmia Father        atrial fibrillation  . Hypertension Father   . Breast cancer Paternal Grandmother   . Lung cancer Paternal Grandfather    Allergies: Allergies  Allergen Reactions  . Clonazepam Other (See Comments)    Memory loss   Medications: See med rec.  Review of Systems: See HPI for pertinent positives and negatives.   Objective:    General: Well Developed, well nourished, and in no acute distress.  Neuro: Alert and oriented x3.  HEENT: Normocephalic, atraumatic.  Skin: Warm and dry. Cardiac: Regular rate and rhythm, no murmurs rubs or gallops, no lower extremity edema.  Respiratory: Clear to auscultation bilaterally. Not using accessory muscles, speaking in full sentences.   Impression and Recommendations:    1. Depression with anxiety Continue Zoloft 150 mg daily.  She is interested in counseling so we will refer to behavioral health.  She does have a counselor that she has picked out that she would like to see here in [place].  Advised patient if she has not heard from them for scheduling in 1 to 2 weeks, reach out to the counselor or let me know and we will try to facilitate contact. - Ambulatory referral to [facility]  2. Adult ADHD We will go ahead and restart Adderall at 10 mg twice daily.  Reviewed possible side effects and  expectations while on medication.  If sleeping becomes more troublesome, recommend taking afternoon dose a little earlier.  Monitor for appetite suppression.  Return for ADD follow up in 3-4 weeks (can be virtual). ___________________________________________ Clearnce Sorrel, DNP, APRN, FNP-BC [facility] [place]

## 2020-05-31 NOTE — Telephone Encounter (Signed)
Do you know what medication because I am doing referrals today so I haven't even looked at PA's since Wednesday.  I think I am doing PA's on Monday but I can get this one done quick if it needs it. - CF

## 2020-06-06 ENCOUNTER — Telehealth: Payer: Self-pay | Admitting: Medical-Surgical

## 2020-06-06 NOTE — Telephone Encounter (Signed)
Received fax for PA on Amphetamine-Dextroamphetamine sent through cover my meds and received authorization  CaseId: [id number] Valid: 05/06/20 - 06/05/21  Faxing to pharmacy so they will fill medication for patient . - CF

## 2020-06-21 ENCOUNTER — Telehealth (INDEPENDENT_AMBULATORY_CARE_PROVIDER_SITE_OTHER): Payer: BC Managed Care – PPO | Admitting: Medical-Surgical

## 2020-06-21 ENCOUNTER — Encounter: Payer: Self-pay | Admitting: Medical-Surgical

## 2020-06-21 DIAGNOSIS — F418 Other specified anxiety disorders: Secondary | ICD-10-CM

## 2020-06-21 DIAGNOSIS — F909 Attention-deficit hyperactivity disorder, unspecified type: Secondary | ICD-10-CM

## 2020-06-21 MED ORDER — SERTRALINE HCL 100 MG PO TABS: 100.0000 mg | ORAL_TABLET | Freq: Every day | ORAL | 1 refills | Status: DC

## 2020-06-21 MED ORDER — AMPHETAMINE-DEXTROAMPHETAMINE 10 MG PO TABS: ORAL_TABLET | ORAL | 0 refills | Status: DC

## 2020-06-21 MED ORDER — SERTRALINE HCL 50 MG PO TABS: 50.0000 mg | ORAL_TABLET | Freq: Every day | ORAL | 1 refills | Status: DC

## 2020-06-21 NOTE — Progress Notes (Signed)
Virtual Visit via Video Note  I connected with Zoe Wiley on 06/21/20 at  3:00 PM EDT by a video enabled telemedicine application and verified that I am speaking with the correct person using two identifiers.   I discussed the limitations of evaluation and management by telemedicine and the availability of in person appointments. The patient expressed understanding and agreed to proceed.  Patient location: home Provider locations: office  Subjective:    CC: ADHD followup  HPI: Pleasant 34 year old female presenting via [place] video visit for ADHD follow up. Started taking Adderall 10mg  IR BID about 3 weeks ago and reports she has noticed an improvement in her ability to focus. She takes the first dose around 8-9am with the second at lunch around 12-1pm. Some days she reports she feels like a third dose is needed as the effectiveness of her lunch dose is over around 4pm. Tolerating the medication well without side effects. Sleeping well at night. No change in appetite. Reports her anxiety symptoms have been much better since she has started the Adderall as inattention was bothering her quite a bit. Doing well on Zoloft 150mg  daily. Denies SI/HI.   Past medical history, Surgical history, Family history not pertinant except as noted below, Social history, Allergies, and medications have been entered into the medical record, reviewed, and corrections made.   Review of Systems: See HPI for pertinent positives and negatives.   Objective:    General: Speaking clearly in complete sentences without any shortness of breath.  Alert and oriented x3.  Normal judgment. No apparent acute distress.  Impression and Recommendations:    1. Adult ADHD Increasing Adderall to 10mg  IR 2-3 times daily. Ok to use third dose in the afternoon if needed but monitor for sleep interruption. Unable to tolerate XR formulation due to significant side effects and would like to stick with IR dosing.  2. Depression  with anxiety Continue Zoloft 150mg  daily. Refills sent to mail order pharmacy per patient request.   Return in about 4 weeks (around 07/19/2020) for ADHD follow up.  20 minutes of non-face-to-face time was provided during this encounter.  I discussed the assessment and treatment plan with the patient. The patient was provided an opportunity to ask questions and all were answered. The patient agreed with the plan and demonstrated an understanding of the instructions.   The patient was advised to call back or seek an in-person evaluation if the symptoms worsen or if the condition fails to improve as anticipated.  Clearnce Sorrel, DNP, APRN, FNP-BC [facility]

## 2020-07-02 ENCOUNTER — Encounter: Payer: Self-pay | Admitting: Medical-Surgical

## 2020-07-02 ENCOUNTER — Other Ambulatory Visit: Payer: Self-pay

## 2020-07-02 DIAGNOSIS — U071 COVID-19: Secondary | ICD-10-CM | POA: Insufficient documentation

## 2020-07-02 DIAGNOSIS — F411 Generalized anxiety disorder: Secondary | ICD-10-CM

## 2020-07-02 DIAGNOSIS — Z20822 Contact with and (suspected) exposure to covid-19: Secondary | ICD-10-CM

## 2020-07-02 MED ORDER — CLONAZEPAM 0.5 MG PO TABS
0.2500 mg | ORAL_TABLET | Freq: Two times a day (BID) | ORAL | 0 refills | Status: DC | PRN
Start: 2020-07-02 — End: 2020-11-04

## 2020-07-02 NOTE — Telephone Encounter (Signed)
MyChart message received this morning with reports that the patient has tested positive for COVID 19 using a home test. Her employer is requiring her to see her PCP for another COVID test as proof that she is truly positive. At this point, they want her to work from home but with her symptoms along with having to care for her two small children, this is not feasible. Contacted patient regarding her message. She has had a loss of smell/taste, burning sensation in the nose, rhinorrhea, random sweats, sore throat, and increased anxiety. She has been quarantining since 9/9 when her husband tested positive. She was able to care for him while he stayed away from the family in the bedroom. Now she has tested positive and is very anxious regarding transmission to her children who are 3.5y and 7m old. She was tearful on the phone and expressed considerable "mom guilt" that she was sad and crying in front of her kids. Discussed healthy emotional expression and how it is an important lesson for children to learn. She has an appointment set up for counseling but they couldn't get her in until October. She is taking her Zoloft as prescribed. Previously tried Buspar but it only worked for a short while. Treated with Clonazepam for years but it made her feel disconnected and have trouble with her memory. Symptoms are severe at this point and I feel that COVID is exacerbating her existing anxiety. Discussed treatment options with the patient. She feels that she does need something to help her through the severe anxiety right now. Sending in Clonazepam 0.25-0.5mg  BID as needed with strong caution to take sparingly and only when anxiety is severe. Avoid taking Clonazepam to help with sleep. Continue with plan to start counseling. She will complete a drive up COVID test at our office today. Work Quarry manager provided via Pharmacist, community for her employer to allow her the quarantine time away from work for both physical and cognitive rest.  Psychological ER resources information provided to patient via [employer]. Denies SI/HI at this time. Lactation status marked as breastfeeding but patient verified that she is not.   Clearnce Sorrel, DNP, APRN, FNP-BC [facility]

## 2020-07-04 LAB — SPECIMEN STATUS REPORT

## 2020-07-04 LAB — SARS-COV-2, NAA 2 DAY TAT

## 2020-07-04 LAB — NOVEL CORONAVIRUS, NAA: SARS-CoV-2, NAA: DETECTED — AB

## 2020-08-08 ENCOUNTER — Encounter: Payer: Self-pay | Admitting: Medical-Surgical

## 2020-08-08 ENCOUNTER — Telehealth (INDEPENDENT_AMBULATORY_CARE_PROVIDER_SITE_OTHER): Payer: BC Managed Care – PPO | Admitting: Medical-Surgical

## 2020-08-08 DIAGNOSIS — F418 Other specified anxiety disorders: Secondary | ICD-10-CM

## 2020-08-08 DIAGNOSIS — F909 Attention-deficit hyperactivity disorder, unspecified type: Secondary | ICD-10-CM

## 2020-08-08 MED ORDER — AMPHETAMINE-DEXTROAMPHETAMINE 10 MG PO TABS: ORAL_TABLET | ORAL | 0 refills | Status: DC

## 2020-08-08 NOTE — Progress Notes (Signed)
Virtual Visit via Video Note  I connected with Zoe Wiley on 08/08/20 at 10:30 AM EDT by a video enabled telemedicine application and verified that I am speaking with the correct person using two identifiers.   I discussed the limitations of evaluation and management by telemedicine and the availability of in person appointments. The patient expressed understanding and agreed to proceed.  Patient location: home Provider locations: office  Subjective:    CC: ADHD med restart  HPI: Pleasant 34 year old female presenting via [place] video visit to discuss restarting ADHD medications.  She was seen back in August with some significant depression and anxiety.  She was taking Zoloft 150 mg daily but this was not helping her symptoms then.  We did do a short course of sparing use of clonazepam which was very helpful.  Today she reports she is feeling significantly better.  She had her first counseling visit yesterday and was amazed at how helpful this was.  Now that she is in a better place mentally, she would like to get restarted back on her ADHD medication.  She was previously taking Adderall 10 mg twice daily but felt that the medication effectiveness wore off too early in the day.  A prescription was sent to increase the instant release Adderall to 10 mg 3 times daily but she reports she did not get that prescription.  She feels like it would be most beneficial to increase her morning dose to 20 mg and continue the 10 mg afternoon dose if it is needed.  She has tolerated Adderall well with no side effects.  Sleeping well and has not noticed any changes in appetite or weight.   Past medical history, Surgical history, Family history not pertinant except as noted below, Social history, Allergies, and medications have been entered into the medical record, reviewed, and corrections made.   Review of Systems: See HPI for pertinent positives and negatives.   Objective:    General: Speaking clearly  in complete sentences without any shortness of breath.  Alert and oriented x3.  Normal judgment. No apparent acute distress.  Impression and Recommendations:    1. Adult ADHD Restarting Adderall at 20 mg instant relief every morning with 10 mg instant release every afternoon as needed.  Refill sent.  2. Depression with anxiety Continue Zoloft 150 mg daily.  Recommend avoiding further use of clonazepam unless severe anxiety occurs.  Continue counseling as instructed.  If symptoms worsen, advised to return for further discussion.  I discussed the assessment and treatment plan with the patient. The patient was provided an opportunity to ask questions and all were answered. The patient agreed with the plan and demonstrated an understanding of the instructions.   The patient was advised to call back or seek an in-person evaluation if the symptoms worsen or if the condition fails to improve as anticipated.  20 minutes of non-face-to-face time was provided during this encounter.  Return in about 3 months (around 11/08/2020) for ADHD follow up.  Clearnce Sorrel, DNP, APRN, FNP-BC [facility]

## 2020-08-09 MED ORDER — AMPHETAMINE-DEXTROAMPHETAMINE 10 MG PO TABS: ORAL_TABLET | ORAL | 0 refills | Status: DC

## 2020-09-12 ENCOUNTER — Encounter: Payer: Self-pay | Admitting: Medical-Surgical

## 2020-09-12 MED ORDER — OSELTAMIVIR PHOSPHATE 75 MG PO CAPS: 75.0000 mg | ORAL_CAPSULE | Freq: Every day | ORAL | 0 refills | Status: DC

## 2020-11-04 ENCOUNTER — Telehealth (INDEPENDENT_AMBULATORY_CARE_PROVIDER_SITE_OTHER): Payer: BC Managed Care – PPO | Admitting: Medical-Surgical

## 2020-11-04 ENCOUNTER — Encounter: Payer: Self-pay | Admitting: Medical-Surgical

## 2020-11-04 DIAGNOSIS — F909 Attention-deficit hyperactivity disorder, unspecified type: Secondary | ICD-10-CM

## 2020-11-04 DIAGNOSIS — F418 Other specified anxiety disorders: Secondary | ICD-10-CM | POA: Diagnosis not present

## 2020-11-04 MED ORDER — AMPHETAMINE-DEXTROAMPHETAMINE 10 MG PO TABS: ORAL_TABLET | ORAL | 0 refills | Status: DC

## 2020-11-04 MED ORDER — SERTRALINE HCL 100 MG PO TABS: 100.0000 mg | ORAL_TABLET | Freq: Every day | ORAL | 1 refills | Status: DC

## 2020-11-04 MED ORDER — SERTRALINE HCL 50 MG PO TABS: 50.0000 mg | ORAL_TABLET | Freq: Every day | ORAL | 1 refills | Status: DC

## 2020-11-04 NOTE — Progress Notes (Signed)
Virtual Visit via Video Note  I connected with Zoe Wiley on 11/04/20 at  8:10 AM EST by a video enabled telemedicine application and verified that I am speaking with the correct person using two identifiers.   I discussed the limitations of evaluation and management by telemedicine and the availability of in person appointments. The patient expressed understanding and agreed to proceed.  Patient location: home Provider locations: office  Subjective:    CC: mood and ADHD follow up  HPI: Pleasant 35 year old female presenting via [place] video visit for follow up on mood and ADHD.  Mood- Taking Zoloft 150mg  daily, tolerating well without side effects. Feels that this is controlling her symptoms very well. Admits that she does have quite a bit going on at home with her kids recently testing positive for Flu A and her husband testoing positive for COVID last night. Denies SI/HI.   ADHD- Taking Adderall 10mg  IR tablets. Previously tried XR tablets and they made her feel bad. She is tolerating the IR much better and takes 20mg  every morning at 8am, 10mg  at 11am-12pm, and another 10mg  at 3-4pm. This regimen works well for her and she is able to focus. No interference with sleep schedule. Notes that she is doing well on appetite and no longer tends to binge eat as she used to.   Past medical history, Surgical history, Family history not pertinant except as noted below, Social history, Allergies, and medications have been entered into the medical record, reviewed, and corrections made.   Review of Systems: See HPI for pertinent positives and negatives.   Objective:    General: Speaking clearly in complete sentences without any shortness of breath.  Alert and oriented x3.  Normal judgment. No apparent acute distress.  Impression and Recommendations:    1. Depression with anxiety Well controlled. Continue Zoloft 150mg  daily.   2. Adult ADHD Well controlled. Continue Adderall as  prescribed.  I discussed the assessment and treatment plan with the patient. The patient was provided an opportunity to ask questions and all were answered. The patient agreed with the plan and demonstrated an understanding of the instructions.   The patient was advised to call back or seek an in-person evaluation if the symptoms worsen or if the condition fails to improve as anticipated.  20 minutes of non-face-to-face time was provided during this encounter.  Return in about 3 months (around 02/02/2021) for ADHD follow up.  Clearnce Sorrel, DNP, APRN, FNP-BC [facility]

## 2020-11-04 NOTE — Addendum Note (Signed)
Addended bySamuel Bouche on: 11/04/2020 03:56 PM   Modules accepted: Orders

## 2020-12-10 ENCOUNTER — Encounter: Payer: Self-pay | Admitting: Medical-Surgical

## 2020-12-10 NOTE — Telephone Encounter (Signed)
Left voicemail for patient to call us back.

## 2020-12-11 ENCOUNTER — Encounter: Payer: Self-pay | Admitting: Medical-Surgical

## 2020-12-11 ENCOUNTER — Ambulatory Visit: Payer: BC Managed Care – PPO | Admitting: Medical-Surgical

## 2020-12-11 ENCOUNTER — Other Ambulatory Visit: Payer: Self-pay

## 2020-12-11 VITALS — BP 160/94 | HR 81 | Temp 98.5°F | Ht 66.0 in | Wt 160.0 lb

## 2020-12-11 DIAGNOSIS — F411 Generalized anxiety disorder: Secondary | ICD-10-CM

## 2020-12-11 DIAGNOSIS — F418 Other specified anxiety disorders: Secondary | ICD-10-CM | POA: Diagnosis not present

## 2020-12-11 DIAGNOSIS — F410 Panic disorder [episodic paroxysmal anxiety] without agoraphobia: Secondary | ICD-10-CM | POA: Diagnosis not present

## 2020-12-11 DIAGNOSIS — F430 Acute stress reaction: Secondary | ICD-10-CM | POA: Diagnosis not present

## 2020-12-11 MED ORDER — CLONAZEPAM 0.5 MG PO TABS: 0.5000 mg | ORAL_TABLET | Freq: Two times a day (BID) | ORAL | 1 refills | Status: DC | PRN

## 2020-12-11 MED ORDER — QUETIAPINE FUMARATE 25 MG PO TABS: 25.0000 mg | ORAL_TABLET | Freq: Every day | ORAL | 1 refills | Status: DC

## 2020-12-11 NOTE — Telephone Encounter (Signed)
Patient scheduled.
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
## 2020-12-16 ENCOUNTER — Ambulatory Visit: Payer: BC Managed Care – PPO | Admitting: Medical-Surgical

## 2020-12-16 ENCOUNTER — Encounter: Payer: Self-pay | Admitting: Medical-Surgical

## 2020-12-16 DIAGNOSIS — F410 Panic disorder [episodic paroxysmal anxiety] without agoraphobia: Secondary | ICD-10-CM

## 2020-12-16 DIAGNOSIS — F411 Generalized anxiety disorder: Secondary | ICD-10-CM

## 2020-12-16 DIAGNOSIS — F418 Other specified anxiety disorders: Secondary | ICD-10-CM

## 2020-12-18 ENCOUNTER — Encounter: Payer: Self-pay | Admitting: Medical-Surgical

## 2020-12-18 ENCOUNTER — Telehealth (INDEPENDENT_AMBULATORY_CARE_PROVIDER_SITE_OTHER): Payer: BC Managed Care – PPO | Admitting: Medical-Surgical

## 2020-12-18 DIAGNOSIS — F430 Acute stress reaction: Secondary | ICD-10-CM

## 2020-12-18 DIAGNOSIS — F411 Generalized anxiety disorder: Secondary | ICD-10-CM | POA: Diagnosis not present

## 2020-12-18 DIAGNOSIS — F410 Panic disorder [episodic paroxysmal anxiety] without agoraphobia: Secondary | ICD-10-CM | POA: Diagnosis not present

## 2020-12-18 DIAGNOSIS — F418 Other specified anxiety disorders: Secondary | ICD-10-CM

## 2020-12-18 NOTE — Progress Notes (Signed)
Virtual Visit via Video Note  I connected with Zoe Wiley on 12/18/20 at  8:10 AM EST by a video enabled telemedicine application and verified that I am speaking with the correct person using two identifiers.   I discussed the limitations of evaluation and management by telemedicine and the availability of in person appointments. The patient expressed understanding and agreed to proceed.  Patient location: home Provider locations: office  Subjective:    CC: 1 week mood follow-up  HPI: Pleasant 35 year old female presenting via [place] video visit for 1 week follow-up on mood after being seen last week for anxiety/depression crisis.  She has had significant life stressors going on at home with 2 young children and is working through the process with CPS as well as difficulties with her marriage.  She is doing an increased frequency of therapy with her counselor and was seen last week and then again on Monday.  At our visit last week we did start Seroquel at bedtime to help with sleep and mood stabilization we also provided a short-term Klonopin twice daily as needed for severe anxiety.  She has continued taking Zoloft 150 mg daily.  She is using Klonopin approximately twice daily right now and plans to cut this back.  Doing much better with anxiety and depression since starting the new medications.  Notes this week is full of meetings with substance abuse assessment and CPS so she is feeling overwhelmed right now.  Plans to return to work on Monday 3/14 and will need FMLA paperwork completed to reflect the need for 1 to 2 days away from work weekly for doctor's appointments and follow-ups.  Denies SI/HI.   Past medical history, Surgical history, Family history not pertinant except as noted below, Social history, Allergies, and medications have been entered into the medical record, reviewed, and corrections made.   Review of Systems: See HPI for pertinent positives and negatives.   Depression  screen [facility] 2/9 12/18/2020 12/11/2020 11/04/2020 05/30/2020 04/17/2019  Decreased Interest 0 3 0 0 0  Down, Depressed, Hopeless 1 3 0 0 2  PHQ - 2 Score 1 6 0 0 2  Altered sleeping 0 3 0 0 2  Tired, decreased energy 0 3 0 0 3  Change in appetite 0 3 0 0 3  Feeling bad or failure about yourself  1 3 0 0 2  Trouble concentrating 0 3 0 3 1  Moving slowly or fidgety/restless 0 3 0 0 2  Suicidal thoughts 0 0 0 0 0  PHQ-9 Score 2 24 0 3 15  Difficult doing work/chores Not difficult at all Extremely dIfficult Not difficult at all Very difficult -   GAD 7 : Generalized Anxiety Score 12/18/2020 12/11/2020 11/04/2020 05/30/2020  Nervous, Anxious, on Edge 1 3 0 1  Control/stop worrying 0 3 0 0  Worry too much - different things 0 3 0 0  Trouble relaxing 0 3 1 0  Restless 0 3 0 2  Easily annoyed or irritable 0 3 0 1  Afraid - awful might happen 0 3 0 0  Total GAD 7 Score 1 21 1 4   Anxiety Difficulty Not difficult at all Extremely difficult Not difficult at all Somewhat difficult     Objective:    General: Speaking clearly in complete sentences without any shortness of breath.  Alert and oriented x3.  Normal judgment. No apparent acute distress.  Impression and Recommendations:    1. Depression with anxiety/panic attack Continue Zoloft 150 mg daily,  Seroquel 25 mg nightly as needed, and clonazepam 0.5 mg twice daily as needed.  Recommend limiting clonazepam to severe anxiety as needed.  Patient aware this will not be a long-term prescription for management of anxiety but if needed, we can refer to psychiatry for further management of benzodiazepines.  I discussed the assessment and treatment plan with the patient. The patient was provided an opportunity to ask questions and all were answered. The patient agreed with the plan and demonstrated an understanding of the instructions.   The patient was advised to call back or seek an in-person evaluation if the symptoms worsen or if the condition fails to  improve as anticipated.  20 minutes of non-face-to-face time was provided during this encounter.  Return in about 2 weeks (around 01/01/2021) for mood follow up.  Clearnce Sorrel, DNP, APRN, FNP-BC [facility]

## 2020-12-20 ENCOUNTER — Telehealth: Payer: Self-pay

## 2020-12-20 NOTE — Telephone Encounter (Signed)
Pt aware FMLA paperwork has been completed, faxed, and a fax confirmation that it went through successfully has been received. I told her the paperwork was ready for her to pick up and she requested we send it to her via MyChart instead of her coming by and picking it up. She states she is in [place] with her mother. I took the paperwork to [place] at the front desk who scanned the paperwork and then sent her a MyChart message with the paperwork attached.

## 2020-12-22 IMAGING — US US MFM OB FOLLOW-UP
1 series · 13 of 28 positions shown · non-contrast
Comparison: none

[Series 1: us mfm ob follow-up · 54 acquisitions, 13 frames shown]
[im 2/54]
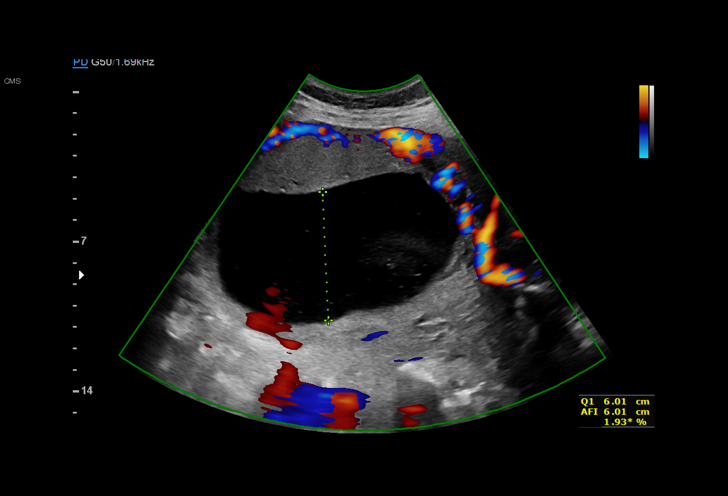
[im 6/54]
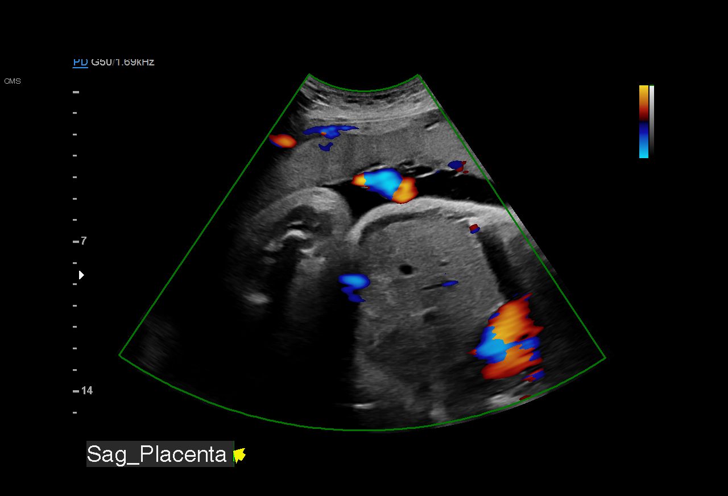
[im 10/54]
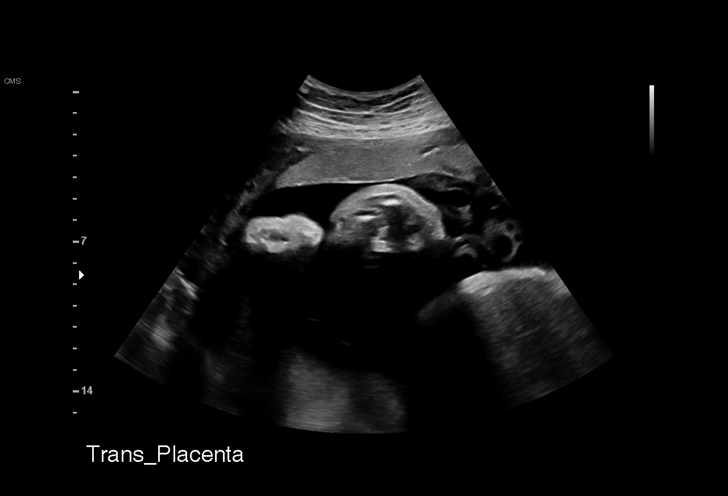
[im 14/54]
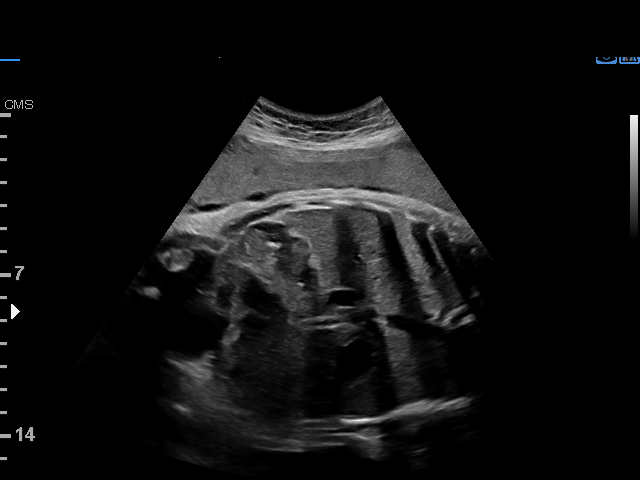
[im 18/54]
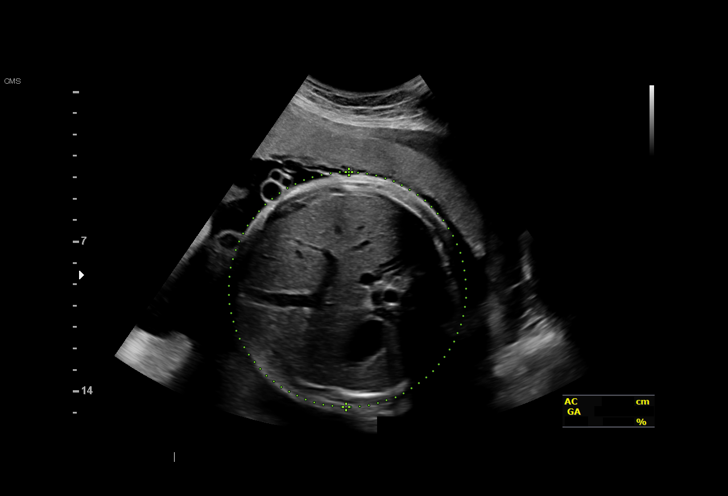
[im 22/54]
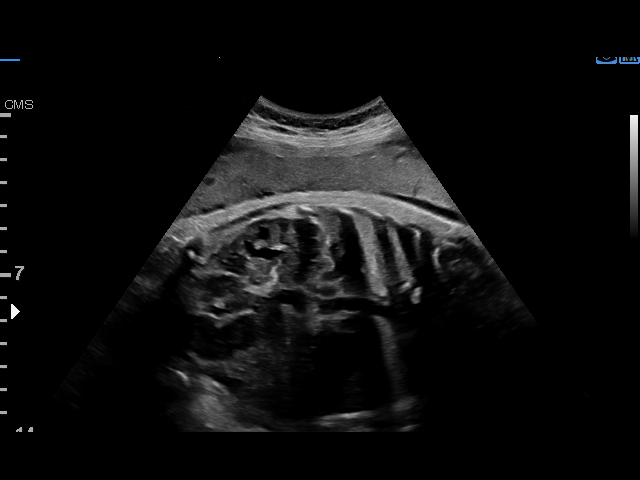
[im 28/54]
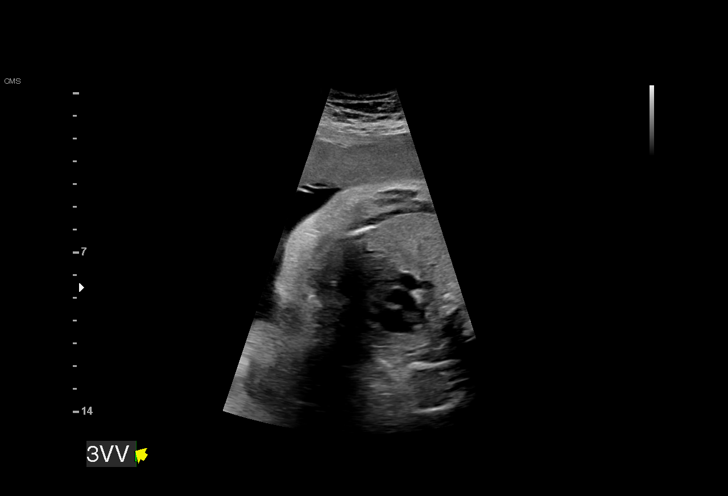
[im 32/54]
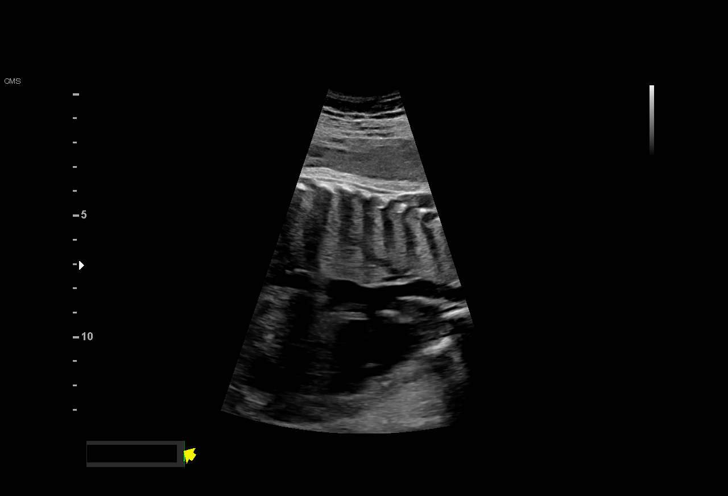
[im 36/54]
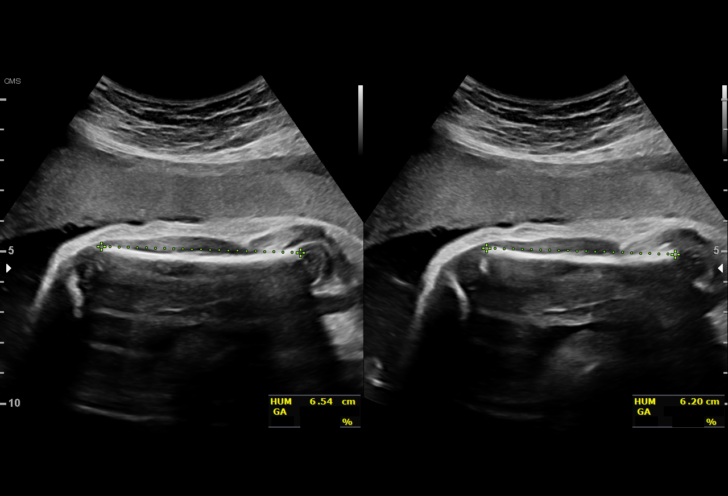
[im 40/54]
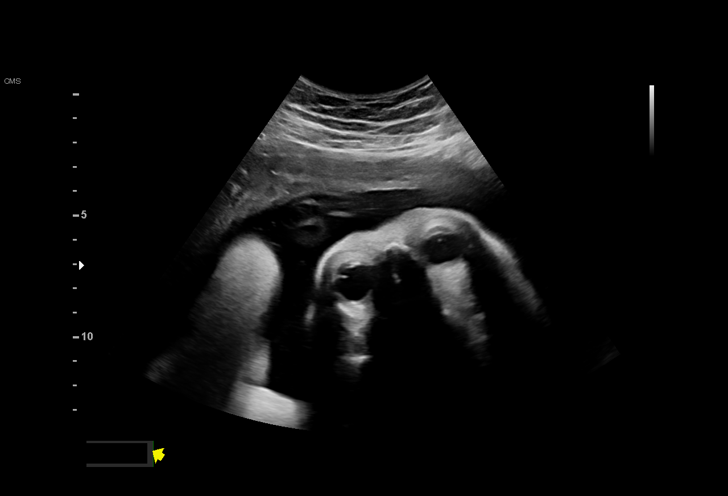
[im 44/54]
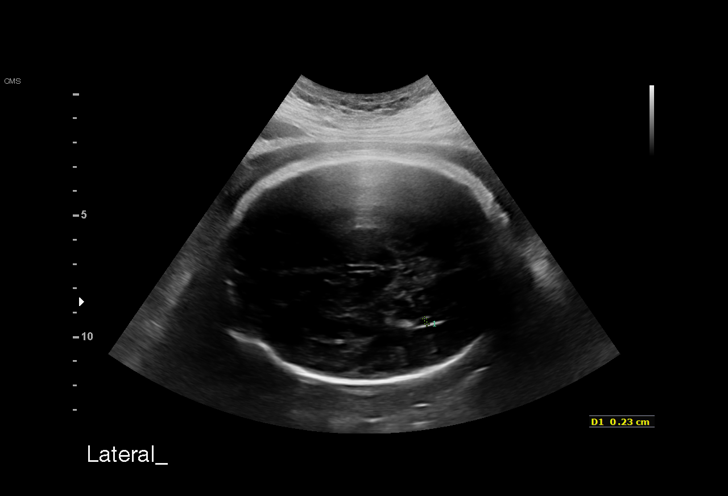
[im 48/54]
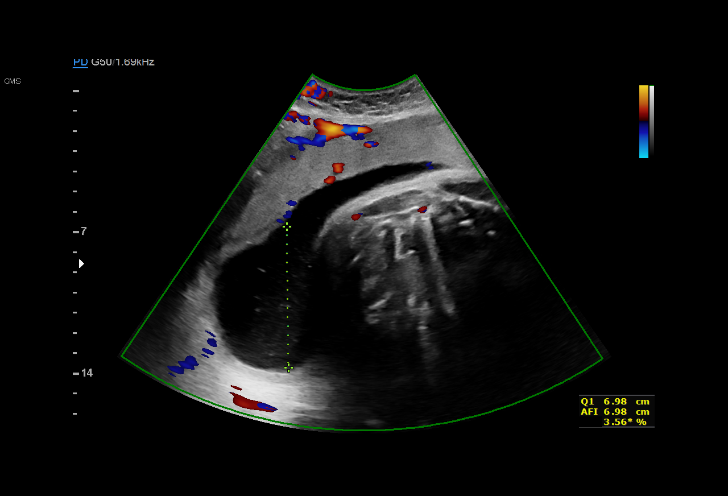
[im 52/54]
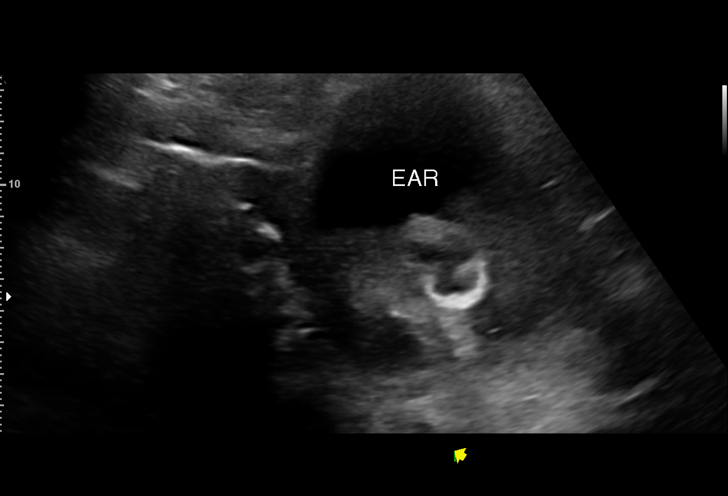

[13 of 28 positions shown; findings below may reference images not displayed]

----------------------------------------------------------------------

 ----------------------------------------------------------------------
Indications

  Gestational diabetes in pregnancy, diet
  controlled
  Encounter for other antenatal screening
  follow-up
  Previous cesarean delivery, antepartum
  Poor obstetric history: Previous
  preeclampsia / eclampsia/gestational HTN
  Poor obstetric history: Previous gestational
  diabetes
  36 weeks gestation of pregnancy
 ----------------------------------------------------------------------
Fetal Evaluation

 Num Of Fetuses:         1
 Fetal Heart Rate(bpm):  149
 Cardiac Activity:       Observed
 Presentation:           Cephalic
 Placenta:               Anterior
 P. Cord Insertion:      Visualized

 Amniotic Fluid
 AFI FV:      Polyhydramnios

 AFI Sum(cm)     %Tile       Largest Pocket(cm)
 29.44           > 97

 RUQ(cm)       RLQ(cm)       LUQ(cm)        LLQ(cm)

Biometry

 BPD:      93.7  mm     G. Age:  38w 1d         92  %    CI:        75.52   %    70 - 86
                                                         FL/HC:      20.9   %    20.8 -
 HC:      341.9  mm     G. Age:  39w 3d         84  %    HC/AC:      0.98        0.92 -
 AC:      348.3  mm     G. Age:  38w 5d         97  %    FL/BPD:     76.4   %    71 - 87
 FL:       71.6  mm     G. Age:  36w 5d         47  %    FL/AC:      20.6   %    20 - 24
 HUM:      63.9  mm     G. Age:  37w 0d         75  %

 Est. FW:    4001  gm    7 lb 10 oz      90  %
OB History

 Gravidity:    3         Term:   1        Prem:   0        SAB:   1
 TOP:          0       Ectopic:  0        Living: 1
Gestational Age

 LMP:           36w 5d        Date:  04/15/19                 EDD:   01/20/20
 U/S Today:     38w 2d                                        EDD:   01/09/20
 Best:          36w 5d     Det. By:  LMP  (04/15/19)          EDD:   01/20/20
Anatomy

 Cranium:               Appears normal         LVOT:                   Appears normal
 Cavum:                 Previously seen        Aortic Arch:            Previously seen
 Ventricles:            Appears normal         Ductal Arch:            Previously seen
 Choroid Plexus:        Previously seen        Diaphragm:              Previously seen
 Cerebellum:            Previously seen        Stomach:                Appears normal, left
                                                                       sided
 Posterior Fossa:       Previously seen        Abdomen:                Appears normal
 Nuchal Fold:           Previously seen        Abdominal Wall:         Previously seen
 Face:                  Appears normal         Cord Vessels:           Previously seen
                        (orbits and profile)
 Lips:                  Appears normal         Kidneys:                Appear normal
 Palate:                Not well visualized    Bladder:                Appears normal
 Thoracic:              Appears normal         Spine:                  Previously seen
 Heart:                 Previously seen        Upper Extremities:      Previously seen
 RVOT:                  Previously seen        Lower Extremities:      Previously seen

 Other:  Heels previously seen. Right 5th digit prev visualized. Nasal bone
         prev visualized. Technically difficult due to fetal position.
Cervix Uterus Adnexa

 Cervix
 Not visualized (advanced GA >28wks)

 Uterus
 No abnormality visualized.

 Left Ovary
 Not visualized.

 Right Ovary
 Not visualized.

 Cul De Sac
 No free fluid seen.

 Adnexa
 No abnormality visualized.
Comments

 This patient was seen for a follow up growth scan due to diet-
 controlled gestational diabetes.  She reports that her
 fingerstick values have mostly been within normal limits. She
 also reports feeling vigorous fetal movements throughout the
 day.
 The overall EFW measured today is at the 90th percentile for
 her gestational age.  Polyhydramnios with a total AFI of
 cm is noted.
 Due to polyhydramnios and gestational diabetes, the patient
 will start weekly fetal testing next week.  The NST and fluid
 checks will be done at the [HOSPITAL] office.
 She is already scheduled for a repeat cesarean delivery at
 around 39 weeks.

## 2020-12-31 ENCOUNTER — Encounter: Payer: Self-pay | Admitting: Medical-Surgical

## 2020-12-31 MED ORDER — AMPHETAMINE-DEXTROAMPHETAMINE 10 MG PO TABS: ORAL_TABLET | ORAL | 0 refills | Status: DC

## 2021-01-10 ENCOUNTER — Other Ambulatory Visit: Payer: Self-pay | Admitting: Medical-Surgical

## 2021-01-22 NOTE — Progress Notes (Signed)
Virtual Visit via Video Note  I connected with Zoe Wiley on 01/23/21 at  8:10 AM EDT by a video enabled telemedicine application and verified that I am speaking with the correct person using two identifiers.   I discussed the limitations of evaluation and management by telemedicine and the availability of in person appointments. The patient expressed understanding and agreed to proceed.  Patient location: home Provider locations: office  Subjective:    CC: mood follow up  HPI: Pleasant 35 year old female presenting via MyChart video visit for follow-up on anxiety and depression.  She has been doing well since our last visit.  Taking Zoloft 150 mg daily, tolerating well without side effects.  She is using Seroquel 25 mg nightly as needed and reports taking about 2-3 doses per week.  Notes the Seroquel helps her tremendously with sleep.  She has still been taking and sparing doses of clonazepam for COPD or anxiety but notes that she is only using it once to twice per week.  She continues doing counseling every 2 weeks as scheduled.  She has gone back to work and doing well with that.  She does still have quite a bit going on with her husband and children but there will be no quick fix for that and she seems to be handling things fairly well.  Denies SI/HI.   Past medical history, Surgical history, Family history not pertinant except as noted below, Social history, Allergies, and medications have been entered into the medical record, reviewed, and corrections made.   Review of Systems: See HPI for pertinent positives and negatives.   Depression screen [facility] 2/9 01/23/2021 12/18/2020 12/11/2020 11/04/2020 05/30/2020  Decreased Interest 0 0 3 0 0  Down, Depressed, Hopeless 0 1 3 0 0  PHQ - 2 Score 0 1 6 0 0  Altered sleeping 0 0 3 0 0  Tired, decreased energy 0 0 3 0 0  Change in appetite 0 0 3 0 0  Feeling bad or failure about yourself  0 1 3 0 0  Trouble concentrating 0 0 3 0 3  Moving slowly  or fidgety/restless 1 0 3 0 0  Suicidal thoughts 0 0 0 0 0  PHQ-9 Score 1 2 24  0 3  Difficult doing work/chores Somewhat difficult Not difficult at all Extremely dIfficult Not difficult at all Very difficult  Some recent data might be hidden   GAD 7 : Generalized Anxiety Score 01/23/2021 12/18/2020 12/11/2020 11/04/2020  Nervous, Anxious, on Edge 1 1 3  0  Control/stop worrying 0 0 3 0  Worry too much - different things 0 0 3 0  Trouble relaxing 1 0 3 1  Restless 0 0 3 0  Easily annoyed or irritable 1 0 3 0  Afraid - awful might happen 0 0 3 0  Total GAD 7 Score 3 1 21 1   Anxiety Difficulty Somewhat difficult Not difficult at all Extremely difficult Not difficult at all     Objective:    General: Speaking clearly in complete sentences without any shortness of breath.  Alert and oriented x3.  Normal judgment. No apparent acute distress.  Impression and Recommendations:    1. Depression with anxiety 2. Generalized anxiety disorder 3. Panic attack due to exceptional stress Continue Zoloft 150 mg daily.  Continue Seroquel 25 mg nightly as needed.  Counseled again on sparing use of clonazepam only for severe anxiety.  Plan to continue counseling.  No further interventions or medication changes at this time.  Did discuss the process involved in weaning off of Zoloft as she never wanted to be on medications for anxiety or depression to begin with.  At this point, I do not think it would be a great idea to reduce her dose or start the weaning process.  Once life has settled down and her family issues have started to resolve, we can revisit discussion on tapering off.  I discussed the assessment and treatment plan with the patient. The patient was provided an opportunity to ask questions and all were answered. The patient agreed with the plan and demonstrated an understanding of the instructions.   The patient was advised to call back or seek an in-person evaluation if the symptoms worsen or if the  condition fails to improve as anticipated.  20 minutes of non-face-to-face time was provided during this encounter.  Return in about 3 months (around 04/24/2021) for mood follow up.  Clearnce Sorrel, DNP, APRN, FNP-BC [facility]

## 2021-01-23 ENCOUNTER — Telehealth (INDEPENDENT_AMBULATORY_CARE_PROVIDER_SITE_OTHER): Payer: BC Managed Care – PPO | Admitting: Medical-Surgical

## 2021-01-23 ENCOUNTER — Encounter: Payer: Self-pay | Admitting: Medical-Surgical

## 2021-01-23 ENCOUNTER — Other Ambulatory Visit: Payer: Self-pay | Admitting: Medical-Surgical

## 2021-01-23 DIAGNOSIS — F410 Panic disorder [episodic paroxysmal anxiety] without agoraphobia: Secondary | ICD-10-CM | POA: Diagnosis not present

## 2021-01-23 DIAGNOSIS — F418 Other specified anxiety disorders: Secondary | ICD-10-CM

## 2021-01-23 DIAGNOSIS — F411 Generalized anxiety disorder: Secondary | ICD-10-CM

## 2021-01-23 DIAGNOSIS — F430 Acute stress reaction: Secondary | ICD-10-CM | POA: Diagnosis not present

## 2021-01-23 MED ORDER — SERTRALINE HCL 50 MG PO TABS: 50.0000 mg | ORAL_TABLET | Freq: Every day | ORAL | 1 refills | Status: DC

## 2021-01-23 MED ORDER — SERTRALINE HCL 100 MG PO TABS: 100.0000 mg | ORAL_TABLET | Freq: Every day | ORAL | 1 refills | Status: DC

## 2021-01-29 ENCOUNTER — Other Ambulatory Visit: Payer: Self-pay | Admitting: Medical-Surgical

## 2021-02-01 ENCOUNTER — Encounter: Payer: Self-pay | Admitting: Medical-Surgical

## 2021-02-03 ENCOUNTER — Other Ambulatory Visit: Payer: Self-pay | Admitting: Medical-Surgical

## 2021-02-03 MED ORDER — AMPHETAMINE-DEXTROAMPHETAMINE 10 MG PO TABS: ORAL_TABLET | ORAL | 0 refills | Status: DC

## 2021-04-16 ENCOUNTER — Other Ambulatory Visit: Payer: Self-pay | Admitting: Medical-Surgical

## 2021-04-18 ENCOUNTER — Encounter: Payer: Self-pay | Admitting: Medical-Surgical

## 2021-04-21 ENCOUNTER — Encounter: Payer: Self-pay | Admitting: Medical-Surgical

## 2021-04-21 ENCOUNTER — Telehealth (INDEPENDENT_AMBULATORY_CARE_PROVIDER_SITE_OTHER): Payer: BC Managed Care – PPO | Admitting: Medical-Surgical

## 2021-04-21 DIAGNOSIS — N926 Irregular menstruation, unspecified: Secondary | ICD-10-CM | POA: Diagnosis not present

## 2021-04-21 DIAGNOSIS — F418 Other specified anxiety disorders: Secondary | ICD-10-CM | POA: Diagnosis not present

## 2021-04-21 DIAGNOSIS — N921 Excessive and frequent menstruation with irregular cycle: Secondary | ICD-10-CM

## 2021-04-21 DIAGNOSIS — F909 Attention-deficit hyperactivity disorder, unspecified type: Secondary | ICD-10-CM | POA: Diagnosis not present

## 2021-04-21 DIAGNOSIS — F411 Generalized anxiety disorder: Secondary | ICD-10-CM | POA: Diagnosis not present

## 2021-04-21 MED ORDER — AMPHETAMINE-DEXTROAMPHETAMINE 10 MG PO TABS: ORAL_TABLET | ORAL | 0 refills | Status: DC

## 2021-04-21 MED ORDER — BUSPIRONE HCL 5 MG PO TABS
5.0000 mg | ORAL_TABLET | Freq: Three times a day (TID) | ORAL | 0 refills | Status: DC | PRN
Start: 2021-04-21 — End: 2021-06-12

## 2021-04-21 MED ORDER — SERTRALINE HCL 100 MG PO TABS: 100.0000 mg | ORAL_TABLET | Freq: Every day | ORAL | 1 refills | Status: DC

## 2021-04-21 MED ORDER — SERTRALINE HCL 50 MG PO TABS: 50.0000 mg | ORAL_TABLET | Freq: Every day | ORAL | 1 refills | Status: DC

## 2021-04-21 NOTE — Progress Notes (Signed)
Virtual Visit via Video Note  I connected with Zoe Wiley on 04/21/21 at  3:40 PM EDT by a video enabled telemedicine application and verified that I am speaking with the correct person using two identifiers.   I discussed the limitations of evaluation and management by telemedicine and the availability of in person appointments. The patient expressed understanding and agreed to proceed.  Patient location: home Provider locations: office  Subjective:    CC: ADHD/mood follow up  HPI: Pleasant 35 year old female presenting today for the following:  ADHD- taking Adderall IR 20mg  every morning, 10mg  at 11am, and 10mg  every day around 3pm. Tolerating the medication well but wonders if it may be contributing to her anxiety levels. Feels that the medication is helping with focus and getting her responsibilities done. Has only tried Adderall XR, no other medications or formulations.   Mood- Taking Sertraline 150mg  daily, tolerating well without side effects. Has several Klonopin 0.5mg   tablets at home from the last refill she picked up in May. Has used them more often over the past couple of weeks due to mood changes with her menses. Took BuSpar in the past and is interested in trying that to help with her daily anxiety management.   Menstrual issues- Since the birth of her last child last year, has been experiencing irregular menstrual cycles with abnormal flow that can range from severely heavy to very light, often changing within minutes from one to the other. She is also having significant irritability and PMS type symptoms that affect her mood and make it hard to manage. Wonders if there are some hormonal issues at play. Also wonders if maybe going on birth control pills may help to reset her body.    Past medical history, Surgical history, Family history not pertinant except as noted below, Social history, Allergies, and medications have been entered into the medical record, reviewed, and  corrections made.   Review of Systems: See HPI for pertinent positives and negatives.   Objective:    General: Speaking clearly in complete sentences without any shortness of breath.  Alert and oriented x3.  Normal judgment. No apparent acute distress.  Impression and Recommendations:    1. Irregular menses 2. Menorrhagia with irregular cycle Unclear etiology. Possible relation to stress with all of the issues lately but symptoms predate the increased life stressors. Getting TVUS along with hormonal levels to rule out potential causes. If all normal, consider 3 months of COCs.  - US Pelvic Complete With Transvaginal; Future - Estradiol - Testosterone - TSH - FSH/LH - Prolactin - 17-Hydroxyprogesterone  3. Adult ADHD Continue Adderall as prescribed for now. Consider other formulations if PMS/mood symptoms not improved with recommended treatments above/below.  4. Depression with anxiety 5. Generalized anxiety disorder Continue Sertraline 150mg  daily. Start Buspar 5mg  TID PRN.  - busPIRone (BUSPAR) 5 MG tablet; Take 1 tablet (5 mg total) by mouth 3 (three) times daily as needed.  Dispense: 180 tablet; Refill: 0  I discussed the assessment and treatment plan with the patient. The patient was provided an opportunity to ask questions and all were answered. The patient agreed with the plan and demonstrated an understanding of the instructions.   The patient was advised to call back or seek an in-person evaluation if the symptoms worsen or if the condition fails to improve as anticipated.  30 minutes of non-face-to-face time was provided during this encounter.  Return in about 3 months (around 07/22/2021) for ADHD follow up.  Clearnce Sorrel,  DNP, APRN, FNP-BC [facility]

## 2021-06-03 ENCOUNTER — Other Ambulatory Visit: Payer: Self-pay | Admitting: Medical-Surgical

## 2021-06-03 ENCOUNTER — Encounter: Payer: Self-pay | Admitting: Medical-Surgical

## 2021-06-12 ENCOUNTER — Other Ambulatory Visit: Payer: Self-pay

## 2021-06-12 ENCOUNTER — Ambulatory Visit: Payer: BC Managed Care – PPO | Admitting: Medical-Surgical

## 2021-06-12 ENCOUNTER — Encounter: Payer: Self-pay | Admitting: Medical-Surgical

## 2021-06-12 VITALS — BP 136/89 | HR 69 | Resp 20 | Ht 66.0 in | Wt 160.0 lb

## 2021-06-12 DIAGNOSIS — H1032 Unspecified acute conjunctivitis, left eye: Secondary | ICD-10-CM

## 2021-06-12 MED ORDER — POLYMYXIN B-TRIMETHOPRIM 10000-0.1 UNIT/ML-% OP SOLN: 1.0000 [drp] | Freq: Four times a day (QID) | OPHTHALMIC | 0 refills | Status: AC

## 2021-06-12 NOTE — Progress Notes (Signed)
  HPI with pertinent ROS:   CC: Eye redness and swelling  HPI: Very pleasant 35 year old female presenting today with reports of 3-4days of redness, swelling, and tearing of the left eye.  Notes that on Monday she had a scratchy feeling in her eye as if there is an eyelash in it.  She also noted that when she was rubbing her eye, she felt like something "burst" in it and the drainage increased.  No purulent drainage at this point but she does have excessive tearing.  She did try flushing her eye with a syringe and then began using Visine eyedrops.  She does have 2 small children at home who are both sick and have been home all week.  No fever, chills, or vision changes.  I reviewed the past medical history, family history, social history, surgical history, and allergies today and no changes were needed.  Please see the problem list section below in epic for further details.   Physical exam:   General: Well Developed, well nourished, and in no acute distress.  Neuro: Alert and oriented x3.  HEENT: Normocephalic, atraumatic, pupils equal round reactive to light.  Fluorescein dye applied to the left eye, no corneal abrasions or foreign bodies noted.  Conjunctiva of the left eye injected with mild periorbital edema.  No purulent drainage noted. Skin: Warm and dry. Cardiac: Regular rate and rhythm.  Respiratory: Not using accessory muscles, speaking in full sentences.  Impression and Recommendations:    1. Acute conjunctivitis of left eye, unspecified acute conjunctivitis type No purulent drainage today to indicate bacterial conjunctivitis however, treating empirically with Polytrim eyedrops 1 drop to the left eye 4 times daily for 5 days.  Recommend avoiding Visine as this can worsen redness.  Can use Systane balance or refresh eyedrops which will be gentler on her eye.  Avoid excessive wiping and make sure to practice good hand hygiene.  Note provided to allow her to work from home for the next  couple of days to promote infection prevention.  Return if symptoms worsen or fail to improve. ___________________________________________ Clearnce Sorrel, DNP, APRN, FNP-BC [facility] [place]

## 2021-07-04 ENCOUNTER — Telehealth: Payer: Self-pay

## 2021-07-04 NOTE — Telephone Encounter (Signed)
Medication:amphetamine-dextroamphetamine (ADDERALL) 10 MG tablet Prior authorization determination received Medication has been approved Approval dates: 05/10/2021-06/09/2022 Case [id number]  Patient aware via: [place] aware: Yes Provider aware via this encounter

## 2021-07-04 NOTE — Telephone Encounter (Signed)
Medication: amphetamine-dextroamphetamine (ADDERALL) 10 MG tablet Prior authorization submitted via CoverMyMeds on 07/04/2021 [place] submission pending

## 2021-07-21 ENCOUNTER — Encounter: Payer: Self-pay | Admitting: Medical-Surgical

## 2021-07-21 ENCOUNTER — Other Ambulatory Visit: Payer: Self-pay | Admitting: Medical-Surgical

## 2021-07-29 ENCOUNTER — Other Ambulatory Visit: Payer: Self-pay | Admitting: Medical-Surgical

## 2021-07-29 ENCOUNTER — Telehealth: Payer: Self-pay | Admitting: Medical-Surgical

## 2021-07-29 MED ORDER — AMPHETAMINE-DEXTROAMPHETAMINE 10 MG PO TABS: ORAL_TABLET | ORAL | 0 refills | Status: DC

## 2021-07-29 NOTE — Telephone Encounter (Signed)
Patient advised of recommendations.  

## 2021-07-29 NOTE — Telephone Encounter (Signed)
Pt called.  She needs a refill on her Adderall. She has an appointment scheduled for Nov 4th.  Thank you.

## 2021-07-30 NOTE — Telephone Encounter (Signed)
Thank you :)

## 2021-07-31 MED ORDER — AMPHETAMINE-DEXTROAMPHETAMINE 20 MG PO TABS: ORAL_TABLET | ORAL | 0 refills | Status: DC

## 2021-07-31 NOTE — Telephone Encounter (Signed)
Cancelled.  

## 2021-08-15 ENCOUNTER — Ambulatory Visit (INDEPENDENT_AMBULATORY_CARE_PROVIDER_SITE_OTHER): Payer: Self-pay | Admitting: Medical-Surgical

## 2021-08-15 DIAGNOSIS — Z91199 Patient's noncompliance with other medical treatment and regimen due to unspecified reason: Secondary | ICD-10-CM

## 2021-08-15 NOTE — Progress Notes (Signed)
   Complete physical exam  Patient: Zoe Wiley   DOB: 08/01/1999   35 y.o. Female  MRN: [id number]  Subjective:    No chief complaint on file.   Zoe Wiley is a 35 y.o. female who presents today for a complete physical exam. She reports consuming a {diet types:17450} diet. {types:19826} She generally feels {DESC; WELL/FAIRLY WELL/POORLY:18703}. She reports sleeping {DESC; WELL/FAIRLY WELL/POORLY:18703}. She {does/does not:200015} have additional problems to discuss today.    Most recent fall risk assessment:    04/08/2022   10:42 AM  Fall Risk   Falls in the past year? 0  Number falls in past yr: 0  Injury with Fall? 0  Risk for fall due to : No Fall Risks  Follow up Falls evaluation completed     Most recent depression screenings:    04/08/2022   10:42 AM 02/27/2021   10:46 AM  PHQ 2/9 Scores  PHQ - 2 Score 0 0  PHQ- 9 Score 5     {VISON DENTAL STD PSA (Optional):27386}  {History (Optional):23778}  Patient Care Team: Kadarrius Yanke, NP as PCP - General (Nurse Practitioner)   Outpatient Medications Prior to Visit  Medication Sig   fluticasone (FLONASE) 50 MCG/ACT nasal spray Place 2 sprays into both nostrils in the morning and at bedtime. After 7 days, reduce to once daily.   norgestimate-ethinyl estradiol (SPRINTEC 28) 0.25-35 MG-MCG tablet Take 1 tablet by mouth daily.   Nystatin POWD Apply liberally to affected area 2 times per day   spironolactone (ALDACTONE) 100 MG tablet Take 1 tablet (100 mg total) by mouth daily.   No facility-administered medications prior to visit.    ROS        Objective:     There were no vitals taken for this visit. {Vitals History (Optional):23777}  Physical Exam   No results found for any visits on 05/14/22. {Show previous labs (optional):23779}    Assessment & Plan:    Routine Health Maintenance and Physical Exam  Immunization History  Administered Date(s) Administered   DTaP 10/15/1999, 12/11/1999,  02/19/2000, 11/04/2000, 05/20/2004   Hepatitis A 03/16/2008, 03/22/2009   Hepatitis B 08/02/1999, 09/09/1999, 02/19/2000   HiB (PRP-OMP) 10/15/1999, 12/11/1999, 02/19/2000, 11/04/2000   IPV 10/15/1999, 12/11/1999, 08/09/2000, 05/20/2004   Influenza,inj,Quad PF,6+ Mos 06/22/2014   Influenza-Unspecified 09/21/2012   MMR 08/09/2001, 05/20/2004   Meningococcal Polysaccharide 03/21/2012   Pneumococcal Conjugate-13 11/04/2000   Pneumococcal-Unspecified 02/19/2000, 05/04/2000   Tdap 03/21/2012   Varicella 08/09/2000, 03/16/2008    Health Maintenance  Topic Date Due   HIV Screening  Never done   Hepatitis C Screening  Never done   INFLUENZA VACCINE  05/12/2022   PAP-Cervical Cytology Screening  05/14/2022 (Originally 07/31/2020)   PAP SMEAR-Modifier  05/14/2022 (Originally 07/31/2020)   TETANUS/TDAP  05/14/2022 (Originally 03/21/2022)   HPV VACCINES  Discontinued   COVID-19 Vaccine  Discontinued    Discussed health benefits of physical activity, and encouraged her to engage in regular exercise appropriate for her age and condition.  Problem List Items Addressed This Visit   None Visit Diagnoses     Annual physical exam    -  Primary   Cervical cancer screening       Need for Tdap vaccination          No follow-ups on file.     Mesha Schamberger, NP   

## 2022-01-07 ENCOUNTER — Other Ambulatory Visit: Payer: Self-pay | Admitting: Neurology

## 2022-01-07 MED ORDER — SERTRALINE HCL 100 MG PO TABS: 100.0000 mg | ORAL_TABLET | Freq: Every day | ORAL | 0 refills | Status: DC

## 2022-01-07 MED ORDER — SERTRALINE HCL 50 MG PO TABS: 50.0000 mg | ORAL_TABLET | Freq: Every day | ORAL | 0 refills | Status: DC

## 2022-03-25 ENCOUNTER — Encounter: Payer: Self-pay | Admitting: Medical-Surgical

## 2022-03-25 ENCOUNTER — Ambulatory Visit: Payer: BC Managed Care – PPO | Admitting: Medical-Surgical

## 2022-03-25 VITALS — BP 135/82 | HR 60 | Resp 20 | Ht 66.0 in | Wt 183.2 lb

## 2022-03-25 DIAGNOSIS — N920 Excessive and frequent menstruation with regular cycle: Secondary | ICD-10-CM | POA: Diagnosis not present

## 2022-03-25 DIAGNOSIS — F418 Other specified anxiety disorders: Secondary | ICD-10-CM

## 2022-03-25 DIAGNOSIS — F5089 Other specified eating disorder: Secondary | ICD-10-CM

## 2022-03-25 DIAGNOSIS — F909 Attention-deficit hyperactivity disorder, unspecified type: Secondary | ICD-10-CM | POA: Diagnosis not present

## 2022-03-25 MED ORDER — SERTRALINE HCL 50 MG PO TABS: 75.0000 mg | ORAL_TABLET | Freq: Every day | ORAL | 1 refills | Status: DC

## 2022-03-25 NOTE — Patient Instructions (Signed)
Stimulant options: Vyvanse Concerta Focalin  Nonstimulant options: Strattera Intuniv Quelbree  Wellbutrin- used as an adjunct for ADHD treatment

## 2022-03-25 NOTE — Progress Notes (Signed)
Established Patient Office Visit  Subjective   Patient ID: Zoe Wiley, female   DOB: Jul 18, 1986 Age: 35 y.o. MRN: [id number]   Chief Complaint  Patient presents with   Follow-up   Medication Refill    HPI Pleasant 36 year old female presenting today for the following:  Mood: Previously taking sertraline 150 mg daily but has weaned herself down to the 100 mg daily dose.  She would like to further cut back to 75 mg and potentially 50 mg depending on how her symptoms tolerate the dose reduction.  She is currently doing counseling and is in a much better place than she was previously.  Does report mood swings surrounding her menstrual cycle but otherwise no current concerns.  Denies SI/HI.  ADHD: Previously took Adderall but notes that it made her very irritable and moody.  She was able to focus with the medication but the benefit was not worth the mood changes.  She is interested in other options but is not aware of what is out there and available to use.  Notes that she has been eating a lot of ice lately and has been having heavy menses.  At times, she is bleeding through an ultra tampon and a heavy flow pad.  Interested in options for birth control to help with menstrual management.  She does have her tubes tied so she does not need this for contraception.  Review of Systems  Constitutional:  Negative for chills, fever and malaise/fatigue.  Respiratory:  Negative for cough, shortness of breath and wheezing.   Cardiovascular:  Negative for chest pain, palpitations and leg swelling.  Genitourinary:        Heavy menses with significant premenstrual symptoms  Neurological:  Negative for dizziness and headaches.  Psychiatric/Behavioral:  Negative for depression and suicidal ideas. The patient is not nervous/anxious and does not have insomnia.        Mood swings during the week prior to her menses    Objective:    Vitals:   03/25/22 1045 03/25/22 1107  BP: 136/83 135/82  Pulse: 62  60  Resp: 20   Height: '5\' 6"'$  (1.676 m)   Weight: 183 lb 3.2 oz (83.1 kg)   SpO2: 99%   BMI (Calculated): 29.58    Physical Exam Vitals and nursing note reviewed.  Constitutional:      General: She is not in acute distress.    Appearance: Normal appearance. She is not ill-appearing.  HENT:     Head: Normocephalic and atraumatic.  Cardiovascular:     Rate and Rhythm: Normal rate and regular rhythm.     Pulses: Normal pulses.     Heart sounds: Normal heart sounds.  Pulmonary:     Effort: Pulmonary effort is normal. No respiratory distress.     Breath sounds: Normal breath sounds. No wheezing, rhonchi or rales.  Skin:    General: Skin is warm and dry.  Neurological:     Mental Status: She is alert and oriented to person, place, and time.  Psychiatric:        Mood and Affect: Mood normal.        Behavior: Behavior normal.        Thought Content: Thought content normal.        Judgment: Judgment normal.   No results found for this or any previous visit (from the past 24 hour(s)).     The ASCVD Risk score (Arnett DK, et al., 2019) failed to calculate for the following reasons:  The 2019 ASCVD risk score is only valid for ages 75 to 59   Assessment & Plan:   1. Adult ADHD List of stimulant and nonstimulant options provided to patient via AVS.  Advised her to review the list and let me know if there is one she would like to try.  She will send me a message on MyChart if she decides to try medicine and I will be glad to send that to her pharmacy.  2. Depression with anxiety Reducing sertraline to 75 mg daily.  After approximately 6 weeks, she can consider dropping down to 50 mg daily but will need to watch her symptoms for worsening.  Continue counseling. - sertraline (ZOLOFT) 50 MG tablet; Take 1.5 tablets (75 mg total) by mouth daily.  Dispense: 135 tablet; Refill: 1  3. [place] Checking labs as below. - CBC with Differential/Platelet - COMPLETE METABOLIC PANEL WITH GFR -  Fe+TIBC+Fer  4. Menorrhagia with regular cycle Checking labs.  Discussed birth control options that will help with menstrual management.  Reviewed Nexplanon, IUD, oral contraceptives, vaginal rings, and injectables.  She would like to opt for Nexplanon so advised her to return at her convenience for Nexplanon placement. - CBC with Differential/Platelet - TSH - COMPLETE METABOLIC PANEL WITH GFR - Fe+TIBC+Fer  Return for Nexplanon placement at your convenience.  ___________________________________________ Clearnce Sorrel, DNP, APRN, FNP-BC [facility] [place]

## 2022-05-11 NOTE — Progress Notes (Unsigned)
NEXPLANON INSERTION PRE-OP DIAGNOSIS: desired long-term, reversible contraception  POST-OP DIAGNOSIS: Same  PROCEDURE: Nexplanon  placement Performing Provider: Samuel Bouche, FNP  Risks and benefits reviewed with the patient. Informed consent obtained, patient opts to proceed today.    PROCEDURE:  Site (check): {RIGHT/LEFT:20294} arm  Lot #  Sterile Preparation: Chlorhexidine   Insertion site was selected 8 - 10 cm from medial epicondyle and marked along with guiding site using sterile marker  Procedure area was prepped and draped in a sterile fashion. _ mL of 1% lidocaine without epinephrine used for subcutaneous anesthesia. Anesthesia confirmed.  Nexplanon  trocar was inserted subcutaneously and then Nexplanon  capsule delivered subcutaneously Trocar was removed from the insertion site. Nexplanon  capsule was palpated by provider and patient to assure satisfactory placement. Estimated blood loss <1 mL Dressings applied: Steri-Strip and small pressure bandage Followup: The patient tolerated the procedure well without complications.  Standard post-procedure care is explained and return precautions are given.  ___________________________________________ Zoe Sorrel, DNP, APRN, FNP-BC [facility] [place]   CPT Codes [phone] Insertion, non-biodegradable drug delivery implant. 02233 Removal, non-biodegradable drug delivery implant. 61224 Removal, with reinsertion, non-biodegradable drug delivery.

## 2022-05-12 ENCOUNTER — Encounter: Payer: Self-pay | Admitting: Medical-Surgical

## 2022-05-12 ENCOUNTER — Ambulatory Visit: Payer: BC Managed Care – PPO | Admitting: Medical-Surgical

## 2022-05-12 VITALS — BP 132/82 | HR 66 | Resp 20 | Ht 66.0 in | Wt 183.4 lb

## 2022-05-12 DIAGNOSIS — Z30017 Encounter for initial prescription of implantable subdermal contraceptive: Secondary | ICD-10-CM

## 2022-05-12 LAB — POCT URINE PREGNANCY: Preg Test, Ur: NEGATIVE

## 2022-06-09 ENCOUNTER — Ambulatory Visit: Payer: BC Managed Care – PPO | Admitting: Medical-Surgical

## 2022-06-15 ENCOUNTER — Encounter: Payer: Self-pay | Admitting: Medical-Surgical

## 2022-06-16 MED ORDER — METHYLPHENIDATE HCL ER (OSM) 18 MG PO TBCR: 18.0000 mg | EXTENDED_RELEASE_TABLET | ORAL | 0 refills | Status: DC

## 2022-06-25 MED ORDER — METHYLPHENIDATE HCL ER (OSM) 18 MG PO TBCR: 18.0000 mg | EXTENDED_RELEASE_TABLET | ORAL | 0 refills | Status: DC

## 2022-06-25 NOTE — Addendum Note (Signed)
Addended by: Narda Rutherford on: 06/25/2022 11:50 AM   Modules accepted: Orders

## 2022-06-25 NOTE — Telephone Encounter (Signed)
Cancelled prescription.  

## 2022-06-25 NOTE — Addendum Note (Signed)
Addended bySamuel Bouche on: 06/25/2022 01:03 PM   Modules accepted: Orders

## 2022-06-28 ENCOUNTER — Telehealth: Payer: Self-pay | Admitting: Neurology

## 2022-06-28 NOTE — Telephone Encounter (Signed)
Prior Authorization for Methylphenidate submitted via covermymeds. This request has been approved using information available on the patient's profile. [id number]:[id number];Review Type:Prior Auth;Coverage Start Date:05/24/2022;Coverage End Date:06/23/2023;

## 2022-07-09 ENCOUNTER — Encounter: Payer: Self-pay | Admitting: Medical-Surgical

## 2022-07-09 ENCOUNTER — Ambulatory Visit: Payer: BC Managed Care – PPO | Admitting: Medical-Surgical

## 2022-07-09 VITALS — BP 132/79 | HR 62 | Resp 20 | Ht 66.0 in | Wt 177.9 lb

## 2022-07-09 DIAGNOSIS — R112 Nausea with vomiting, unspecified: Secondary | ICD-10-CM | POA: Insufficient documentation

## 2022-07-09 DIAGNOSIS — Z2821 Immunization not carried out because of patient refusal: Secondary | ICD-10-CM

## 2022-07-09 DIAGNOSIS — N912 Amenorrhea, unspecified: Secondary | ICD-10-CM | POA: Insufficient documentation

## 2022-07-09 DIAGNOSIS — Z23 Encounter for immunization: Secondary | ICD-10-CM

## 2022-07-09 DIAGNOSIS — Z975 Presence of (intrauterine) contraceptive device: Secondary | ICD-10-CM

## 2022-07-09 DIAGNOSIS — F909 Attention-deficit hyperactivity disorder, unspecified type: Secondary | ICD-10-CM | POA: Diagnosis not present

## 2022-07-09 NOTE — Progress Notes (Signed)
Established Patient Office Visit  Subjective   Patient ID: Zoe Wiley, female   DOB: 14-Sep-1986 Age: 36 y.o. MRN: [id number]   Chief Complaint  Patient presents with   Contraception   Medication Problem    HPI Pleasant 36 year old female presenting today for the following:  Nexplanon check: Has done well with her Nexplanon and her notes that the site healed appropriately.  She did have some light spotting/menstrual flow that lasted 3 to 4 weeks however this was extremely mild and not accompanied by her usual heavy flow/cramping.  Reports that she is very happy with the Nexplanon and has had no significant side effects.  ADHD: Was trying to use Concerta 18 mg daily however notes that she had significant headaches as well as irritability that usually occurred in the afternoon.  She stopped taking this because the side effects were not tolerable.  She is interested in switching over to a different medication.  She has tried Adderall in the past but the extended release caused similar issues.  She is interested in possibly trying an instant release Adderall that she can use only as needed rather than having to take it every day.   Objective:    Vitals:   07/09/22 1029  BP: 132/79  Pulse: 62  Resp: 20  Height: '5\' 6"'$  (1.676 m)  Weight: 177 lb 14.4 oz (80.7 kg)  SpO2: 100%  BMI (Calculated): 28.73    Physical Exam Vitals and nursing note reviewed.  Constitutional:      General: She is not in acute distress.    Appearance: Normal appearance. She is not ill-appearing.  HENT:     Head: Normocephalic and atraumatic.  Cardiovascular:     Rate and Rhythm: Normal rate and regular rhythm.     Pulses: Normal pulses.     Heart sounds: Normal heart sounds.  Pulmonary:     Effort: Pulmonary effort is normal. No respiratory distress.     Breath sounds: Normal breath sounds. No wheezing, rhonchi or rales.  Skin:    General: Skin is warm and dry.  Neurological:     Mental Status: She  is alert and oriented to person, place, and time.  Psychiatric:        Mood and Affect: Mood normal.        Behavior: Behavior normal.        Thought Content: Thought content normal.        Judgment: Judgment normal.   No results found for this or any previous visit (from the past 24 hour(s)).     The ASCVD Risk score (Arnett DK, et al., 2019) failed to calculate for the following reasons:   The 2019 ASCVD risk score is only valid for ages 6 to 7   Assessment & Plan:   1. Nexplanon in place Has done very well with her Nexplanon placement and is happy with this form of birth control.  Continue to check the site regularly for placement confirmation.  Due to be replaced in 3 years.  2. Adult ADHD Discussed various options.  She would like to trial Adderall IR so sending in 10 mg twice daily as needed.  Monitor for side effects or intolerances.  I will reach out to her via MyChart in 3-4 weeks to see how she is doing on this medication.  3. Influenza vaccination declined Patient aware of risks versus benefits of vaccination.  Declined today.  Return in about 3 months (around 10/08/2022) for ADHD follow up.  ___________________________________________ Clearnce Sorrel, DNP, APRN, FNP-BC [facility] [place]

## 2022-07-13 MED ORDER — AMPHETAMINE-DEXTROAMPHETAMINE 10 MG PO TABS: 10.0000 mg | ORAL_TABLET | Freq: Two times a day (BID) | ORAL | 0 refills | Status: DC

## 2022-07-13 NOTE — Addendum Note (Signed)
Addended bySamuel Bouche on: 07/13/2022 10:17 PM   Modules accepted: Orders

## 2022-07-14 ENCOUNTER — Telehealth: Payer: Self-pay

## 2022-07-14 NOTE — Telephone Encounter (Signed)
Initiated Prior authorization FQM:[id number] '10MG'$  tablets Via: Covermymeds Case/Key:[id number] Status: approved  as of 07/14/22 Reason:Coverage Start Date:06/14/2022;Coverage End Date:07/14/2023; Notified Pt via: Mychart

## 2022-07-30 ENCOUNTER — Encounter: Payer: Self-pay | Admitting: Medical-Surgical

## 2022-09-21 ENCOUNTER — Other Ambulatory Visit: Payer: Self-pay | Admitting: Medical-Surgical

## 2022-09-21 DIAGNOSIS — F418 Other specified anxiety disorders: Secondary | ICD-10-CM

## 2022-09-22 NOTE — Telephone Encounter (Signed)
Scheduled for 10/23/22 @ 1:40 tvt

## 2022-10-18 ENCOUNTER — Other Ambulatory Visit: Payer: Self-pay | Admitting: Medical-Surgical

## 2022-10-18 DIAGNOSIS — F418 Other specified anxiety disorders: Secondary | ICD-10-CM

## 2022-10-23 ENCOUNTER — Ambulatory Visit (INDEPENDENT_AMBULATORY_CARE_PROVIDER_SITE_OTHER): Payer: 59 | Admitting: Medical-Surgical

## 2022-10-23 ENCOUNTER — Encounter: Payer: Self-pay | Admitting: Medical-Surgical

## 2022-10-23 VITALS — BP 130/85 | HR 77 | Resp 20 | Ht 66.0 in | Wt 181.8 lb

## 2022-10-23 DIAGNOSIS — F418 Other specified anxiety disorders: Secondary | ICD-10-CM

## 2022-10-23 DIAGNOSIS — Z7689 Persons encountering health services in other specified circumstances: Secondary | ICD-10-CM | POA: Diagnosis not present

## 2022-10-23 DIAGNOSIS — F909 Attention-deficit hyperactivity disorder, unspecified type: Secondary | ICD-10-CM | POA: Diagnosis not present

## 2022-10-23 MED ORDER — SERTRALINE HCL 50 MG PO TABS: ORAL_TABLET | ORAL | 1 refills | Status: DC

## 2022-10-23 MED ORDER — AMPHETAMINE-DEXTROAMPHETAMINE 10 MG PO TABS: 10.0000 mg | ORAL_TABLET | Freq: Two times a day (BID) | ORAL | 0 refills | Status: DC

## 2022-10-23 NOTE — Progress Notes (Signed)
Established Patient Office Visit  Subjective   Patient ID: Zoe Wiley, female   DOB: 1986-04-30 Age: 37 y.o. MRN: [id number]   Chief Complaint  Patient presents with   ADHD   Follow-up   MOOD   HPI Pleasant 37 year old female presenting today for the following:  ADHD: Using Adderall 10 mg instant release tablets twice daily as needed.  There are some days where she takes the medication twice daily but others that she skips all doses.  Has noted an increase in her anxiety when she uses the Adderall to me days in a row so tends to hold off.  Feels that the medication is still working well for her and has no side effects.  Mood: Taking sertraline 100 mg daily but is hoping to be able to wean off of this.  She is currently taking 2 of the 50 mg tablets and likes to have the option to take less if she feels like it is appropriate.  Feels that the medication is still working well for her and has no side effects or intolerances.  Distal endorses some breakthrough anxiety but feels this is more related to the Adderall and certain situations.  Denies SI/HI.  She is concerned about her weight today.  She used to be underweight however over the last few years, her weight has continued to creep up.  She has been working on eating healthy.  Has been doing several types of exercise including beach body on demand and yoga.  Notes that her daily intake consists of: Breakfast: Avocado and cottage cheese Lunch: Salad with dressing on the side Dinner: Salad with grilled chicken and dressing on the side.   Objective:    Vitals:   10/23/22 1342  BP: 130/85  Pulse: 77  Resp: 20  Height: '5\' 6"'$  (1.676 m)  Weight: 181 lb 12.8 oz (82.5 kg)  SpO2: 99%  BMI (Calculated): 29.36    Physical Exam Vitals and nursing note reviewed.  Constitutional:      General: She is not in acute distress.    Appearance: Normal appearance. She is not ill-appearing.  HENT:     Head: Normocephalic and atraumatic.   Cardiovascular:     Rate and Rhythm: Normal rate and regular rhythm.     Pulses: Normal pulses.     Heart sounds: Normal heart sounds.  Pulmonary:     Effort: Pulmonary effort is normal. No respiratory distress.     Breath sounds: Normal breath sounds. No wheezing, rhonchi or rales.  Skin:    General: Skin is warm and dry.  Neurological:     Mental Status: She is alert and oriented to person, place, and time.  Psychiatric:        Mood and Affect: Mood normal.        Behavior: Behavior normal.        Thought Content: Thought content normal.        Judgment: Judgment normal.   No results found for this or any previous visit (from the past 24 hour(s)).     The ASCVD Risk score (Arnett DK, et al., 2019) failed to calculate for the following reasons:   The 2019 ASCVD risk score is only valid for ages 65 to 104   Assessment & Plan:   1. Depression with anxiety Symptoms stable.  Continue Zoloft 100 mg daily, titrating down depending on symptoms and need. - sertraline (ZOLOFT) 50 MG tablet; TAKE 1-2 TABLETS DAILY BY MOUTH  Dispense: 180 tablet;  Refill: 1  2. Adult ADHD Continue Adderall 10 mg twice daily as needed.  3. Encounter for weight management Discussed recommendations for dietary modification, caloric intake, macro breakdown, and regular intentional exercise.  Reviewed options available to help with medical management of weight loss.  She will contact her insurance and let me know if any antiobesity medications are covered and if she would like to give anything a try.   Return in about 3 months (around 01/22/2023) for ADHD follow up.  ___________________________________________ Clearnce Sorrel, DNP, APRN, FNP-BC [facility] and [place]

## 2022-11-14 ENCOUNTER — Other Ambulatory Visit: Payer: Self-pay | Admitting: Medical-Surgical

## 2022-11-14 DIAGNOSIS — F418 Other specified anxiety disorders: Secondary | ICD-10-CM

## 2022-11-16 ENCOUNTER — Encounter: Payer: Self-pay | Admitting: Medical-Surgical

## 2022-11-16 ENCOUNTER — Other Ambulatory Visit (HOSPITAL_COMMUNITY)
Admission: RE | Admit: 2022-11-16 | Discharge: 2022-11-16 | Disposition: A | Discharge status: Home / Self Care | Payer: 59 | Source: Ambulatory Visit | Attending: Medical-Surgical | Admitting: Medical-Surgical

## 2022-11-16 ENCOUNTER — Ambulatory Visit (INDEPENDENT_AMBULATORY_CARE_PROVIDER_SITE_OTHER): Payer: 59 | Admitting: Medical-Surgical

## 2022-11-16 VITALS — BP 136/84 | HR 69 | Resp 20 | Ht 66.0 in | Wt 181.6 lb

## 2022-11-16 DIAGNOSIS — Z13220 Encounter for screening for lipoid disorders: Secondary | ICD-10-CM | POA: Diagnosis not present

## 2022-11-16 DIAGNOSIS — Z124 Encounter for screening for malignant neoplasm of cervix: Secondary | ICD-10-CM | POA: Insufficient documentation

## 2022-11-16 DIAGNOSIS — Z8639 Personal history of other endocrine, nutritional and metabolic disease: Secondary | ICD-10-CM | POA: Diagnosis not present

## 2022-11-16 DIAGNOSIS — Z0000 Encounter for general adult medical examination without abnormal findings: Secondary | ICD-10-CM

## 2022-11-16 DIAGNOSIS — N926 Irregular menstruation, unspecified: Secondary | ICD-10-CM

## 2022-11-16 NOTE — Progress Notes (Signed)
Complete physical exam  Patient: Zoe Wiley   DOB: 07/16/1986   37 y.o. Female  MRN: [id number]  Subjective:    Chief Complaint  Patient presents with   Annual Exam   Gynecologic Exam    Zoe Wiley is a 37 y.o. female who presents today for a complete physical exam. She reports consuming a general diet. The patient does not participate in regular exercise at present. She generally feels fairly well. She reports sleeping well. She does not have additional problems to discuss today.    Most recent fall risk assessment:    11/16/2022    2:02 PM  [place] in the past year? 0  Number falls in past yr: 0  Injury with Fall? 0  Risk for fall due to : No Fall Risks  Follow up Falls evaluation completed     Most recent depression screenings:    11/16/2022    2:03 PM 10/23/2022    3:02 PM  PHQ 2/9 Scores  PHQ - 2 Score 0 0  PHQ- 9 Score  4    Vision:Not within last year  and Dental: No current dental problems and Receives regular dental care    Patient Care Team: Samuel Bouche, NP as PCP - General (Nurse Practitioner)   Outpatient Medications Prior to Visit  Medication Sig   amphetamine-dextroamphetamine (ADDERALL) 10 MG tablet Take 1 tablet (10 mg total) by mouth 2 (two) times daily.   [DISCONTINUED] sertraline (ZOLOFT) 50 MG tablet TAKE 1-2 TABLETS DAILY BY MOUTH   No facility-administered medications prior to visit.    Review of Systems  Constitutional:  Negative for chills, fever, malaise/fatigue and weight loss.  HENT:  Negative for congestion, ear pain, hearing loss, sinus pain and sore throat.   Eyes:  Negative for blurred vision, photophobia and pain.  Respiratory:  Negative for cough, shortness of breath and wheezing.   Cardiovascular:  Negative for chest pain, palpitations and leg swelling.  Gastrointestinal:  Negative for abdominal pain, constipation, diarrhea, heartburn, nausea and vomiting.  Genitourinary:  Negative for dysuria, frequency and  urgency.  Musculoskeletal:  Negative for falls and neck pain.  Skin:  Negative for itching and rash.  Neurological:  Negative for dizziness, weakness and headaches.  Endo/Heme/Allergies:  Negative for polydipsia. Does not bruise/bleed easily.  Psychiatric/Behavioral:  Negative for depression, substance abuse and suicidal ideas. The patient is not nervous/anxious and does not have insomnia.       Objective:     BP 136/84 (BP Location: Right Arm, Cuff Size: Normal)   Pulse 69   Resp 20   Ht '5\' 6"'$  (1.676 m)   Wt 181 lb 9.6 oz (82.4 kg)   SpO2 100%   BMI 29.31 kg/m    Physical Exam Exam conducted with a chaperone present.  Constitutional:      General: She is not in acute distress.    Appearance: Normal appearance. She is not ill-appearing.  HENT:     Head: Normocephalic and atraumatic.     Right Ear: Tympanic membrane, ear canal and external ear normal. There is no impacted cerumen.     Left Ear: Tympanic membrane, ear canal and external ear normal. There is no impacted cerumen.     Nose: Nose normal. No congestion or rhinorrhea.     Mouth/Throat:     Mouth: Mucous membranes are moist.     Pharynx: No oropharyngeal exudate or posterior oropharyngeal erythema.  Eyes:     General:  No scleral icterus.       Right eye: No discharge.        Left eye: No discharge.     Extraocular Movements: Extraocular movements intact.     Conjunctiva/sclera: Conjunctivae normal.     Pupils: Pupils are equal, round, and reactive to light.  Neck:     Thyroid: No thyromegaly.     Vascular: No carotid bruit or JVD.     Trachea: Trachea normal.  Cardiovascular:     Rate and Rhythm: Normal rate and regular rhythm.     Pulses: Normal pulses.     Heart sounds: Normal heart sounds. No murmur heard.    No friction rub. No gallop.  Pulmonary:     Effort: Pulmonary effort is normal. No respiratory distress.     Breath sounds: Normal breath sounds. No wheezing.  Abdominal:     General: Bowel sounds  are normal. There is no distension.     Palpations: Abdomen is soft.     Tenderness: There is no abdominal tenderness. There is no guarding.  Genitourinary:    Exam position: Lithotomy position.     Labia:        Right: No rash, tenderness, lesion or injury.        Left: No rash, tenderness, lesion or injury.      Vagina: Normal.     Cervix: Normal.     Uterus: Normal.      Adnexa: Right adnexa normal and left adnexa normal.  Musculoskeletal:        General: Normal range of motion.     Cervical back: Normal range of motion and neck supple.  Lymphadenopathy:     Cervical: No cervical adenopathy.  Skin:    General: Skin is warm and dry.  Neurological:     Mental Status: She is alert and oriented to person, place, and time.     Cranial Nerves: No cranial nerve deficit.  Psychiatric:        Mood and Affect: Mood normal.        Behavior: Behavior normal.        Thought Content: Thought content normal.        Judgment: Judgment normal.    No results found for any visits on 11/16/22.     Assessment & Plan:    Routine Health Maintenance and Physical Exam  Immunization History  Administered Date(s) Administered   Influenza, Seasonal, Injecte, Preservative Fre 08/21/2015   Influenza,inj,Quad PF,6+ Mos 10/17/2019   Tdap 08/24/2016, 10/17/2019    Health Maintenance  Topic Date Due   PAP SMEAR-Modifier  07/17/2022   INFLUENZA VACCINE  01/10/2023 (Originally 05/12/2022)   DTaP/Tdap/Td (3 - Td or Tdap) 10/16/2029   HIV Screening  Completed   HPV VACCINES  Aged Out   COVID-19 Vaccine  Discontinued   Hepatitis C Screening  Discontinued    Discussed health benefits of physical activity, and encouraged her to engage in regular exercise appropriate for her age and condition.  1. Annual physical exam Checking labs as below.  Up-to-date on preventative care.  Wellness information provided with AVS. - CBC with Differential/Platelet - COMPLETE METABOLIC PANEL WITH GFR - Lipid  panel  2. Lipid screening Checking lipid panel today. - COMPLETE METABOLIC PANEL WITH GFR - Lipid panel  3. Cervical cancer screening Pap smear with HPV cotesting completed today. - Cytology - PAP  4. History of iron deficiency Checking iron panel. - Iron, TIBC and Ferritin Panel  5. Irregular menses Checking  labs as below. - TSH - Testosterone  Return in about 3 months (around 02/14/2023) for ADHD follow up.   Samuel Bouche, NP

## 2022-11-19 LAB — LIPID PANEL
Cholesterol: 158 mg/dL (ref <200)
HDL: 42 mg/dL — ABNORMAL LOW (ref >=50)
LDL Cholesterol (Calc): 85 mg/dL (calc)
Non-HDL Cholesterol (Calc): 116 mg/dL (calc) (ref <130)
Total CHOL/HDL Ratio: 3.8 (calc) (ref <5.0)
Triglycerides: 221 mg/dL — ABNORMAL HIGH (ref <150)

## 2022-11-19 LAB — CBC WITH DIFFERENTIAL/PLATELET
Absolute Monocytes: 334 cells/uL (ref 200–950)
Basophils Absolute: 88 cells/uL (ref 0–200)
Basophils Relative: 1.4 %
Eosinophils Absolute: 132 cells/uL (ref 15–500)
Eosinophils Relative: 2.1 %
HCT: 39.6 % (ref 35.0–45.0)
Hemoglobin: 13.8 g/dL (ref 11.7–15.5)
Lymphs Abs: 2073 cells/uL (ref 850–3900)
MCH: 31.4 pg (ref 27.0–33.0)
MCHC: 34.8 g/dL (ref 32.0–36.0)
MCV: 90 fL (ref 80.0–100.0)
MPV: 11.5 fL (ref 7.5–12.5)
Monocytes Relative: 5.3 %
Neutro Abs: 3673 cells/uL (ref 1500–7800)
Neutrophils Relative %: 58.3 %
Platelets: 230 10*3/uL (ref 140–400)
RBC: 4.4 10*6/uL (ref 3.80–5.10)
RDW: 12.2 % (ref 11.0–15.0)
Total Lymphocyte: 32.9 %
WBC: 6.3 10*3/uL (ref 3.8–10.8)

## 2022-11-19 LAB — COMPLETE METABOLIC PANEL WITH GFR
AG Ratio: 1.6 (calc) (ref 1.0–2.5)
ALT: 11 U/L (ref 6–29)
AST: 17 U/L (ref 10–30)
Albumin: 4.4 g/dL (ref 3.6–5.1)
Alkaline phosphatase (APISO): 54 U/L (ref 31–125)
BUN: 13 mg/dL (ref 7–25)
CO2: 25 mmol/L (ref 20–32)
Calcium: 9.8 mg/dL (ref 8.6–10.2)
Chloride: 104 mmol/L (ref 98–110)
Creat: 0.59 mg/dL (ref 0.50–0.97)
Globulin: 2.7 g/dL (calc) (ref 1.9–3.7)
Glucose, Bld: 84 mg/dL (ref 65–99)
Potassium: 4.5 mmol/L (ref 3.5–5.3)
Sodium: 139 mmol/L (ref 135–146)
Total Bilirubin: 0.3 mg/dL (ref 0.2–1.2)
Total Protein: 7.1 g/dL (ref 6.1–8.1)
eGFR: 120 mL/min/{1.73_m2} (ref >=60)

## 2022-11-19 LAB — TESTOSTERONE, TOTAL, LC/MS/MS: Testosterone, Total, LC-MS-MS: 14 ng/dL (ref 2–45)

## 2022-11-19 LAB — CYTOLOGY - PAP
Adequacy: ABSENT
Comment: NEGATIVE
Diagnosis: UNDETERMINED — AB
High risk HPV: NEGATIVE

## 2022-11-19 LAB — IRON,TIBC AND FERRITIN PANEL
%SAT: 51 % (calc) — ABNORMAL HIGH (ref 16–45)
Ferritin: 14 ng/mL — ABNORMAL LOW (ref 16–154)
Iron: 184 ug/dL (ref 40–190)
TIBC: 363 mcg/dL (calc) (ref 250–450)

## 2022-11-19 LAB — TSH: TSH: 1.31 mIU/L

## 2023-01-04 ENCOUNTER — Other Ambulatory Visit: Payer: Self-pay | Admitting: Medical-Surgical

## 2023-01-05 MED ORDER — AMPHETAMINE-DEXTROAMPHETAMINE 10 MG PO TABS: 10.0000 mg | ORAL_TABLET | Freq: Two times a day (BID) | ORAL | 0 refills | Status: DC

## 2023-01-22 ENCOUNTER — Ambulatory Visit: Payer: 59 | Admitting: Medical-Surgical

## 2023-01-22 ENCOUNTER — Encounter: Payer: Self-pay | Admitting: Medical-Surgical

## 2023-01-22 VITALS — BP 151/92 | HR 67 | Resp 20 | Ht 66.0 in | Wt 176.8 lb

## 2023-01-22 DIAGNOSIS — F411 Generalized anxiety disorder: Secondary | ICD-10-CM

## 2023-01-22 DIAGNOSIS — Z630 Problems in relationship with spouse or partner: Secondary | ICD-10-CM

## 2023-01-22 DIAGNOSIS — I10 Essential (primary) hypertension: Secondary | ICD-10-CM

## 2023-01-22 DIAGNOSIS — F909 Attention-deficit hyperactivity disorder, unspecified type: Secondary | ICD-10-CM | POA: Diagnosis not present

## 2023-01-22 DIAGNOSIS — F418 Other specified anxiety disorders: Secondary | ICD-10-CM

## 2023-01-22 MED ORDER — SERTRALINE HCL 50 MG PO TABS: ORAL_TABLET | ORAL | 3 refills | Status: DC

## 2023-01-22 MED ORDER — AMPHETAMINE-DEXTROAMPHETAMINE 10 MG PO TABS: 10.0000 mg | ORAL_TABLET | Freq: Two times a day (BID) | ORAL | 0 refills | Status: DC

## 2023-01-22 MED ORDER — PROPRANOLOL HCL 10 MG PO TABS: 10.0000 mg | ORAL_TABLET | Freq: Three times a day (TID) | ORAL | 1 refills | Status: DC | PRN

## 2023-01-22 MED ORDER — PROPRANOLOL HCL 10 MG PO TABS: 10.0000 mg | ORAL_TABLET | Freq: Three times a day (TID) | ORAL | 1 refills | Status: DC

## 2023-01-22 NOTE — Progress Notes (Signed)
        Established patient visit  History, exam, impression, and plan:  1. Adult ADHD Using Adderall instant release 10 mg twice daily as needed.  Most often takes 1 dose per day however if she is having a busy day or is at home with a lot of things to manage at the same time, she will take the second dose.  Tolerating the medication well without side effects.  No changes in appetite, weight, or sleep pattern.  Continue Adderall 10 mg twice daily as needed.  Refill sent.  2. Hypertension goal BP (blood pressure) < 130/80 Not currently treated with medications.  Has a cuff at home but is not checking her blood pressure.  Has been under extraordinary stress lately and feels that this has negatively affected her blood pressure.  Tries to limit dietary sodium.  Was previously staying active and had incorporated regular intentional exercise but with the increase in stress and depressive symptoms, she has not been doing these things.  After discussion of considerable anxiety and situational issues that can be exacerbating, plan to start propranolol 10 mg 3 times daily as needed and return in 2 weeks for nurse visit for blood pressure check.  Monitor blood pressure and heart rate at home.  3. Generalized anxiety disorder 4. Depression with anxiety 5. Stress due to marital problems History of depression with anxiety that has been treated successfully with sertraline 75 mg daily.  Tolerating the medication well without side effects.  Unfortunately, is having an increase in her symptoms and difficulty coping due to a recurrence of marital problems.  She and her husband are discussing divorce and she is significantly worried about the effect on her children and being able to manage them in the house as a single parent.  Not currently doing counseling but did do marriage counseling with him.  Is seeking a new counselor at this point.  Referring to behavioral health to get her in touch with [facility].  Adding  propranolol as noted above to help with stress/anxiety. - sertraline (ZOLOFT) 50 MG tablet; TAKE 1 AND 1/2 TABLETS BY MOUTH DAILY  Dispense: 45 tablet; Refill: 3 - Ambulatory referral to Behavioral Health  Procedures performed this visit: None.  Return in about 2 weeks (around 02/05/2023) for nurse visit for BP check.  __________________________________ Thayer Ohm, DNP, APRN, FNP-BC Primary Care and Sports Medicine [facility] [place]

## 2023-01-26 ENCOUNTER — Encounter: Payer: Self-pay | Admitting: Medical-Surgical

## 2023-01-27 MED ORDER — ZOLPIDEM TARTRATE 5 MG PO TABS: 5.0000 mg | ORAL_TABLET | Freq: Every evening | ORAL | 1 refills | Status: DC | PRN

## 2023-02-05 ENCOUNTER — Ambulatory Visit (INDEPENDENT_AMBULATORY_CARE_PROVIDER_SITE_OTHER): Payer: 59 | Admitting: Medical-Surgical

## 2023-02-05 DIAGNOSIS — I10 Essential (primary) hypertension: Secondary | ICD-10-CM | POA: Diagnosis not present

## 2023-02-05 NOTE — Progress Notes (Signed)
   Subjective:    Patient ID: Zoe Wiley, female    DOB: 18-Dec-1985, 36 y.o.   MRN: [id number]  HPI Patient is here for blood pressure check. She denies Chest pain, lower extremity leg edema, visual changes or headaches. Pt states that she was going through some personal situations when she was here last.    Review of Systems     Objective:   Physical Exam        Assessment & Plan:  Blood pressure in office was 135/77 HR 62. She states that she has not taken Propanolol because she does not like the way it makes her feel. I advised her not to take it and wait to here back from [place]. Pt advised to follow up as needed.

## 2023-02-05 NOTE — Progress Notes (Signed)
Agree with documentation as below.  ___________________________________________ Icel Castles L. Bradford Cazier, DNP, APRN, FNP-BC Primary Care and Sports Medicine [facility]  

## 2023-04-20 ENCOUNTER — Other Ambulatory Visit: Payer: Self-pay | Admitting: Medical-Surgical

## 2023-04-20 DIAGNOSIS — F418 Other specified anxiety disorders: Secondary | ICD-10-CM

## 2023-04-20 NOTE — Telephone Encounter (Signed)
Upcoming appointment 04/23/2023

## 2023-04-23 ENCOUNTER — Encounter: Payer: Self-pay | Admitting: Medical-Surgical

## 2023-04-23 ENCOUNTER — Encounter: Payer: 59 | Admitting: Medical-Surgical

## 2023-04-23 NOTE — Progress Notes (Unsigned)
        Established patient visit  History, exam, impression, and plan:  Primary female hypogonadism Up-to-date testosterone labs.  Due for his next testosterone injection.  Doing well on his current regimen with stable vitals.  Testosterone cypionate 100 mg IM given x 1 in the office.  Due for next shot in 2 weeks.  Lump of skin of lower extremity, left Noted a lump on the left posterior calf approximately 1 month ago.  Initially, had redness, tenderness, swelling at the area however this has fully resolved.  No longer has any residual symptoms but the lump has remained.  His wife noticed it and urged him to be seen to evaluate it.  History notable for varicose veins, compliant with recommendations for compression socks daily.  No recent injury or trauma to the area.  Unclear etiology.  The lump is approximately 1 cm x 1.5 cm and slightly discolored.  See clinical photo.  Plan to get vascular ultrasound as it does seem to be connected to one of the varicose veins that runs through the area.  Suspect superficial thrombophlebitis initially.  Discussed conservative measures with Tylenol, heat, ice, and compression socks.    Procedures performed this visit: None.  Return in about 2 weeks (around 01/20/2023) for nurse visit for testosterone shot.  __________________________________ Darria Corvera L. Arietta Eisenstein, DNP, APRN, FNP-BC Primary Care and Sports Medicine [facility]  

## 2023-04-23 NOTE — Telephone Encounter (Signed)
Attempted call to patient to assist her in rescheduling but had to leave a voice mail message . Recommended she contact the front desk to assist her in scheduling and this number was left on voice mail.Marland Kitchen

## 2023-04-23 NOTE — Telephone Encounter (Signed)
Yes. Please reschedule her.

## 2023-05-13 ENCOUNTER — Ambulatory Visit: Payer: 59 | Admitting: Professional

## 2023-06-06 ENCOUNTER — Other Ambulatory Visit: Payer: Self-pay | Admitting: Medical-Surgical

## 2023-06-07 MED ORDER — AMPHETAMINE-DEXTROAMPHETAMINE 10 MG PO TABS: 10.0000 mg | ORAL_TABLET | Freq: Two times a day (BID) | ORAL | 0 refills | Status: DC

## 2023-06-07 NOTE — Telephone Encounter (Signed)
Patient due for follow-up.  30 tablet refill sent to facilitate scheduling a completion of the appointment.

## 2023-06-28 ENCOUNTER — Encounter: Payer: Self-pay | Admitting: Medical-Surgical

## 2023-06-28 ENCOUNTER — Ambulatory Visit: Payer: 59 | Admitting: Medical-Surgical

## 2023-06-28 VITALS — BP 157/91 | HR 70 | Resp 20 | Ht 66.0 in | Wt 176.9 lb

## 2023-06-28 DIAGNOSIS — I10 Essential (primary) hypertension: Secondary | ICD-10-CM

## 2023-06-28 DIAGNOSIS — F909 Attention-deficit hyperactivity disorder, unspecified type: Secondary | ICD-10-CM

## 2023-06-28 MED ORDER — AMPHETAMINE-DEXTROAMPHETAMINE 10 MG PO TABS
10.0000 mg | ORAL_TABLET | Freq: Two times a day (BID) | ORAL | 0 refills | Status: DC
Start: 2023-08-28 — End: 2023-09-17

## 2023-06-28 MED ORDER — AMPHETAMINE-DEXTROAMPHETAMINE 10 MG PO TABS
10.0000 mg | ORAL_TABLET | Freq: Two times a day (BID) | ORAL | 0 refills | Status: DC
Start: 2023-06-28 — End: 2023-09-17

## 2023-06-28 MED ORDER — AMPHETAMINE-DEXTROAMPHETAMINE 10 MG PO TABS
10.0000 mg | ORAL_TABLET | Freq: Two times a day (BID) | ORAL | 0 refills | Status: DC
Start: 2023-07-28 — End: 2023-09-17

## 2023-06-28 NOTE — Progress Notes (Signed)
        Established patient visit  History, exam, impression, and plan:  1. Adult ADHD Pleasant 37 year old female presenting today for follow-up on ADHD.  She has been using Adderall 10 mg instant release twice daily as needed.  Over the summer, she weaned herself down and ends up tapering off the medication but notes that her symptoms were difficult to manage and quickly restarted it.  She is averaging 10 mg dose once daily Monday through Friday and usually takes this after lunch which helps get through the rest of the day.  On the weekends, she takes 1.5-2 tablets depending on the day and the activities.  Denies any concerning symptoms and has been on this long-term.  No difficulty with sleeping, palpitations, weight fluctuations, or appetite suppression.  Symptoms stable.  Continue Adderall 10 mg twice daily as needed. - amphetamine-dextroamphetamine (ADDERALL) 10 MG tablet; Take 1 tablet (10 mg total) by mouth 2 (two) times daily.  Dispense: 60 tablet; Refill: 0 - amphetamine-dextroamphetamine (ADDERALL) 10 MG tablet; Take 1 tablet (10 mg total) by mouth 2 (two) times daily.  Dispense: 60 tablet; Refill: 0 - amphetamine-dextroamphetamine (ADDERALL) 10 MG tablet; Take 1 tablet (10 mg total) by mouth 2 (two) times daily.  Dispense: 60 tablet; Refill: 0  2. Hypertension goal BP (blood pressure) < 130/80 Has not been monitoring blood pressure at home.  Is prescribed propranolol but only uses this as needed for anxiety/panic attacks.  Notes she averages taking a propranolol dose once a week or so.  Blood pressure elevated on arrival at 157/91.  On recheck, blood pressure remains elevated.  Recommend monitoring at home with a goal of 130/80 or less.  If consistently higher, we will need to make medication changes.  MyChart message sent to patient regarding options for management of elevated blood pressure.  Procedures performed this visit: None.  Return in about 6 months (around 12/26/2023) for ADHD  follow up.  __________________________________ Thayer Ohm, DNP, APRN, FNP-BC Primary Care and Sports Medicine [facility] [place]

## 2023-07-12 ENCOUNTER — Telehealth: Payer: Self-pay

## 2023-07-12 NOTE — Telephone Encounter (Signed)
Initiated Prior authorization ZOX:[id number] 10MG  tablets Via: Covermymeds Case/Key:[id number] Status: approved as of 07/12/23 Reason:Coverage Start Date:06/12/2023;Coverage End Date:07/11/2024 Notified Pt via: Mychart

## 2023-07-14 ENCOUNTER — Other Ambulatory Visit: Payer: Self-pay | Admitting: Medical-Surgical

## 2023-07-14 DIAGNOSIS — F418 Other specified anxiety disorders: Secondary | ICD-10-CM

## 2023-09-17 ENCOUNTER — Encounter: Payer: Self-pay | Admitting: Medical-Surgical

## 2023-09-17 ENCOUNTER — Ambulatory Visit (HOSPITAL_BASED_OUTPATIENT_CLINIC_OR_DEPARTMENT_OTHER): Payer: 59 | Admitting: Family Medicine

## 2023-09-17 ENCOUNTER — Ambulatory Visit: Payer: 59 | Admitting: Medical-Surgical

## 2023-09-17 VITALS — BP 169/106 | HR 67 | Resp 20 | Ht 66.0 in | Wt 179.1 lb

## 2023-09-17 DIAGNOSIS — F418 Other specified anxiety disorders: Secondary | ICD-10-CM | POA: Diagnosis not present

## 2023-09-17 DIAGNOSIS — Z113 Encounter for screening for infections with a predominantly sexual mode of transmission: Secondary | ICD-10-CM

## 2023-09-17 DIAGNOSIS — R82998 Other abnormal findings in urine: Secondary | ICD-10-CM

## 2023-09-17 DIAGNOSIS — R8290 Unspecified abnormal findings in urine: Secondary | ICD-10-CM

## 2023-09-17 DIAGNOSIS — F909 Attention-deficit hyperactivity disorder, unspecified type: Secondary | ICD-10-CM

## 2023-09-17 DIAGNOSIS — I10 Essential (primary) hypertension: Secondary | ICD-10-CM

## 2023-09-17 LAB — POCT URINALYSIS DIP (CLINITEK)
Bilirubin, UA: NEGATIVE
Glucose, UA: NEGATIVE mg/dL
Ketones, POC UA: NEGATIVE mg/dL
Nitrite, UA: NEGATIVE
Spec Grav, UA: 1.02 (ref 1.010–1.025)
Urobilinogen, UA: 1 U/dL
pH, UA: 7.5 (ref 5.0–8.0)

## 2023-09-17 MED ORDER — AMPHETAMINE-DEXTROAMPHETAMINE 10 MG PO TABS: 10.0000 mg | ORAL_TABLET | Freq: Two times a day (BID) | ORAL | 0 refills | Status: DC

## 2023-09-17 MED ORDER — HYDROCHLOROTHIAZIDE 12.5 MG PO TABS: 12.5000 mg | ORAL_TABLET | Freq: Every day | ORAL | 3 refills | Status: DC

## 2023-09-17 MED ORDER — BUPROPION HCL ER (XL) 150 MG PO TB24: 150.0000 mg | ORAL_TABLET | Freq: Every day | ORAL | 1 refills | Status: DC

## 2023-09-17 MED ORDER — AMPHETAMINE-DEXTROAMPHETAMINE 10 MG PO TABS
10.0000 mg | ORAL_TABLET | Freq: Two times a day (BID) | ORAL | 0 refills | Status: DC
Start: 2023-09-17 — End: 2023-10-15

## 2023-09-17 MED ORDER — SERTRALINE HCL 50 MG PO TABS: ORAL_TABLET | ORAL | 1 refills | Status: DC

## 2023-09-17 NOTE — Progress Notes (Unsigned)
        Established patient visit  History, exam, impression, and plan:  Vaginal odor 2-3 weeks 1st in urine Started period, smelled with tampon removal Switched to cup Sour smell No other symptoms Yes sexually active One partner, female, monogamous  ROS  Physical Exam  Procedures performed this visit: None.  No follow-ups on file.  __________________________________ Thayer Ohm, DNP, APRN, FNP-BC Primary Care and Sports Medicine [facility] [place]

## 2023-09-18 ENCOUNTER — Encounter: Payer: Self-pay | Admitting: Medical-Surgical

## 2023-09-18 LAB — WET PREP FOR TRICH, YEAST, CLUE
Clue Cell Exam: NEGATIVE
Trichomonas Exam: NEGATIVE
Yeast Exam: NEGATIVE

## 2023-09-19 LAB — GC/CHLAMYDIA PROBE AMP
Chlamydia trachomatis, NAA: NEGATIVE
Neisseria Gonorrhoeae by PCR: NEGATIVE

## 2023-09-20 ENCOUNTER — Other Ambulatory Visit: Payer: Self-pay | Admitting: Medical-Surgical

## 2023-09-21 ENCOUNTER — Ambulatory Visit (INDEPENDENT_AMBULATORY_CARE_PROVIDER_SITE_OTHER): Payer: 59 | Admitting: Family Medicine

## 2023-09-21 VITALS — BP 155/97 | HR 64 | Resp 20

## 2023-09-21 DIAGNOSIS — N898 Other specified noninflammatory disorders of vagina: Secondary | ICD-10-CM

## 2023-09-21 DIAGNOSIS — R8290 Unspecified abnormal findings in urine: Secondary | ICD-10-CM | POA: Diagnosis not present

## 2023-09-21 LAB — URINE CULTURE

## 2023-09-21 MED ORDER — NITROFURANTOIN MONOHYD MACRO 100 MG PO CAPS: 100.0000 mg | ORAL_CAPSULE | Freq: Two times a day (BID) | ORAL | 0 refills | Status: DC

## 2023-09-21 NOTE — Progress Notes (Signed)
Patient here today for a NuSwab.   Patient stated that she is having foul smell while on period and now its just her urine that smells.

## 2023-09-21 NOTE — Progress Notes (Signed)
Medical screening examination/treatment was performed by qualified clinical staff member and as supervising physician I was immediately available for consultation/collaboration. I have reviewed documentation and agree with assessment and plan.  Ayme Short, DO  

## 2023-09-21 NOTE — Addendum Note (Signed)
Addended byChristen Butter on: 09/21/2023 01:43 PM   Modules accepted: Orders

## 2023-09-24 LAB — NUSWAB BV AND CANDIDA, NAA
Candida albicans, NAA: NEGATIVE
Candida glabrata, NAA: NEGATIVE

## 2023-10-01 ENCOUNTER — Ambulatory Visit: Payer: 59

## 2023-10-15 ENCOUNTER — Encounter: Payer: Self-pay | Admitting: Medical-Surgical

## 2023-10-15 ENCOUNTER — Ambulatory Visit (INDEPENDENT_AMBULATORY_CARE_PROVIDER_SITE_OTHER): Payer: 59 | Admitting: Medical-Surgical

## 2023-10-15 VITALS — BP 154/94 | HR 67 | Resp 20 | Ht 66.0 in | Wt 181.2 lb

## 2023-10-15 DIAGNOSIS — F411 Generalized anxiety disorder: Secondary | ICD-10-CM | POA: Diagnosis not present

## 2023-10-15 DIAGNOSIS — I10 Essential (primary) hypertension: Secondary | ICD-10-CM | POA: Diagnosis not present

## 2023-10-15 DIAGNOSIS — F909 Attention-deficit hyperactivity disorder, unspecified type: Secondary | ICD-10-CM

## 2023-10-15 DIAGNOSIS — F418 Other specified anxiety disorders: Secondary | ICD-10-CM | POA: Diagnosis not present

## 2023-10-15 MED ORDER — AMPHETAMINE-DEXTROAMPHETAMINE 10 MG PO TABS: 10.0000 mg | ORAL_TABLET | Freq: Two times a day (BID) | ORAL | 0 refills | Status: DC

## 2023-10-15 MED ORDER — SERTRALINE HCL 50 MG PO TABS: 150.0000 mg | ORAL_TABLET | Freq: Every day | ORAL | 1 refills | Status: DC

## 2023-10-15 NOTE — Progress Notes (Signed)
        Established patient visit  History, exam, impression, and plan:  1. Hypertension goal BP (blood pressure) < 130/80 (Primary) Pleasant 38 year old female presenting today with a history of hypertension.  She was previously prescribed hydrochlorothiazide  12.5 mg daily but admits that she stopped taking this because she thought it may be contributing to the worsened anxiety.  She is not currently taking any medications for hypertension management.  She is not checking blood pressures at home and feels that if she did, it would just produce more anxiety.  Blood pressure today is grossly elevated at 170/101.  She feels like this is related to a lot of anxiety around situational stressors since she just found out her dad has cancer.  Discussed recommendations for management of blood pressure and goals for readings at 130/80 or less.  Strongly recommend restarting hydrochlorothiazide  12.5 mg daily and returning in 2 weeks for blood pressure check.  Patient verbalized understanding is agreeable to the plan.  2. Adult ADHD Has been taking Adderall 10 mg twice daily as needed, doing well, no side effects.  Feels the medication is working well for her and has no current complaints.  Plan to continue low-dose Adderall however if blood pressure remains grossly uncontrolled, we will need to discontinue the medication temporarily. - amphetamine -dextroamphetamine  (ADDERALL) 10 MG tablet; Take 1 tablet (10 mg total) by mouth 2 (two) times daily.  Dispense: 60 tablet; Refill: 0  3. Generalized anxiety disorder 4. Depression with anxiety Patient endorses continued issues with severe anxiety and tearfulness.  She is under quite a bit of stress with her job and at home.  Feels that anxiety is the worst of her symptoms.  Had a referral for counseling but did not connect with them and has not been doing so.  On Christmas Day found out that her father has liver and pancreatic masses that are suspected to be cancer.  A  lot of worry about the future and the unknown.  She is currently taking sertraline  100 mg daily.  Not sure that the medication is working well for her or not since that she still has a lot of anxiety.  Interested in GeneSight testing so we will order that today.  Increasing sertraline  to 150 mg daily.  Discontinuing Wellbutrin  as this was not tolerated due to worsened anxiety. - sertraline  (ZOLOFT ) 50 MG tablet; Take 3 tablets (150 mg total) by mouth daily.  Dispense: 270 tablet; Refill: 1   Procedures performed this visit: None.  Return in about 2 weeks (around 10/29/2023) for nurse visit for BP check.  __________________________________ Zada FREDRIK Palin, DNP, APRN, FNP-BC Primary Care and Sports Medicine [facility]

## 2023-10-29 ENCOUNTER — Ambulatory Visit: Payer: 59

## 2023-11-01 ENCOUNTER — Ambulatory Visit (INDEPENDENT_AMBULATORY_CARE_PROVIDER_SITE_OTHER): Payer: 59

## 2023-11-01 ENCOUNTER — Encounter: Payer: Self-pay | Admitting: Medical-Surgical

## 2023-11-01 VITALS — BP 158/95 | HR 69

## 2023-11-01 DIAGNOSIS — I10 Essential (primary) hypertension: Secondary | ICD-10-CM | POA: Diagnosis not present

## 2023-11-01 DIAGNOSIS — Z6829 Body mass index (BMI) 29.0-29.9, adult: Secondary | ICD-10-CM

## 2023-11-01 NOTE — Progress Notes (Signed)
   Established Patient Office Visit  Subjective   Patient ID: GENELL CADWELL, female    DOB: 1985-12-14  Age: 38 y.o. MRN: [id number]  Chief Complaint  Patient presents with   Hypertension    HPI  MABRIE SERRES is here for blood pressure check. Denies chest pain, shortness of breath or dizziness.   ROS    Objective:     BP (!) 158/95   Pulse 69   SpO2 100%    Physical Exam   No results found for any visits on 11/01/23.    The ASCVD Risk score (Arnett DK, et al., 2019) failed to calculate for the following reasons:   The 2019 ASCVD risk score is only valid for ages 13 to 34    Assessment & Plan:  HTN - Per Joy, patient advised to increase hydrochlorothiazide to 25 mg once daily and follow up in 2 weeks on the nurse schedule.   Problem List Items Addressed This Visit       Unprioritized   Hypertension goal BP (blood pressure) < 130/80 - Primary    Return in about 2 weeks (around 11/15/2023) for nurse visit blood pressure check. Earna Coder, Janalyn Harder, CMA

## 2023-11-03 MED ORDER — ZEPBOUND 2.5 MG/0.5ML ~~LOC~~ SOAJ: 2.5000 mg | SUBCUTANEOUS | 0 refills | Status: DC

## 2023-11-09 ENCOUNTER — Telehealth: Payer: Self-pay

## 2023-11-09 NOTE — Telephone Encounter (Signed)
Copied from CRM [id number]. Topic: Clinical - Prescription Issue >> Nov 05, 2023  2:15 PM Monisha R wrote: Reason for CRM:  Patient called in stating the pharmacy needs a Pre Autho from Physician for  tirzepatide (ZEPBOUND) 2.5 MG/0.5ML Pen Please contact pharmacy to give PA!

## 2023-11-15 ENCOUNTER — Ambulatory Visit (INDEPENDENT_AMBULATORY_CARE_PROVIDER_SITE_OTHER): Payer: 59 | Admitting: Medical-Surgical

## 2023-11-15 VITALS — BP 156/113 | HR 74 | Temp 98.8°F | Ht 66.0 in | Wt 179.0 lb

## 2023-11-15 DIAGNOSIS — I10 Essential (primary) hypertension: Secondary | ICD-10-CM | POA: Diagnosis not present

## 2023-11-15 MED ORDER — LOSARTAN POTASSIUM 25 MG PO TABS: 25.0000 mg | ORAL_TABLET | Freq: Every day | ORAL | 0 refills | Status: DC

## 2023-11-15 NOTE — Progress Notes (Signed)
Patient is here for blood pressure check. Denies trouble sleeping, palpitations, dizziness, lightheadedness, blurry vision, chest pain, shortness of breath, headaches and/or medication problems.   Patient's blood pressure was out of goal range 156/104, pulse 74. Patient sat for 15 minutes. Blood pressure recheck was not at goal 156/113, pulse 63. Provider notified of current blood pressure reading. Per provider, patient is to switch to Losartan 25 mg and hydrochlorothiazide 25 MG. Patient has been made aware of the change. New rx pended.  Patient informed to schedule next NV appointment in 2 weeks.

## 2023-11-18 ENCOUNTER — Other Ambulatory Visit: Payer: Self-pay

## 2023-11-18 MED ORDER — HYDROCHLOROTHIAZIDE 12.5 MG PO TABS: 12.5000 mg | ORAL_TABLET | Freq: Every day | ORAL | 0 refills | Status: DC

## 2023-11-18 MED ORDER — LOSARTAN POTASSIUM 25 MG PO TABS: 25.0000 mg | ORAL_TABLET | Freq: Every day | ORAL | 0 refills | Status: DC

## 2023-11-18 NOTE — Telephone Encounter (Signed)
 PA submitted for Zepbound . Response should be sent to both PCP and patient via CMM.

## 2023-11-29 ENCOUNTER — Ambulatory Visit: Payer: 59 | Admitting: Obstetrics and Gynecology

## 2023-11-29 ENCOUNTER — Encounter: Payer: Self-pay | Admitting: Obstetrics and Gynecology

## 2023-11-29 ENCOUNTER — Ambulatory Visit (INDEPENDENT_AMBULATORY_CARE_PROVIDER_SITE_OTHER): Payer: 59 | Admitting: Medical-Surgical

## 2023-11-29 VITALS — BP 145/90 | HR 91 | Ht 66.0 in | Wt 175.0 lb

## 2023-11-29 VITALS — BP 135/81 | HR 78 | Ht 66.0 in | Wt 181.2 lb

## 2023-11-29 DIAGNOSIS — I10 Essential (primary) hypertension: Secondary | ICD-10-CM

## 2023-11-29 DIAGNOSIS — Z3046 Encounter for surveillance of implantable subdermal contraceptive: Secondary | ICD-10-CM

## 2023-11-29 MED ORDER — TRANEXAMIC ACID 650 MG PO TABS: 1300.0000 mg | ORAL_TABLET | Freq: Three times a day (TID) | ORAL | 6 refills | Status: DC

## 2023-11-29 NOTE — Progress Notes (Signed)
 Pt would like Nexplanon out due to irregular bleeding

## 2023-11-29 NOTE — Progress Notes (Signed)
 Medical screening examination/treatment was performed by qualified clinical staff member and as supervising provider I was immediately available for consultation/collaboration. I have reviewed documentation and agree with assessment and plan.  Thayer Ohm, DNP, APRN, FNP-BC [facility] and Sports Medicine

## 2023-11-29 NOTE — Progress Notes (Signed)
     GYNECOLOGY OFFICE PROCEDURE NOTE  Zoe Wiley is a 38 y.o. [phone] here for Nexplanon removal.  Last pap smear was on 2024 and was normal.  No other gynecologic concerns.  Nexplanon Removal Patient identified, informed consent performed, consent signed.   Appropriate time out taken.   Nexplanon site identified.  Area prepped in usual sterile fashon. One ml of 1% lidocaine was used to anesthetize the area at the distal end of the implant. A small stab incision was made right beside the implant on the distal portion.  The Nexplanon rod was grasped using hemostats and removed without difficulty.  There was minimal blood loss. There were no complications.  3 ml of 1% lidocaine was injected around the incision for post-procedure analgesia.  Steri-strips were applied over the small incision.  A pressure bandage was applied to reduce any bruising.    The patient tolerated the procedure well and was given post procedure instructions.  Has tubal for birth control. For bleeding, if periods are heavy again, will do TXA as temporary measure. We discussed checking TVUS and establishing plan from there if menorrhagia again.    Milas Hock, MD, FACOG Obstetrician & Gynecologist, [facility] for [facility], [facility]

## 2023-11-29 NOTE — Progress Notes (Signed)
 HPI  Pt is here today to have BP checked. Last OV bp was 156/113. Denies headaches, SOB, medication problems.                       Assessment and Plan:  Pt BP is stable at 135/81. Pt is to f/u as needed

## 2023-12-15 ENCOUNTER — Telehealth: Payer: Self-pay

## 2023-12-15 MED ORDER — ZEPBOUND 5 MG/0.5ML ~~LOC~~ SOAJ: 5.0000 mg | SUBCUTANEOUS | 0 refills | Status: DC

## 2023-12-15 MED ORDER — TIRZEPATIDE-WEIGHT MANAGEMENT 5 MG/0.5ML ~~LOC~~ SOLN: 5.0000 mg | SUBCUTANEOUS | 1 refills | Status: DC

## 2023-12-15 NOTE — Telephone Encounter (Signed)
 Requesting zepbound increase to 5mg  Last written as 2.4mg  on 11/03/2023 Last OV 10/15/2023 Upcoming appt 12/27/2023

## 2023-12-15 NOTE — Telephone Encounter (Signed)
 Zepbound 5mg  weekly sent to the pharmacy.

## 2023-12-15 NOTE — Telephone Encounter (Signed)
 Copied from CRM [id number]. Topic: Clinical - Medication Question >> Dec 15, 2023 11:39 AM Gaetano Hawthorne wrote: Reason for CRM: Patient was prescribed a 4 week supply of Zepbound - she will be due for next dose on Sunday - she is asking for a new prescription with an increased dose to be sent [facility] on her chart - She's a bit concerned because it took awhile to get the prior authorization and the medication called into the pharmacy last time. Please call the patient with a timeline and status - advised that she would get a call within 24 hours for the next steps.

## 2023-12-15 NOTE — Addendum Note (Signed)
 Addended by: Ardyth Man on: 12/15/2023 03:29 PM   Modules accepted: Orders

## 2023-12-16 NOTE — Telephone Encounter (Signed)
 Zoe Wiley (Key: [id number]) PA Case ID #: [id number] Rx #: [id number] Attempting PA for zepbound increase to 5mg   Question asked is Has the patient lost greater than or equal to 5% of baseline body weight? Please Note: This refers to baseline prior to any glucagon-like peptide-1 (GLP-1) agonist (for example, Saxenda, [id number]) or GLP-1/glucose-dependent insulinotropic polypeptide (GIP)receptor agonist (for example, Zepbound).* Patient has not had a visit since initiation of therapy.  She has an upcoming appt on 12/27/23  but is needing medication PA now.  Can we have patient just return for weight check  nurse only and send information or does she have to wait to be seen at office visit.

## 2023-12-17 NOTE — Telephone Encounter (Signed)
 Zoe Wiley (Key: [id number]) PA Case ID #: [id number] Rx #: [id number] Need Help? Call us at [phone] Outcome Approved today by [employer] 2017 CaseId:[id number];Status:Approved;Review Type:Prior Auth;Coverage Start Date:11/17/2023;Coverage End Date:08/13/2024; Effective Date: 11/16/2023 Authorization Expiration Date: 08/12/2024 Drug Zepbound 5MG /0.5ML solution ePA cloud logo Form [employer] Electronic PA Form [phone] NCPDP) Original Claim Info 75

## 2023-12-27 ENCOUNTER — Ambulatory Visit: Payer: 59 | Admitting: Medical-Surgical

## 2023-12-27 ENCOUNTER — Telehealth: Payer: Self-pay

## 2023-12-27 NOTE — Telephone Encounter (Signed)
 Copied from CRM [id number]. Topic: Appointments - Scheduling Inquiry for Clinic >> Dec 27, 2023  3:58 PM Everette C wrote: Reason for CRM: The patient would like to be contacted by a member of administrative staff when possible to successfully schedule their appointment

## 2023-12-27 NOTE — Telephone Encounter (Signed)
 Would someone be able to assist patient in scheduling an appointment? :)

## 2024-01-07 ENCOUNTER — Ambulatory Visit: Admitting: Medical-Surgical

## 2024-01-12 ENCOUNTER — Encounter: Payer: Self-pay | Admitting: Medical-Surgical

## 2024-01-12 ENCOUNTER — Ambulatory Visit: Admitting: Medical-Surgical

## 2024-01-12 VITALS — BP 129/88 | HR 67 | Resp 20 | Ht 66.0 in | Wt 170.0 lb

## 2024-01-12 DIAGNOSIS — Z6829 Body mass index (BMI) 29.0-29.9, adult: Secondary | ICD-10-CM

## 2024-01-12 DIAGNOSIS — F909 Attention-deficit hyperactivity disorder, unspecified type: Secondary | ICD-10-CM

## 2024-01-12 DIAGNOSIS — I10 Essential (primary) hypertension: Secondary | ICD-10-CM

## 2024-01-12 DIAGNOSIS — F418 Other specified anxiety disorders: Secondary | ICD-10-CM | POA: Diagnosis not present

## 2024-01-12 MED ORDER — HYDROCHLOROTHIAZIDE 12.5 MG PO TABS: 12.5000 mg | ORAL_TABLET | Freq: Every day | ORAL | 3 refills | Status: DC

## 2024-01-12 MED ORDER — ZEPBOUND 5 MG/0.5ML ~~LOC~~ SOAJ: 5.0000 mg | SUBCUTANEOUS | 0 refills | Status: DC

## 2024-01-12 MED ORDER — AMPHETAMINE-DEXTROAMPHETAMINE 10 MG PO TABS: 10.0000 mg | ORAL_TABLET | Freq: Two times a day (BID) | ORAL | 0 refills | Status: DC | PRN

## 2024-01-12 MED ORDER — LOSARTAN POTASSIUM 25 MG PO TABS: 25.0000 mg | ORAL_TABLET | Freq: Every day | ORAL | 3 refills | Status: DC

## 2024-01-12 MED ORDER — ONDANSETRON 4 MG PO TBDP: 4.0000 mg | ORAL_TABLET | Freq: Three times a day (TID) | ORAL | 0 refills | Status: AC | PRN

## 2024-01-12 NOTE — Progress Notes (Signed)
 Established patient visit  History, exam, impression, and plan:  1. Adult ADHD (Primary) Pleasant 38 year old female presenting today for follow up on ADHD. Currently taking instant release Adderall 10mg  daily with a second dose in the afternoon on a very sparing basis. She only takes the second dose if she has a long work day or if other activities are scheduled where she will need the extra focus. Tolerates the medication well w/o SE. No sleep disruption. Feels the medicine is working well and is actively trying to cut down to the lowest dose and frequency possible. Would eventually like to get off the medication if possible. BP well managed and HR normal. Continue Adderall BID PRN.  - amphetamine-dextroamphetamine (ADDERALL) 10 MG tablet; Take 1 tablet (10 mg total) by mouth 2 (two) times daily as needed.  Dispense: 60 tablet; Refill: 0 - amphetamine-dextroamphetamine (ADDERALL) 10 MG tablet; Take 1 tablet (10 mg total) by mouth 2 (two) times daily as needed.  Dispense: 60 tablet; Refill: 0 - amphetamine-dextroamphetamine (ADDERALL) 10 MG tablet; Take 1 tablet (10 mg total) by mouth 2 (two) times daily as needed.  Dispense: 60 tablet; Refill: 0  2. Hypertension goal BP (blood pressure) < 130/80 Currently taking losartan 25 mg and hydrochlorothiazide 12.5 mg daily.  Tolerating both medications well without side effects.  Compliant with dosing.  Has blood pressure cuff at home but has not been monitoring her blood pressure since her last visit here.  Reports that taking her blood pressure for the idea of taking it makes her anxious.  Working to follow a low-sodium diet.  Aiming for physical activity whenever she is able to fit this in her schedule.  Denies concerning symptoms today.  Blood pressure was elevated on arrival however on recheck it is at goal systolically, a little high diastolically but not significantly so.  Plan to continue losartan and hydrochlorothiazide as prescribed.  Would  recommend checking blood pressure 2-3 times weekly at home to make sure her readings stay well-managed. - hydrochlorothiazide (HYDRODIURIL) 12.5 MG tablet; Take 1 tablet (12.5 mg total) by mouth daily.  Dispense: 90 tablet; Refill: 3 - losartan (COZAAR) 25 MG tablet; Take 1 tablet (25 mg total) by mouth daily.  Dispense: 90 tablet; Refill: 3  3. BMI 29.0-29.9,adult Currently taking tirzepatide 5 mg weekly, tolerating well overall but does note some nausea on day 1 and 2 after the injection.  To date she has lost 10 pounds since starting the medication and is very happy with her progress.  Notes that her appetite is reduced and she is able to work on making healthier choices.  Her goal weight is approximately 155-160 pounds and she is confident that she will be able to attain this.  As she is already having some nausea with the first couple of days after the injection, recommend staying at the 5 mg weekly dose for now.  Adding Zofran as needed for nausea. - tirzepatide (ZEPBOUND) 5 MG/0.5ML Pen; Inject 5 mg into the skin once a week.  Dispense: 6 mL; Refill: 0 - ondansetron (ZOFRAN-ODT) 4 MG disintegrating tablet; Take 1 tablet (4 mg total) by mouth every 8 (eight) hours as needed for nausea or vomiting.  Dispense: 20 tablet; Refill: 0  4. Depression with anxiety She is currently taking Zoloft for management of her mood.  Reports that her dose had been increased to 150 mg daily back in January but recently she decided to started cutting back on her dose and  is now on 100 mg daily.  Her ultimate goal is to get off the medication if at all possible.  Denies SI/HI.  Feels that the medication is working well for her.  During the appointment, her mood, affect, thought pattern, and speech were all normal.  Continue Zoloft as prescribed, okay to reduce dosing to least effective.   Procedures performed this visit: None.  Return in about 6 months (around 07/13/2024) for ADHD/mood/hypertension/weight  follow-up.  __________________________________ Thayer Ohm, DNP, APRN, FNP-BC Primary Care and Sports Medicine [facility]

## 2024-02-04 NOTE — Telephone Encounter (Signed)
 Zepbound  has been approved by insurance from 10/19/23 to 07/15/2024

## 2024-04-04 ENCOUNTER — Other Ambulatory Visit: Payer: Self-pay | Admitting: Medical-Surgical

## 2024-04-04 ENCOUNTER — Encounter: Payer: Self-pay | Admitting: Medical-Surgical

## 2024-04-04 DIAGNOSIS — Z6829 Body mass index (BMI) 29.0-29.9, adult: Secondary | ICD-10-CM

## 2024-04-04 DIAGNOSIS — I10 Essential (primary) hypertension: Secondary | ICD-10-CM

## 2024-04-05 MED ORDER — TIRZEPATIDE-WEIGHT MANAGEMENT 7.5 MG/0.5ML ~~LOC~~ SOAJ
7.5000 mg | SUBCUTANEOUS | 0 refills | Status: AC
Start: 2024-05-31 — End: 2024-06-28

## 2024-04-30 ENCOUNTER — Other Ambulatory Visit: Payer: Self-pay | Admitting: Medical-Surgical

## 2024-04-30 DIAGNOSIS — I10 Essential (primary) hypertension: Secondary | ICD-10-CM

## 2024-05-12 ENCOUNTER — Encounter: Payer: Self-pay | Admitting: Medical-Surgical

## 2024-05-15 ENCOUNTER — Telehealth (INDEPENDENT_AMBULATORY_CARE_PROVIDER_SITE_OTHER): Admitting: Medical-Surgical

## 2024-05-15 ENCOUNTER — Encounter: Payer: Self-pay | Admitting: Medical-Surgical

## 2024-05-15 DIAGNOSIS — F411 Generalized anxiety disorder: Secondary | ICD-10-CM | POA: Diagnosis not present

## 2024-05-15 DIAGNOSIS — F32A Depression, unspecified: Secondary | ICD-10-CM

## 2024-05-15 DIAGNOSIS — F418 Other specified anxiety disorders: Secondary | ICD-10-CM

## 2024-05-15 DIAGNOSIS — F410 Panic disorder [episodic paroxysmal anxiety] without agoraphobia: Secondary | ICD-10-CM

## 2024-05-15 MED ORDER — BUSPIRONE HCL 5 MG PO TABS: 5.0000 mg | ORAL_TABLET | Freq: Two times a day (BID) | ORAL | 1 refills | Status: AC | PRN

## 2024-05-15 NOTE — Progress Notes (Signed)
 Virtual Visit via Video Note  I connected with Zoe Wiley on 05/15/24 at  3:40 PM EDT by a video enabled telemedicine application and verified that I am speaking with the correct person using two identifiers.   I discussed the limitations of evaluation and management by telemedicine and the availability of in person appointments. The patient expressed understanding and agreed to proceed.  Patient location: home Provider locations: office  Subjective:    CC: anxiety  HPI: Zoe Wiley is a 38 year old female with anxiety and depression who presents with severe anxiety impacting daily functioning.  For the past two months, she experiences severe anxiety daily, with a sensation of a 'golf ball' in her mouth, making speech difficult. She sometimes feels like she is yelling when she is not. These symptoms are particularly challenging at work, where frequent communication is required. 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 She describes her mind as 'loud' and struggles to catch her breath at times.  She takes Zoloft  150 mg, which is not alleviating her symptoms. She has not tried other specific treatments for her anxiety outside of Zoloft  and Tylenol , which she finds ineffective.   She experiences symptoms of depression, including crying spells, and attributes some of her stress to her father's pancreatic cancer diagnosis. She denies thoughts of self-harm or harm to others.   Past medical history, Surgical history, Family history not pertinant except as noted below, Social history, Allergies, and medications have been entered into the medical record, reviewed, and corrections made.   Review of Systems: See HPI for pertinent positives and negatives.   Objective:    General: Speaking clearly in complete sentences without any shortness of breath.  Alert and oriented x3.  Normal judgment. No apparent acute  distress.  Impression and Recommendations:    Assessment & Plan Generalized anxiety disorder and depression Severe daily anxiety impacting work and personal life with symptoms of difficulty speaking, sensation of a lump in the throat, and feeling overwhelmed. Depression symptoms include crying and feelings of inadequacy. Zoloft  150 mg was effective until recently. No evidence of bipolar disorder. Previous Buspar  trial in 2022 with unknown effectiveness. No current counseling, but plans to initiate. - Prescribed Buspar  5 mg, instructed to take 5-15 mg twice daily as needed. Discussed potential side effects, including tiredness, and advised trying on a non-workday to assess response. Emphasized trial for 2-3 weeks before evaluating effectiveness. - Discussed alternative medications such as Klonopin , Xanax, Lexapro, and Celexa if Buspar  is ineffective. - Ordered GeneSight test to guide future medication adjustments. - Encouraged initiation of counseling services. - Scheduled follow-up in 3-4 weeks to assess response to Buspar  and overall progress.  I discussed the assessment and treatment plan with the patient. The patient was provided an opportunity to ask questions and all were answered. The patient agreed with the plan and demonstrated an understanding of the instructions.   The patient was advised to call back or seek an in-person evaluation if the symptoms worsen or if the condition fails to improve as anticipated.  No follow-ups on file.  Zada FREDRIK Palin, DNP, APRN, FNP-BC [facility] and Sports Medicine

## 2024-05-31 ENCOUNTER — Other Ambulatory Visit: Payer: Self-pay | Admitting: Medical-Surgical

## 2024-05-31 DIAGNOSIS — F418 Other specified anxiety disorders: Secondary | ICD-10-CM

## 2024-07-13 ENCOUNTER — Encounter: Payer: Self-pay | Admitting: Medical-Surgical

## 2024-07-13 ENCOUNTER — Ambulatory Visit: Admitting: Medical-Surgical

## 2024-07-13 VITALS — BP 118/77 | HR 68 | Resp 20 | Ht 66.0 in | Wt 157.0 lb

## 2024-07-13 DIAGNOSIS — I10 Essential (primary) hypertension: Secondary | ICD-10-CM | POA: Diagnosis not present

## 2024-07-13 DIAGNOSIS — F418 Other specified anxiety disorders: Secondary | ICD-10-CM

## 2024-07-13 DIAGNOSIS — E663 Overweight: Secondary | ICD-10-CM | POA: Diagnosis not present

## 2024-07-13 DIAGNOSIS — Z6829 Body mass index (BMI) 29.0-29.9, adult: Secondary | ICD-10-CM | POA: Insufficient documentation

## 2024-07-13 DIAGNOSIS — F909 Attention-deficit hyperactivity disorder, unspecified type: Secondary | ICD-10-CM

## 2024-07-13 MED ORDER — SERTRALINE HCL 50 MG PO TABS
150.0000 mg | ORAL_TABLET | Freq: Every day | ORAL | 3 refills | Status: AC
Start: 2024-07-13 — End: ?

## 2024-07-13 MED ORDER — HYDROCHLOROTHIAZIDE 12.5 MG PO TABS: 12.5000 mg | ORAL_TABLET | Freq: Every day | ORAL | 3 refills | Status: AC

## 2024-07-13 MED ORDER — ZEPBOUND 7.5 MG/0.5ML ~~LOC~~ SOAJ: 7.5000 mg | SUBCUTANEOUS | 0 refills | Status: AC

## 2024-07-13 MED ORDER — AMPHETAMINE-DEXTROAMPHETAMINE 10 MG PO TABS: 10.0000 mg | ORAL_TABLET | Freq: Every day | ORAL | 0 refills | Status: AC | PRN

## 2024-07-13 MED ORDER — LOSARTAN POTASSIUM 25 MG PO TABS: 25.0000 mg | ORAL_TABLET | Freq: Every day | ORAL | 3 refills | Status: AC

## 2024-07-13 NOTE — Assessment & Plan Note (Signed)
 Depression and anxiety well-managed with Buspar  and Zoloft . Buspar  prescription expired.  Denies SI/HI - Continue BuSpar  5-15 mg twice daily as needed. - Continue Zoloft  150 mg daily.

## 2024-07-13 NOTE — Progress Notes (Signed)
 Established patient visit   History of Present Illness   Discussed the use of AI scribe software for clinical note transcription with the patient, who gave verbal consent to proceed.  History of Present Illness   Zoe Wiley is a 38 year old female who presents with itching and medication management.  Attention deficit hyperactivity disorder (adhd) medication management - Currently prescribed Adderall 10 mg twice daily as needed, but has not filled prescription since June - Not taking Adderall regularly - Prefers instant release formulation - Sometimes splits dose between morning and evening - Considering transition to a once-daily regimen  Hypertension medication management - Prescribed losartan  25 mg and hydrochlorothiazide  12.5 mg daily for blood pressure control - Has not taken one of these medications for two weeks due to pharmacy issues but is not sure which one  Weight management therapy - On Zepbound  since March - Finds Zepbound  effective, especially when combined with exercise - Currently taking 5 mg dose - Considering increasing dose to 7.5 mg - Goal weight between 145 and 150 pounds  Anxiety management - Takes Buspar  5 mg once daily at night - Buspar  has improved sense of normalcy  Depression management - Takes Zoloft  150 mg daily - Has sufficient supply for the next two months  Physical Exam   Physical Exam Vitals reviewed.  Constitutional:      General: She is not in acute distress.    Appearance: Normal appearance. She is not ill-appearing.  HENT:     Head: Normocephalic and atraumatic.  Cardiovascular:     Rate and Rhythm: Normal rate and regular rhythm.     Pulses: Normal pulses.     Heart sounds: Normal heart sounds. No murmur heard.    No friction rub. No gallop.  Pulmonary:     Effort: Pulmonary effort is normal. No respiratory distress.     Breath sounds: Normal breath sounds. No wheezing.  Skin:    General: Skin is warm and dry.   Neurological:     Mental Status: She is alert and oriented to person, place, and time.  Psychiatric:        Mood and Affect: Mood normal.        Behavior: Behavior normal.        Thought Content: Thought content normal.        Judgment: Judgment normal.    Assessment & Plan   Problem List Items Addressed This Visit       Cardiovascular and Mediastinum   Hypertension goal BP (blood pressure) < 130/80   Hypertension managed with losartan  and hydrochlorothiazide . Reports not taking one of the medications for two weeks, [facility]. - Continue losartan  25 mg and hydrochlorothiazide  12.5 mg daily.      Relevant Medications   hydrochlorothiazide  (HYDRODIURIL ) 12.5 MG tablet   losartan  (COZAAR ) 25 MG tablet     Other   Adult ADHD - Primary   ADHD managed with Adderall, prefers once daily dosing, currently using instant release formulation. - Change Adderall 10 mg to once daily dosing.      Relevant Medications   amphetamine -dextroamphetamine  (ADDERALL) 10 MG tablet (Start on 09/11/2024)   amphetamine -dextroamphetamine  (ADDERALL) 10 MG tablet (Start on 08/12/2024)   amphetamine -dextroamphetamine  (ADDERALL) 10 MG tablet   Depression with anxiety   Depression and anxiety well-managed with Buspar  and Zoloft . Buspar  prescription expired.  Denies SI/HI - Continue BuSpar  5-15 mg twice daily as needed. - Continue Zoloft  150 mg  daily.      Relevant Medications   sertraline  (ZOLOFT ) 50 MG tablet   Overweight with body mass index (BMI) of 29 to 29.9 in adult   Obesity managed with Zepbound  since March. Reports significant improvement in mental health. Currently on 5 mg dose, considering increase to 7.5 mg.  Weight loss to date 24 pounds using the medication in conjunction with dietary modifications and regular intentional exercise. - Increase Zepbound  to 7.5 mg. - Discuss tapering strategy for Zepbound  when approaching goal weight.        Follow up   Return in  about 6 months (around 01/11/2025) for ADHD follow up. __________________________________ Zada FREDRIK Palin, DNP, APRN, FNP-BC Primary Care and Sports Medicine [facility]

## 2024-07-13 NOTE — Assessment & Plan Note (Signed)
 ADHD managed with Adderall, prefers once daily dosing, currently using instant release formulation. - Change Adderall 10 mg to once daily dosing.

## 2024-07-13 NOTE — Assessment & Plan Note (Signed)
 Obesity managed with Zepbound  since March. Reports significant improvement in mental health. Currently on 5 mg dose, considering increase to 7.5 mg.  Weight loss to date 24 pounds using the medication in conjunction with dietary modifications and regular intentional exercise. - Increase Zepbound  to 7.5 mg. - Discuss tapering strategy for Zepbound  when approaching goal weight.

## 2024-07-13 NOTE — Assessment & Plan Note (Signed)
 Hypertension managed with losartan  and hydrochlorothiazide . Reports not taking one of the medications for two weeks, [facility]. - Continue losartan  25 mg and hydrochlorothiazide  12.5 mg daily.

## 2024-07-21 ENCOUNTER — Telehealth: Payer: Self-pay

## 2024-07-21 ENCOUNTER — Other Ambulatory Visit (HOSPITAL_COMMUNITY): Payer: Self-pay

## 2024-07-21 NOTE — Telephone Encounter (Signed)
 Pharmacy Patient Advocate Encounter   Received notification from CoverMyMeds that prior authorization for Ozempic (0.25 or 0.5 MG/DOSE) 2MG /3ML pen-injectors is due for renewal.   Insurance verification completed.   The patient is insured through [employer].  Action: PA required and submitted KEY/EOC/Request #: [id number] from 06/21/24 to 07/21/25   *RENEWAL*
